# Patient Record
Sex: Female | Born: 1980 | Hispanic: Yes | Marital: Single | Smoking: Never smoker
Health system: Southern US, Community
[De-identification: ages and names within clinical notes are randomized; demographics above are authoritative.]

## PROBLEM LIST (undated history)

## (undated) ENCOUNTER — Inpatient Hospital Stay (HOSPITAL_COMMUNITY): Payer: Self-pay

## (undated) DIAGNOSIS — K219 Gastro-esophageal reflux disease without esophagitis: Secondary | ICD-10-CM

## (undated) HISTORY — PX: NO PAST SURGERIES: SHX2092

---

## 2011-02-18 ENCOUNTER — Encounter: Payer: Self-pay | Admitting: *Deleted

## 2011-02-18 ENCOUNTER — Emergency Department (INDEPENDENT_AMBULATORY_CARE_PROVIDER_SITE_OTHER): Payer: Self-pay

## 2011-02-18 ENCOUNTER — Emergency Department (HOSPITAL_BASED_OUTPATIENT_CLINIC_OR_DEPARTMENT_OTHER)
Admission: EM | Admit: 2011-02-18 | Discharge: 2011-02-18 | Disposition: A | Discharge status: Home / Self Care | Payer: Self-pay | Attending: Emergency Medicine | Admitting: Emergency Medicine

## 2011-02-18 DIAGNOSIS — W208XXA Other cause of strike by thrown, projected or falling object, initial encounter: Secondary | ICD-10-CM | POA: Insufficient documentation

## 2011-02-18 DIAGNOSIS — W19XXXA Unspecified fall, initial encounter: Secondary | ICD-10-CM

## 2011-02-18 DIAGNOSIS — S5000XA Contusion of unspecified elbow, initial encounter: Secondary | ICD-10-CM | POA: Insufficient documentation

## 2011-02-18 DIAGNOSIS — R609 Edema, unspecified: Secondary | ICD-10-CM

## 2011-02-18 DIAGNOSIS — M25529 Pain in unspecified elbow: Secondary | ICD-10-CM

## 2011-02-18 DIAGNOSIS — S5001XA Contusion of right elbow, initial encounter: Secondary | ICD-10-CM

## 2011-02-18 DIAGNOSIS — S20219A Contusion of unspecified front wall of thorax, initial encounter: Secondary | ICD-10-CM

## 2011-02-18 LAB — PREGNANCY, URINE: Preg Test, Ur: NEGATIVE

## 2011-02-18 MED ORDER — HYDROCODONE-ACETAMINOPHEN 5-325 MG PO TABS: 1.0000 | ORAL_TABLET | Freq: Four times a day (QID) | ORAL | Status: AC | PRN

## 2011-02-18 NOTE — ED Notes (Signed)
Pt fell approx 12hours ago, while sitting on a glass table that broke. Pt c/o right elbow pain.

## 2011-02-18 NOTE — ED Provider Notes (Signed)
History     CSN: [id number] Arrival date & time: 02/18/2011  1:58 AM  Chief Complaint  Patient presents with  . Elbow Injury   HPI This is a 30 year old Hispanic female who fell yesterday afternoon about 2 PM when the table collapsed. She is complaining of pain in her right elbow laterally as well as pain in her right upper back. She states she has a visible bruise on her right upper back. The pain her elbow is moderate to severe and worse with palpation or movement. The pain in her back is less severe. She denies other injury. She has been applying heat to the elbow without relief.  History reviewed. No pertinent past medical history.  History reviewed. No pertinent past surgical history.  No family history on file.  History  Substance Use Topics  . Smoking status: Never Smoker   . Smokeless tobacco: Not on file  . Alcohol Use: Yes     rarely    OB History    Grav Para Term Preterm Abortions TAB SAB Ect Mult Living                  Review of Systems  All other systems reviewed and are negative.    Physical Exam  BP 108/67  Pulse 73  Temp(Src) 97.9 F (36.6 C) (Oral)  Resp 18  SpO2 100%  LMP 12/20/2010  Physical Exam General: Well-developed, well-nourished female in no acute distress; appearance consistent with age of record HENT: normocephalic, atraumatic Eyes: pupils equal round and reactive to light; extraocular muscles intact Neck: supple; C-spine nontender Back: Right upper back ecchymosis with tenderness; no crepitus Heart: regular rate and rhythm Lungs: clear to auscultation bilaterally Abdomen: Soft nondistended Extremities: No deformity; full range of motion; tenderness over lateral aspect of right elbow without deformity; there is some mild edema; right upper extremity is distally neurovascularly intact Neurologic: Awake, alert and oriented;motor function intact in all extremities and symmetric Skin: Warm and dry Psychiatric: Normal mood and  affect   ED Course  Procedures  MDM Nursing notes and vitals signs, including pulse oximetry, reviewed.  Summary of this visit's results, reviewed by myself:  Labs:  Results for orders placed during the hospital encounter of 02/18/11  PREGNANCY, URINE      Component Value Range   Preg Test, Ur NEGATIVE      Imaging Studies: Dg Elbow Complete Right  02/18/2011  *RADIOLOGY REPORT*  Clinical Data: Right elbow pain, swelling, and bruising after fall 12 hours ago on glass table.  RIGHT ELBOW - COMPLETE 3+ VIEW  Comparison: None.  Findings: No evidence of acute fracture or subluxation.  No focal bone lesions.  Bone matrix and cortex appear intact.  No abnormal radiopaque densities in the soft tissues.  No significant effusion  IMPRESSION: No acute bony abnormalities demonstrated in the right elbow.  Original Report Authenticated By: Marlon Pel, M.D.          Hanley Seamen, MD 02/18/11 [phone]

## 2011-07-04 ENCOUNTER — Emergency Department (HOSPITAL_COMMUNITY)
Admission: EM | Admit: 2011-07-04 | Discharge: 2011-07-04 | Discharge status: Against Medical Advice | Payer: Self-pay | Attending: Emergency Medicine | Admitting: Emergency Medicine

## 2011-07-04 ENCOUNTER — Other Ambulatory Visit: Payer: Self-pay

## 2011-07-04 ENCOUNTER — Encounter (HOSPITAL_COMMUNITY): Payer: Self-pay | Admitting: *Deleted

## 2011-07-04 DIAGNOSIS — N63 Unspecified lump in unspecified breast: Secondary | ICD-10-CM | POA: Insufficient documentation

## 2011-07-04 DIAGNOSIS — N644 Mastodynia: Secondary | ICD-10-CM | POA: Insufficient documentation

## 2011-07-04 DIAGNOSIS — R0602 Shortness of breath: Secondary | ICD-10-CM | POA: Insufficient documentation

## 2011-09-07 ENCOUNTER — Other Ambulatory Visit: Payer: Self-pay | Admitting: Family Medicine

## 2011-09-07 DIAGNOSIS — N63 Unspecified lump in unspecified breast: Secondary | ICD-10-CM

## 2011-09-14 ENCOUNTER — Ambulatory Visit
Admission: RE | Admit: 2011-09-14 | Discharge: 2011-09-14 | Disposition: A | Discharge status: Home / Self Care | Payer: Self-pay | Source: Ambulatory Visit | Attending: Family Medicine | Admitting: Family Medicine

## 2011-09-14 DIAGNOSIS — N63 Unspecified lump in unspecified breast: Secondary | ICD-10-CM

## 2011-09-14 NOTE — Discharge Instructions (Signed)
Breast Biopsy Care After These instructions give you information on caring for yourself after your procedure. Your doctor may also give you more specific instructions. Call your doctor if you have any problems or questions after your procedure. HOME CARE  Change any bandages (dressings) as told by your doctor.   Only take sponge baths during the first 2 to 3 days. Keep your bandage dry.   Wear a bra all day and night until you see your doctor again.   Eat a healthy diet.   Do not exercise, drive, lift, or do activities without your doctor's permission.   Only take medicine as told by your doctor. Do not take aspirin. Do not drink alcohol while taking pain medicine.   Protect the biopsy area. Do not let the area get bumped.   Keep all follow-up appointments.  Finding out the results of your test Ask when your test results will be ready. Make sure you get your test results. GET HELP RIGHT AWAY IF:   You have trouble breathing.   You have redness and puffiness (swelling) around the biopsy site.   You notice a bad smell coming from the biopsy site.   You are bleeding more from your biopsy site.   You have yellowish white fluid (pus) coming from the biopsy site.   The biopsy site is opening.   You get a rash.   You need stronger pain medicine.   You think you are reacting badly to your medicine.   You have a fever.  MAKE SURE YOU:  Understand these instructions.   Will watch your condition.   Will get help right away if you are not doing well or get worse.  Document Released: 04/01/2009 Document Revised: 05/25/2011 Document Reviewed: 04/01/2009 [employer] Patient Information 2012 [employer]. 

## 2012-03-17 ENCOUNTER — Inpatient Hospital Stay (HOSPITAL_COMMUNITY)
Admission: AD | Admit: 2012-03-17 | Discharge: 2012-03-17 | Disposition: A | Discharge status: Home / Self Care | Payer: Medicaid Other | Source: Ambulatory Visit | Attending: Obstetrics & Gynecology | Admitting: Obstetrics & Gynecology

## 2012-03-17 ENCOUNTER — Encounter (HOSPITAL_COMMUNITY): Payer: Self-pay | Admitting: *Deleted

## 2012-03-17 DIAGNOSIS — O99891 Other specified diseases and conditions complicating pregnancy: Secondary | ICD-10-CM | POA: Insufficient documentation

## 2012-03-17 DIAGNOSIS — R109 Unspecified abdominal pain: Secondary | ICD-10-CM | POA: Insufficient documentation

## 2012-03-17 DIAGNOSIS — O26899 Other specified pregnancy related conditions, unspecified trimester: Secondary | ICD-10-CM

## 2012-03-17 LAB — URINALYSIS, ROUTINE W REFLEX MICROSCOPIC
Bilirubin Urine: NEGATIVE
Ketones, ur: NEGATIVE mg/dL
Leukocytes, UA: NEGATIVE
Nitrite: NEGATIVE
Specific Gravity, Urine: >1.03 — ABNORMAL HIGH (ref 1.005–1.030)
Urobilinogen, UA: 0.2 mg/dL (ref 0.0–1.0)
pH: 6 (ref 5.0–8.0)

## 2012-03-17 LAB — CBC WITH DIFFERENTIAL/PLATELET
Basophils Absolute: 0 10*3/uL (ref 0.0–0.1)
Basophils Relative: 0 % (ref 0–1)
Eosinophils Absolute: 0.1 10*3/uL (ref 0.0–0.7)
MCH: 28.4 pg (ref 26.0–34.0)
MCHC: 33.8 g/dL (ref 30.0–36.0)
Neutro Abs: 5.5 10*3/uL (ref 1.7–7.7)
Neutrophils Relative %: 59 % (ref 43–77)
Platelets: 257 10*3/uL (ref 150–400)
RDW: 13.7 % (ref 11.5–15.5)

## 2012-03-17 LAB — WET PREP, GENITAL
Clue Cells Wet Prep HPF POC: NONE SEEN
Trich, Wet Prep: NONE SEEN

## 2012-03-17 LAB — URINE MICROSCOPIC-ADD ON

## 2012-03-17 NOTE — MAU Provider Note (Signed)
History     CSN: [id number]  Arrival date and time: 03/17/12 2017   None     Chief Complaint  Patient presents with  . Abdominal Pain   HPI Victoria Schneider is a 31 y.o. female @ [redacted]w[redacted]d gestation who presents to MAU with abdominal pain.  The pain started a few weeks ago. She describes the pain as feeling like a period pain. She rates the pain as 5/10. The pain comes and goes. Associated symptoms include nausea and vomiting in the mornings and heart burn.  Denies vaginal bleeding or discharge. Last sexual intercourse 2 months ago. No history of STI's.  Last pap smear less than one year ago and was normal. Planning prenatal care at [facility] but has not started yet. The history was provided by the patient.  OB History    Grav Para Term Preterm Abortions TAB SAB Ect Mult Living   3 2 2       2       History reviewed. No pertinent past medical history.  History reviewed. No pertinent past surgical history.  History reviewed. No pertinent family history.  History  Substance Use Topics  . Smoking status: Never Smoker   . Smokeless tobacco: Not on file  . Alcohol Use: No     rarely    Allergies: No Known Allergies  Prescriptions prior to admission  Medication Sig Dispense Refill  . acetaminophen (TYLENOL) 325 MG tablet Take 650 mg by mouth every 6 (six) hours as needed. For pain/headache      . calcium carbonate (TUMS - DOSED IN MG ELEMENTAL CALCIUM) 500 MG chewable tablet Chew 1 tablet by mouth daily. For heartburn      . Prenatal Vit-Fe Fumarate-FA (PRENATAL MULTIVITAMIN) TABS Take 1 tablet by mouth daily.        Review of Systems  Constitutional: Negative for fever, chills and weight loss.  HENT: Negative for ear pain, nosebleeds, congestion, sore throat and neck pain.   Eyes: Negative for blurred vision, double vision, photophobia and pain.  Respiratory: Negative for cough, shortness of breath and wheezing.   Cardiovascular: Negative for chest pain, palpitations and leg  swelling.  Gastrointestinal: Positive for heartburn, nausea, vomiting and abdominal pain. Negative for diarrhea and constipation.  Genitourinary: Negative for dysuria, urgency and frequency.  Musculoskeletal: Negative for myalgias and back pain.  Skin: Negative for itching and rash.  Neurological: Negative for dizziness, sensory change, speech change, seizures, weakness and headaches.  Endo/Heme/Allergies: Does not bruise/bleed easily.  Psychiatric/Behavioral: Negative for depression. The patient is not nervous/anxious and does not have insomnia.    Physical Exam   Temperature 98.7 F (37.1 C), temperature source Oral, resp. rate 18, height 5' 5.5" (1.664 m), weight 183 lb (83.008 kg), last menstrual period 12/19/2011.  Physical Exam  Nursing note and vitals reviewed. Constitutional: She is oriented to person, place, and time. She appears well-developed and well-nourished. No distress.  HENT:  Head: Normocephalic and atraumatic.  Eyes: EOM are normal.  Neck: Neck supple.  Cardiovascular: Normal rate.   Respiratory: Effort normal.  GI: Soft. There is tenderness.       Mildly tender bilateral abdomen, no guarding or rebound. Positive FHT's.  Genitourinary:       External genitalia without lesions. White discharge vaginal vault. Cervix closed, long, no CMT. Uterus 12 to 14 week size.    Musculoskeletal: Normal range of motion.  Neurological: She is alert and oriented to person, place, and time.  Skin: Skin is warm and  dry.  Psychiatric: She has a normal mood and affect. Her behavior is normal. Judgment and thought content normal.   Procedures  Results for orders placed during the hospital encounter of 03/17/12 (from the past 24 hour(s))  URINALYSIS, ROUTINE W REFLEX MICROSCOPIC     Status: Abnormal   Collection Time   03/17/12  8:26 PM      Component Value Range   Color, Urine YELLOW  YELLOW   APPearance CLEAR  CLEAR   Specific Gravity, Urine >1.030 (*) 1.005 - 1.030   pH 6.0   5.0 - 8.0   Glucose, UA NEGATIVE  NEGATIVE mg/dL   Hgb urine dipstick SMALL (*) NEGATIVE   Bilirubin Urine NEGATIVE  NEGATIVE   Ketones, ur NEGATIVE  NEGATIVE mg/dL   Protein, ur NEGATIVE  NEGATIVE mg/dL   Urobilinogen, UA 0.2  0.0 - 1.0 mg/dL   Nitrite NEGATIVE  NEGATIVE   Leukocytes, UA NEGATIVE  NEGATIVE  URINE MICROSCOPIC-ADD ON     Status: Normal   Collection Time   03/17/12  8:26 PM      Component Value Range   Squamous Epithelial / LPF RARE  RARE   RBC / HPF 0-2  <3 RBC/hpf   Bacteria, UA RARE  RARE   Urine-Other MUCOUS PRESENT    POCT PREGNANCY, URINE     Status: Abnormal   Collection Time   03/17/12  8:32 PM      Component Value Range   Preg Test, Ur POSITIVE (*) NEGATIVE  WET PREP, GENITAL     Status: Abnormal   Collection Time   03/17/12  9:42 PM      Component Value Range   Yeast Wet Prep HPF POC NONE SEEN  NONE SEEN   Trich, Wet Prep NONE SEEN  NONE SEEN   Clue Cells Wet Prep HPF POC NONE SEEN  NONE SEEN   WBC, Wet Prep HPF POC TOO NUMEROUS TO COUNT (*) NONE SEEN  CBC WITH DIFFERENTIAL     Status: Abnormal   Collection Time   03/17/12  9:54 PM      Component Value Range   WBC 9.5  4.0 - 10.5 K/uL   RBC 4.15  3.87 - 5.11 MIL/uL   Hemoglobin 11.8 (*) 12.0 - 15.0 g/dL   HCT 16.1 (*) 09.6 - 04.5 %   MCV 84.1  78.0 - 100.0 fL   MCH 28.4  26.0 - 34.0 pg   MCHC 33.8  30.0 - 36.0 g/dL   RDW 40.9  81.1 - 91.4 %   Platelets 257  150 - 400 K/uL   Neutrophils Relative 59  43 - 77 %   Neutro Abs 5.5  1.7 - 7.7 K/uL   Lymphocytes Relative 31  12 - 46 %   Lymphs Abs 3.0  0.7 - 4.0 K/uL   Monocytes Relative 9  3 - 12 %   Monocytes Absolute 0.9  0.1 - 1.0 K/uL   Eosinophils Relative 1  0 - 5 %   Eosinophils Absolute 0.1  0.0 - 0.7 K/uL   Basophils Relative 0  0 - 1 %   Basophils Absolute 0.0  0.0 - 0.1 K/uL   Assessment: 31 y.o. female @ [redacted]w[redacted]d gestation with abdominal pain   Round ligament pain  Plan:  Tylenol as needed for discomfort   Continue prenatal care at  [facility], return as needed  Informal bedside ultrasound shows active IUP. Patient able to visualize cardiac activity.  Discussed with the patient and  all questioned fully answered. She will return here if any problems arise.   Medication List     As of 03/19/2012  8:23 AM    CONTINUE taking these medications         acetaminophen 325 MG tablet   Commonly known as: TYLENOL      calcium carbonate 500 MG chewable tablet   Commonly known as: TUMS - dosed in mg elemental calcium      prenatal multivitamin Tabs         NEESE,HOPE, RN, FNP, BC 03/17/2012, 10:45 PM

## 2012-03-17 NOTE — MAU Note (Signed)
Stomachl pain like "im going to start my period" no vaginal bleeding

## 2012-03-19 LAB — GC/CHLAMYDIA PROBE AMP, GENITAL
Chlamydia, DNA Probe: NEGATIVE
GC Probe Amp, Genital: NEGATIVE

## 2012-03-19 NOTE — MAU Provider Note (Signed)
Medical Screening exam and patient care preformed by advanced practice provider.  Agree with the above management.  

## 2012-06-19 NOTE — L&D Delivery Note (Signed)
Delivery Note At 4:39 AM a viable female was delivered via  (Presentation: ;  ).  APGAR: , ; weight .   Placenta status: , .  Cord:  with the following complications: .  Cord pH: not done  Anesthesia: Epidural  Episiotomy:  Lacerations:  Suture Repair: 2.0 vicryl Est. Blood Loss (mL):   Mom to postpartum.  Baby to nursery-stable.  Shawnna Pancake A 10/01/2012, 4:47 AM

## 2012-07-10 ENCOUNTER — Encounter (HOSPITAL_COMMUNITY): Payer: Self-pay | Admitting: *Deleted

## 2012-07-10 ENCOUNTER — Inpatient Hospital Stay (HOSPITAL_COMMUNITY)
Admission: AD | Admit: 2012-07-10 | Discharge: 2012-07-11 | Disposition: A | Discharge status: Home / Self Care | Payer: Medicaid Other | Source: Ambulatory Visit | Attending: Obstetrics & Gynecology | Admitting: Obstetrics & Gynecology

## 2012-07-10 DIAGNOSIS — O2390 Unspecified genitourinary tract infection in pregnancy, unspecified trimester: Secondary | ICD-10-CM | POA: Insufficient documentation

## 2012-07-10 DIAGNOSIS — B373 Candidiasis of vulva and vagina: Secondary | ICD-10-CM

## 2012-07-10 DIAGNOSIS — B3731 Acute candidiasis of vulva and vagina: Secondary | ICD-10-CM | POA: Insufficient documentation

## 2012-07-10 DIAGNOSIS — N949 Unspecified condition associated with female genital organs and menstrual cycle: Secondary | ICD-10-CM | POA: Insufficient documentation

## 2012-07-10 DIAGNOSIS — L293 Anogenital pruritus, unspecified: Secondary | ICD-10-CM | POA: Insufficient documentation

## 2012-07-10 MED ORDER — FLUCONAZOLE 150 MG PO TABS: 150.0000 mg | ORAL_TABLET | Freq: Once | ORAL | Status: DC

## 2012-07-10 MED ORDER — FLUCONAZOLE 150 MG PO TABS
150.0000 mg | ORAL_TABLET | ORAL | Status: AC
  Administered 2012-07-11: 150 mg via ORAL
  Filled 2012-07-10: qty 1

## 2012-07-10 NOTE — Progress Notes (Signed)
Pt states she feels pressure with the baby movements

## 2012-07-10 NOTE — MAU Note (Signed)
Pt states she has discharge that started yesterday and pt states she has some discomfort and now has itching. Pt also states she has not felt the baby move"that much for about  3 days "

## 2012-07-10 NOTE — MAU Provider Note (Signed)
Chief Complaint:  Vaginal Discharge   First Provider Initiated Contact with Patient 07/10/12 2334      HPI: Victoria Schneider is a 32 y.o. G3P2002 at [redacted]w[redacted]d who presents to maternity admissions reporting vaginal discharge with itching and decreased fetal movement over last 3 days.  She reports good fetal movement since arrival in MAU, denies LOF, vaginal bleeding, urinary symptoms, h/a, dizziness, n/v, or fever/chills.     Past Medical History: History reviewed. No pertinent past medical history.  Past obstetric history: OB History    Grav Para Term Preterm Abortions TAB SAB Ect Mult Living   3 2 2       2      # Outc Date GA Lbr Len/2nd Wgt Sex Del Anes PTL Lv   1 TRM      SVD      2 TRM      SVD      3 CUR               Past Surgical History: History reviewed. No pertinent past surgical history.  Family History: History reviewed. No pertinent family history.  Social History: History  Substance Use Topics  . Smoking status: Never Smoker   . Smokeless tobacco: Not on file  . Alcohol Use: No     Comment: rarely    Allergies: No Known Allergies  Meds:  Prescriptions prior to admission  Medication Sig Dispense Refill  . acetaminophen (TYLENOL) 325 MG tablet Take 650 mg by mouth every 6 (six) hours as needed. For pain/headache      . calcium carbonate (TUMS - DOSED IN MG ELEMENTAL CALCIUM) 500 MG chewable tablet Chew 1 tablet by mouth daily. For heartburn      . Prenatal Vit-Fe Fumarate-FA (PRENATAL MULTIVITAMIN) TABS Take 1 tablet by mouth daily.        ROS: Pertinent findings in history of present illness.  Physical Exam  Blood pressure 107/59, pulse 85, temperature 97.9 F (36.6 C), temperature source Oral, resp. rate 20, height 5\' 3"  (1.6 m), weight 87.544 kg (193 lb), last menstrual period 12/19/2011, SpO2 100.00%. GENERAL: Well-developed, well-nourished female in no acute distress.  HEENT: normocephalic HEART: normal rate RESP: normal effort ABDOMEN: Soft,  non-tender, gravid appropriate for gestational age EXTREMITIES: Nontender, no edema NEURO: alert and oriented Pelvic exam: Cervix pink, visually closed, without lesion, moderate amount white clumpy discharge clinging to vaginal walls, vaginal walls and external genitalia with mild erythema Cervix 0/long/high, posterior    FHT:  Baseline 135 , moderate variability, accelerations present (15x15), no decelerations Contractions: None on toco or by palpation   Labs: Results for orders placed during the hospital encounter of 07/10/12 (from the past 24 hour(s))  WET PREP, GENITAL     Status: Abnormal   Collection Time   07/10/12 11:44 PM      Component Value Range   Yeast Wet Prep HPF POC NONE SEEN  NONE SEEN   Trich, Wet Prep NONE SEEN  NONE SEEN   Clue Cells Wet Prep HPF POC FEW (*) NONE SEEN   WBC, Wet Prep HPF POC FEW (*) NONE SEEN      Assessment: 1. Vaginal candida     Plan: Diflucan x1 dose in MAU Topical clotrimazole in MAU and pt to take home for external use Discharge home Labor precautions and fetal kick counts F/U with Dr Tamela Oddi as scheduled in February Return to MAU as needed    Medication List     As of 07/10/2012  11:44 PM    ASK your doctor about these medications         acetaminophen 325 MG tablet   Commonly known as: TYLENOL   Take 650 mg by mouth every 6 (six) hours as needed. For pain/headache      calcium carbonate 500 MG chewable tablet   Commonly known as: TUMS - dosed in mg elemental calcium   Chew 1 tablet by mouth daily. For heartburn      prenatal multivitamin Tabs   Take 1 tablet by mouth daily.        Sharen Counter Certified Nurse-Midwife 07/10/2012 11:44 PM

## 2012-07-10 NOTE — MAU Note (Signed)
Pt reports vaginal "pain" yesterday and today has vaginal discharge and vaginal itching. Reports discharge is "clear, white". Also reports baby is moving less over the last few days.

## 2012-07-11 DIAGNOSIS — B373 Candidiasis of vulva and vagina: Secondary | ICD-10-CM

## 2012-07-11 LAB — WET PREP, GENITAL
Trich, Wet Prep: NONE SEEN
Yeast Wet Prep HPF POC: NONE SEEN

## 2012-07-11 MED ORDER — CLOTRIMAZOLE 1 % EX CREA
TOPICAL_CREAM | Freq: Once | CUTANEOUS | Status: DC
  Filled 2012-07-11: qty 15

## 2012-07-15 ENCOUNTER — Inpatient Hospital Stay (HOSPITAL_COMMUNITY)
Admission: AD | Admit: 2012-07-15 | Discharge: 2012-07-16 | Disposition: A | Discharge status: Home / Self Care | Payer: Medicaid Other | Source: Ambulatory Visit | Attending: Obstetrics & Gynecology | Admitting: Obstetrics & Gynecology

## 2012-07-15 ENCOUNTER — Encounter (HOSPITAL_COMMUNITY): Payer: Self-pay

## 2012-07-15 DIAGNOSIS — K219 Gastro-esophageal reflux disease without esophagitis: Secondary | ICD-10-CM | POA: Insufficient documentation

## 2012-07-15 DIAGNOSIS — R109 Unspecified abdominal pain: Secondary | ICD-10-CM | POA: Insufficient documentation

## 2012-07-15 DIAGNOSIS — O99891 Other specified diseases and conditions complicating pregnancy: Secondary | ICD-10-CM | POA: Insufficient documentation

## 2012-07-15 MED ORDER — FAMOTIDINE 20 MG PO TABS
20.0000 mg | ORAL_TABLET | Freq: Once | ORAL | Status: AC
  Administered 2012-07-16: 20 mg via ORAL
  Filled 2012-07-15: qty 1

## 2012-07-15 MED ORDER — GI COCKTAIL ~~LOC~~
30.0000 mL | Freq: Once | ORAL | Status: AC
  Administered 2012-07-15: 30 mL via ORAL
  Filled 2012-07-15: qty 30

## 2012-07-15 MED ORDER — ONDANSETRON 8 MG PO TBDP
8.0000 mg | ORAL_TABLET | Freq: Once | ORAL | Status: AC
  Administered 2012-07-15: 8 mg via ORAL
  Filled 2012-07-15: qty 1

## 2012-07-15 NOTE — MAU Note (Signed)
Pt given GI cocktail. Pt immediately vomited. Dr. Paulina Fusi notified

## 2012-07-15 NOTE — MAU Provider Note (Signed)
Chief Complaint:  Nausea and reflux    First Provider Initiated Contact with Patient 07/15/12 2307      HPI: Victoria Schneider is a 32 y.o. G3P2002 at [redacted]w[redacted]d who presents to maternity admissions reporting worsening abdominal pain in the epigastric region when eating.  Pt states this began a couple day ago and she presented to MAU at that time and dx with reflux.  Pt continued to have these symptoms including epigastric pain, burning 1-2 hrs after eating since then and has tried to tums to no avail.  Also, pt is unable to determine if pain is worse when laying down and does not c/o brash in the back of her mouth.  Denies CP, SOB, dizziness.  Denies contractions, leakage of fluid or vaginal bleeding. Good fetal movement.  Past Medical History: History reviewed. No pertinent past medical history.  Past obstetric history: OB History    Grav Para Term Preterm Abortions TAB SAB Ect Mult Living   3 2 2       2      # Outc Date GA Lbr Len/2nd Wgt Sex Del Anes PTL Lv   1 TRM      SVD      2 TRM      SVD      3 CUR               Past Surgical History: No past surgical history on file.  Family History: No family history on file.  Social History: History  Substance Use Topics  . Smoking status: Never Smoker   . Smokeless tobacco: Not on file  . Alcohol Use: No     Comment: rarely    Allergies: No Known Allergies  Meds:  Prescriptions prior to admission  Medication Sig Dispense Refill  . acetaminophen (TYLENOL) 325 MG tablet Take 650 mg by mouth every 6 (six) hours as needed. For pain/headache      . calcium carbonate (TUMS - DOSED IN MG ELEMENTAL CALCIUM) 500 MG chewable tablet Chew 1 tablet by mouth daily. For heartburn      . Prenatal Vit-Fe Fumarate-FA (PRENATAL MULTIVITAMIN) TABS Take 1 tablet by mouth daily.        ROS: Pertinent findings in history of present illness.  Physical Exam  Last menstrual period 12/19/2011. GENERAL: Well-developed, well-nourished female in no  acute distress.  HEENT: normocephalic HEART: RRR RESP: CTAB ABDOMEN: TTP epigastric region, Soft, gravid appropriate for gestational age EXTREMITIES: Nontender, no edema NEURO: alert and oriented  Labs: No results found for this or any previous visit (from the past 24 hour(s)).  Imaging:  No results found. MAU Course: 1) Will give GI cocktail and see how this works   1) Pt vomited this up, will give Zofran ODT 8 mg x 1 and also Pepcid AC x 1. Pt did well with this and will d/c home.    Assessment: 1. Gastroesophageal reflux in pregancy     Plan: Discharge home Labor precautions and fetal kick counts Will give Pepcid for PPx during pregnancy     Medication List     As of 07/16/2012 12:43 AM    TAKE these medications         acetaminophen 325 MG tablet   Commonly known as: TYLENOL   Take 650 mg by mouth every 6 (six) hours as needed. For pain/headache      calcium carbonate 500 MG chewable tablet   Commonly known as: TUMS - dosed in mg elemental calcium  Chew 1 tablet by mouth daily. For heartburn      famotidine 10 MG chewable tablet   Commonly known as: PEPCID AC   Chew 1 tablet (10 mg total) by mouth 2 (two) times daily.      prenatal multivitamin Tabs   Take 1 tablet by mouth daily.         Briscoe Deutscher, DO 07/15/2012 11:07 PM  I have seen the patient with the resident and agree with the above.

## 2012-07-16 DIAGNOSIS — K219 Gastro-esophageal reflux disease without esophagitis: Secondary | ICD-10-CM

## 2012-07-16 DIAGNOSIS — O9989 Other specified diseases and conditions complicating pregnancy, childbirth and the puerperium: Secondary | ICD-10-CM

## 2012-07-16 MED ORDER — FAMOTIDINE 10 MG PO CHEW: 10.0000 mg | CHEWABLE_TABLET | Freq: Two times a day (BID) | ORAL | Status: DC

## 2012-07-16 NOTE — MAU Provider Note (Signed)
Attestation of Attending Supervision of Advanced Practitioner (CNM/NP): Evaluation and management procedures were performed by the Advanced Practitioner under my supervision and collaboration. I have reviewed the Advanced Practitioner's note and chart, and I agree with the management and plan.  Tarren Sabree H. 4:44 AM

## 2012-07-25 ENCOUNTER — Other Ambulatory Visit: Payer: Self-pay | Admitting: Obstetrics & Gynecology

## 2012-07-25 DIAGNOSIS — Z0374 Encounter for suspected problem with fetal growth ruled out: Secondary | ICD-10-CM

## 2012-08-01 ENCOUNTER — Ambulatory Visit (HOSPITAL_COMMUNITY): Admission: RE | Admit: 2012-08-01 | Payer: Medicaid Other | Source: Ambulatory Visit

## 2012-08-08 ENCOUNTER — Ambulatory Visit (HOSPITAL_COMMUNITY)
Admission: RE | Admit: 2012-08-08 | Discharge: 2012-08-08 | Disposition: A | Discharge status: Home / Self Care | Payer: Medicaid Other | Source: Ambulatory Visit | Attending: Obstetrics & Gynecology | Admitting: Obstetrics & Gynecology

## 2012-08-08 ENCOUNTER — Other Ambulatory Visit: Payer: Self-pay | Admitting: Obstetrics & Gynecology

## 2012-08-08 DIAGNOSIS — O365990 Maternal care for other known or suspected poor fetal growth, unspecified trimester, not applicable or unspecified: Secondary | ICD-10-CM | POA: Insufficient documentation

## 2012-08-08 DIAGNOSIS — Z3689 Encounter for other specified antenatal screening: Secondary | ICD-10-CM | POA: Insufficient documentation

## 2012-08-08 DIAGNOSIS — Z0374 Encounter for suspected problem with fetal growth ruled out: Secondary | ICD-10-CM

## 2012-08-26 ENCOUNTER — Inpatient Hospital Stay (HOSPITAL_COMMUNITY)
Admission: AD | Admit: 2012-08-26 | Discharge: 2012-08-26 | Disposition: A | Discharge status: Home / Self Care | Payer: Medicaid Other | Source: Ambulatory Visit | Attending: Obstetrics & Gynecology | Admitting: Obstetrics & Gynecology

## 2012-08-26 ENCOUNTER — Encounter (HOSPITAL_COMMUNITY): Payer: Self-pay

## 2012-08-26 DIAGNOSIS — R109 Unspecified abdominal pain: Secondary | ICD-10-CM | POA: Insufficient documentation

## 2012-08-26 DIAGNOSIS — O212 Late vomiting of pregnancy: Secondary | ICD-10-CM | POA: Insufficient documentation

## 2012-08-26 DIAGNOSIS — A084 Viral intestinal infection, unspecified: Secondary | ICD-10-CM

## 2012-08-26 DIAGNOSIS — R197 Diarrhea, unspecified: Secondary | ICD-10-CM | POA: Insufficient documentation

## 2012-08-26 DIAGNOSIS — O99891 Other specified diseases and conditions complicating pregnancy: Secondary | ICD-10-CM | POA: Insufficient documentation

## 2012-08-26 DIAGNOSIS — A088 Other specified intestinal infections: Secondary | ICD-10-CM | POA: Insufficient documentation

## 2012-08-26 LAB — COMPREHENSIVE METABOLIC PANEL
ALT: 10 U/L (ref 0–35)
AST: 31 U/L (ref 0–37)
Albumin: 2.7 g/dL — ABNORMAL LOW (ref 3.5–5.2)
Calcium: 9.2 mg/dL (ref 8.4–10.5)
Creatinine, Ser: 0.46 mg/dL — ABNORMAL LOW (ref 0.50–1.10)
Sodium: 132 mEq/L — ABNORMAL LOW (ref 135–145)

## 2012-08-26 LAB — URINE MICROSCOPIC-ADD ON

## 2012-08-26 LAB — URINALYSIS, ROUTINE W REFLEX MICROSCOPIC
Bilirubin Urine: NEGATIVE
Glucose, UA: NEGATIVE mg/dL
Ketones, ur: 40 mg/dL — AB
Nitrite: NEGATIVE
Specific Gravity, Urine: 1.015 (ref 1.005–1.030)
pH: 6 (ref 5.0–8.0)

## 2012-08-26 LAB — CBC
MCH: 25.1 pg — ABNORMAL LOW (ref 26.0–34.0)
MCV: 78.8 fL (ref 78.0–100.0)
Platelets: 263 10*3/uL (ref 150–400)
RDW: 13.8 % (ref 11.5–15.5)
WBC: 10 10*3/uL (ref 4.0–10.5)

## 2012-08-26 MED ORDER — ONDANSETRON HCL 4 MG PO TABS: 4.0000 mg | ORAL_TABLET | Freq: Every day | ORAL | Status: DC | PRN

## 2012-08-26 MED ORDER — ONDANSETRON HCL 4 MG/2ML IJ SOLN
4.0000 mg | INTRAMUSCULAR | Status: AC
  Administered 2012-08-26: 4 mg via INTRAVENOUS
  Filled 2012-08-26: qty 2

## 2012-08-26 MED ORDER — PROMETHAZINE HCL 25 MG PO TABS: 12.5000 mg | ORAL_TABLET | Freq: Four times a day (QID) | ORAL | Status: DC | PRN

## 2012-08-26 MED ORDER — PROMETHAZINE HCL 25 MG/ML IJ SOLN
25.0000 mg | INTRAVENOUS | Status: AC
  Administered 2012-08-26: 25 mg via INTRAVENOUS
  Filled 2012-08-26: qty 1

## 2012-08-26 MED ORDER — LOPERAMIDE HCL 2 MG PO TABS: 2.0000 mg | ORAL_TABLET | Freq: Four times a day (QID) | ORAL | Status: DC | PRN

## 2012-08-26 NOTE — MAU Note (Signed)
Patient states she has been having abdominal pain, diarrhea and vomiting since 1100, multiple episodes. Reports good fetal movement, no bleeding, contractions or leaking.

## 2012-08-26 NOTE — MAU Provider Note (Signed)
Chief Complaint:  Abdominal Pain, Emesis and Diarrhea   First Provider Initiated Contact with Patient 08/26/12 1811      HPI: Victoria Schneider is a 32 y.o. G3P2002 at [redacted]w[redacted]d who presents to maternity admissions reporting abdominal cramping, n/v, and diarrhea x24 hours.  She reports emesis ~10 times in last 24 hours with 4-5 episodes of diarrhea.  She reports abdominal cramping occurred first, but reports this does not feel like uterine contractions, but more like bowel pain because she is sick. She reports good fetal movement, denies LOF, vaginal bleeding, vaginal itching/burning, urinary symptoms, h/a, dizziness, or fever/chills.  She also denies recent exposure to illness or other family members with similar symptoms.    Past Medical History: No past medical history on file.  Past obstetric history: OB History   Grav Para Term Preterm Abortions TAB SAB Ect Mult Living   3 2 2       2      # Outc Date GA Lbr Len/2nd Wgt Sex Del Anes PTL Lv   1 TRM      SVD      2 TRM      SVD      3 CUR               Past Surgical History: No past surgical history on file.  Family History: No family history on file.  Social History: History  Substance Use Topics  . Smoking status: Never Smoker   . Smokeless tobacco: Not on file  . Alcohol Use: No     Comment: rarely    Allergies: No Known Allergies  Meds:  Prescriptions prior to admission  Medication Sig Dispense Refill  . acetaminophen (TYLENOL) 500 MG tablet Take 1,000 mg by mouth every 6 (six) hours as needed for pain (For headache.).      Marland Kitchen calcium carbonate (TUMS - DOSED IN MG ELEMENTAL CALCIUM) 500 MG chewable tablet Chew 1 tablet by mouth daily. For heartburn      . pantoprazole (PROTONIX) 40 MG tablet Take 40 mg by mouth daily.      . Prenatal Vit-Fe Fumarate-FA (PRENATAL MULTIVITAMIN) TABS Take 1 tablet by mouth daily.        ROS: Pertinent findings in history of present illness.  Physical Exam  Blood pressure 111/66,  pulse 92, temperature 98 F (36.7 C), temperature source Oral, resp. rate 20, height 5\' 4"  (1.626 m), weight 87.272 kg (192 lb 6.4 oz), last menstrual period 12/19/2011, SpO2 100.00%. GENERAL: Well-developed, well-nourished female in no acute distress.  HEENT: normocephalic HEART: normal rate RESP: normal effort ABDOMEN: Soft, non-tender, gravid appropriate for gestational age EXTREMITIES: Nontender, no edema NEURO: alert and oriented SPECULUM EXAM: Deferred      FHT:  Baseline 135 , moderate variability, accelerations present, no decelerations Contractions: occasional, palpate mild   Labs: Results for orders placed during the hospital encounter of 08/26/12 (from the past 24 hour(s))  URINALYSIS, ROUTINE W REFLEX MICROSCOPIC     Status: Abnormal   Collection Time    08/26/12  5:30 PM      Result Value Range   Color, Urine YELLOW  YELLOW   APPearance CLEAR  CLEAR   Specific Gravity, Urine 1.015  1.005 - 1.030   pH 6.0  5.0 - 8.0   Glucose, UA NEGATIVE  NEGATIVE mg/dL   Hgb urine dipstick NEGATIVE  NEGATIVE   Bilirubin Urine NEGATIVE  NEGATIVE   Ketones, ur 40 (*) NEGATIVE mg/dL   Protein, ur NEGATIVE  NEGATIVE mg/dL   Urobilinogen, UA 0.2  0.0 - 1.0 mg/dL   Nitrite NEGATIVE  NEGATIVE   Leukocytes, UA TRACE (*) NEGATIVE  URINE MICROSCOPIC-ADD ON     Status: Abnormal   Collection Time    08/26/12  5:30 PM      Result Value Range   Squamous Epithelial / LPF MANY (*) RARE   WBC, UA 0-2  <3 WBC/hpf   Bacteria, UA FEW (*) RARE   Urine-Other FEW YEAST    CBC     Status: Abnormal   Collection Time    08/26/12  6:15 PM      Result Value Range   WBC 10.0  4.0 - 10.5 K/uL   RBC 3.87  3.87 - 5.11 MIL/uL   Hemoglobin 9.7 (*) 12.0 - 15.0 g/dL   HCT 57.8 (*) 46.9 - 62.9 %   MCV 78.8  78.0 - 100.0 fL   MCH 25.1 (*) 26.0 - 34.0 pg   MCHC 31.8  30.0 - 36.0 g/dL   RDW 52.8  41.3 - 24.4 %   Platelets 263  150 - 400 K/uL  COMPREHENSIVE METABOLIC PANEL     Status: Abnormal    Collection Time    08/26/12  6:15 PM      Result Value Range   Sodium 132 (*) 135 - 145 mEq/L   Potassium 3.7  3.5 - 5.1 mEq/L   Chloride 101  96 - 112 mEq/L   CO2 21  19 - 32 mEq/L   Glucose, Bld 76  70 - 99 mg/dL   BUN 4 (*) 6 - 23 mg/dL   Creatinine, Ser 0.10 (*) 0.50 - 1.10 mg/dL   Calcium 9.2  8.4 - 27.2 mg/dL   Total Protein 7.3  6.0 - 8.3 g/dL   Albumin 2.7 (*) 3.5 - 5.2 g/dL   AST 31  0 - 37 U/L   ALT 10  0 - 35 U/L   Alkaline Phosphatase 198 (*) 39 - 117 U/L   Total Bilirubin 0.5  0.3 - 1.2 mg/dL   GFR calc non Af Amer >90  >90 mL/min   GFR calc Af Amer >90  >90 mL/min     Assessment: 1. Viral gastroenteritis     Plan: D5LR x1000 ml with Phenergan 25 mg, Zofran 4 mg IV in MAU Discharge home PTL precautions Phenergan 12.5-25 mg PO, Zofran 4 mg PO, Imodium sent to pt pharmacy Drink plenty of fluids F/U with Tamela Oddi Return to MAU as needed     Medication List    ASK your doctor about these medications       acetaminophen 500 MG tablet  Commonly known as:  TYLENOL  Take 1,000 mg by mouth every 6 (six) hours as needed for pain (For headache.).     calcium carbonate 500 MG chewable tablet  Commonly known as:  TUMS - dosed in mg elemental calcium  Chew 1 tablet by mouth daily. For heartburn     pantoprazole 40 MG tablet  Commonly known as:  PROTONIX  Take 40 mg by mouth daily.     prenatal multivitamin Tabs  Take 1 tablet by mouth daily.        Sharen Counter Certified Nurse-Midwife 08/26/2012 6:46 PM

## 2012-09-04 ENCOUNTER — Ambulatory Visit: Payer: Medicaid Other | Admitting: Obstetrics & Gynecology

## 2012-09-04 DIAGNOSIS — Z3480 Encounter for supervision of other normal pregnancy, unspecified trimester: Secondary | ICD-10-CM

## 2012-09-04 LAB — POCT URINALYSIS DIPSTICK: Spec Grav, UA: <=1.005

## 2012-09-12 ENCOUNTER — Encounter: Payer: Medicaid Other | Admitting: Obstetrics & Gynecology

## 2012-09-26 ENCOUNTER — Encounter: Payer: Medicaid Other | Admitting: Obstetrics & Gynecology

## 2012-09-26 DIAGNOSIS — Z3480 Encounter for supervision of other normal pregnancy, unspecified trimester: Secondary | ICD-10-CM

## 2012-09-26 DIAGNOSIS — Z3490 Encounter for supervision of normal pregnancy, unspecified, unspecified trimester: Secondary | ICD-10-CM | POA: Insufficient documentation

## 2012-09-30 ENCOUNTER — Encounter (HOSPITAL_COMMUNITY): Payer: Self-pay | Admitting: *Deleted

## 2012-09-30 ENCOUNTER — Inpatient Hospital Stay (HOSPITAL_COMMUNITY)
Admission: AD | Admit: 2012-09-30 | Discharge: 2012-10-03 | DRG: 775 | Disposition: A | Discharge status: Home / Self Care | Payer: Medicaid Other | Source: Ambulatory Visit | Attending: Obstetrics | Admitting: Obstetrics

## 2012-09-30 DIAGNOSIS — D509 Iron deficiency anemia, unspecified: Secondary | ICD-10-CM | POA: Diagnosis present

## 2012-09-30 DIAGNOSIS — O9902 Anemia complicating childbirth: Principal | ICD-10-CM | POA: Diagnosis present

## 2012-09-30 LAB — CBC
Platelets: 222 10*3/uL (ref 150–400)
RBC: 3.97 MIL/uL (ref 3.87–5.11)
RDW: 15.3 % (ref 11.5–15.5)
WBC: 7.7 10*3/uL (ref 4.0–10.5)

## 2012-09-30 LAB — POCT FERN TEST: POCT Fern Test: POSITIVE

## 2012-09-30 MED ORDER — LIDOCAINE HCL (PF) 1 % IJ SOLN
30.0000 mL | INTRAMUSCULAR | Status: DC | PRN
  Filled 2012-09-30: qty 30

## 2012-09-30 MED ORDER — EPHEDRINE 5 MG/ML INJ
10.0000 mg | INTRAVENOUS | Status: DC | PRN
  Filled 2012-09-30: qty 2

## 2012-09-30 MED ORDER — LACTATED RINGERS IV SOLN
INTRAVENOUS | Status: DC
  Administered 2012-09-30: 22:00:00 via INTRAVENOUS

## 2012-09-30 MED ORDER — PHENYLEPHRINE 40 MCG/ML (10ML) SYRINGE FOR IV PUSH (FOR BLOOD PRESSURE SUPPORT)
80.0000 ug | PREFILLED_SYRINGE | INTRAVENOUS | Status: DC | PRN
  Filled 2012-09-30: qty 5
  Filled 2012-09-30: qty 2

## 2012-09-30 MED ORDER — ACETAMINOPHEN 325 MG PO TABS: 650.0000 mg | ORAL_TABLET | ORAL | Status: DC | PRN

## 2012-09-30 MED ORDER — PHENYLEPHRINE 40 MCG/ML (10ML) SYRINGE FOR IV PUSH (FOR BLOOD PRESSURE SUPPORT)
80.0000 ug | PREFILLED_SYRINGE | INTRAVENOUS | Status: DC | PRN
  Filled 2012-09-30: qty 2

## 2012-09-30 MED ORDER — ONDANSETRON HCL 4 MG/2ML IJ SOLN: 4.0000 mg | Freq: Four times a day (QID) | INTRAMUSCULAR | Status: DC | PRN

## 2012-09-30 MED ORDER — FENTANYL 2.5 MCG/ML BUPIVACAINE 1/10 % EPIDURAL INFUSION (WH - ANES)
14.0000 mL/h | INTRAMUSCULAR | Status: DC | PRN
  Administered 2012-10-01: 14 mL/h via EPIDURAL
  Filled 2012-09-30: qty 125

## 2012-09-30 MED ORDER — FLEET ENEMA 7-19 GM/118ML RE ENEM: 1.0000 | ENEMA | RECTAL | Status: DC | PRN

## 2012-09-30 MED ORDER — OXYTOCIN 40 UNITS IN LACTATED RINGERS INFUSION - SIMPLE MED
1.0000 m[IU]/min | INTRAVENOUS | Status: DC
  Administered 2012-09-30: 2 m[IU]/min via INTRAVENOUS
  Filled 2012-09-30 (×2): qty 1000

## 2012-09-30 MED ORDER — TERBUTALINE SULFATE 1 MG/ML IJ SOLN: 0.2500 mg | Freq: Once | INTRAMUSCULAR | Status: AC | PRN

## 2012-09-30 MED ORDER — LACTATED RINGERS IV SOLN: 500.0000 mL | INTRAVENOUS | Status: DC | PRN

## 2012-09-30 MED ORDER — IBUPROFEN 600 MG PO TABS
600.0000 mg | ORAL_TABLET | Freq: Four times a day (QID) | ORAL | Status: DC | PRN
  Administered 2012-10-01: 600 mg via ORAL
  Filled 2012-09-30: qty 1

## 2012-09-30 MED ORDER — CITRIC ACID-SODIUM CITRATE 334-500 MG/5ML PO SOLN: 30.0000 mL | ORAL | Status: DC | PRN

## 2012-09-30 MED ORDER — OXYCODONE-ACETAMINOPHEN 5-325 MG PO TABS
1.0000 | ORAL_TABLET | ORAL | Status: DC | PRN
  Administered 2012-10-01: 1 via ORAL
  Filled 2012-09-30: qty 1

## 2012-09-30 MED ORDER — DIPHENHYDRAMINE HCL 50 MG/ML IJ SOLN: 12.5000 mg | INTRAMUSCULAR | Status: DC | PRN

## 2012-09-30 MED ORDER — EPHEDRINE 5 MG/ML INJ
10.0000 mg | INTRAVENOUS | Status: DC | PRN
  Filled 2012-09-30: qty 2
  Filled 2012-09-30: qty 4

## 2012-09-30 MED ORDER — OXYTOCIN BOLUS FROM INFUSION
500.0000 mL | INTRAVENOUS | Status: DC
  Administered 2012-10-01: 500 mL via INTRAVENOUS

## 2012-09-30 MED ORDER — DEXTROSE 5 % IV SOLN
2.5000 10*6.[IU] | INTRAVENOUS | Status: DC
  Administered 2012-10-01: 2.5 10*6.[IU] via INTRAVENOUS
  Filled 2012-09-30 (×4): qty 2.5

## 2012-09-30 MED ORDER — PENICILLIN G POTASSIUM 5000000 UNITS IJ SOLR
5.0000 10*6.[IU] | Freq: Once | INTRAVENOUS | Status: AC
  Administered 2012-09-30: 5 10*6.[IU] via INTRAVENOUS
  Filled 2012-09-30: qty 5

## 2012-09-30 MED ORDER — BUTORPHANOL TARTRATE 1 MG/ML IJ SOLN: 1.0000 mg | INTRAMUSCULAR | Status: DC | PRN

## 2012-09-30 MED ORDER — OXYTOCIN 40 UNITS IN LACTATED RINGERS INFUSION - SIMPLE MED: 62.5000 mL/h | INTRAVENOUS | Status: DC

## 2012-09-30 MED ORDER — LACTATED RINGERS IV SOLN
500.0000 mL | Freq: Once | INTRAVENOUS | Status: AC
  Administered 2012-10-01: 500 mL via INTRAVENOUS

## 2012-09-30 NOTE — MAU Note (Signed)
Pt in c/o occasional contraction and vaginal itching.  Reports having a watery, thin discharge since yesterday,  States multiple times has had wet underwear.  Went to doctor yesterday, but did not have cervix checked.  States leaking started after appt.  Reports vaginal itching since yesterday.  Denies any bleeding.  Decreased fetal movement today, has felt movement since at MAU.

## 2012-09-30 NOTE — MAU Provider Note (Signed)
History     CSN: [id number]  Arrival date and time: 09/30/12 2033   First Provider Initiated Contact with Patient 09/30/12 2109      Chief Complaint  Patient presents with  . Contractions  . Vaginal Itching   HPI  Victoria Schneider is a 32 y.o. G3P2002 at [redacted]w[redacted]d who presents today with vaginal itching and clear watery discharge since yesterday. She is also having some irregular UCs and some pain in the right side. She thinks the pain on the right is the baby moving.   Past Medical History  Diagnosis Date  . Medical history non-contributory     Past Surgical History  Procedure Laterality Date  . No past surgeries      History reviewed. No pertinent family history.  History  Substance Use Topics  . Smoking status: Never Smoker   . Smokeless tobacco: Not on file  . Alcohol Use: No     Comment: rarely    Allergies: No Known Allergies  Prescriptions prior to admission  Medication Sig Dispense Refill  . acetaminophen (TYLENOL) 500 MG tablet Take 1,000 mg by mouth every 6 (six) hours as needed for pain (For headache.).      Marland Kitchen calcium carbonate (TUMS - DOSED IN MG ELEMENTAL CALCIUM) 500 MG chewable tablet Chew 1 tablet by mouth daily. For heartburn      . diphenhydrAMINE (SOMINEX) 25 MG tablet Take 25 mg by mouth at bedtime as needed for sleep.      . pantoprazole (PROTONIX) 40 MG tablet Take 40 mg by mouth daily.      . Prenatal Vit-Fe Fumarate-FA (PRENATAL MULTIVITAMIN) TABS Take 1 tablet by mouth daily.      . [DISCONTINUED] loperamide (IMODIUM A-D) 2 MG tablet Take 1 tablet (2 mg total) by mouth 4 (four) times daily as needed for diarrhea or loose stools.  15 tablet  0  . [DISCONTINUED] ondansetron (ZOFRAN) 4 MG tablet Take 1 tablet (4 mg total) by mouth daily as needed for nausea.  30 tablet  1    Review of Systems  Constitutional: Negative for fever and chills.  Eyes: Negative for blurred vision.  Respiratory: Negative for shortness of breath.   Cardiovascular:  Negative for chest pain.  Gastrointestinal: Negative for nausea, vomiting, diarrhea and constipation.  Genitourinary: Positive for frequency. Negative for dysuria and urgency.  Musculoskeletal: Negative for myalgias.  Neurological: Negative for dizziness and headaches.   Physical Exam   Blood pressure 133/75, pulse 88, temperature 98.1 F (36.7 C), temperature source Oral, resp. rate 18, height 5\' 5"  (1.651 m), weight 205 lb (92.987 kg), last menstrual period 12/19/2011.  Physical Exam  Nursing note and vitals reviewed. Constitutional: She is oriented to person, place, and time. She appears well-developed and well-nourished. No distress.  Cardiovascular: Normal rate.   Respiratory: Effort normal.  GI: Soft.  Neurological: She is alert and oriented to person, place, and time.  Skin: Skin is warm and dry.  Psychiatric: She has a normal mood and affect.    MAU Course  Procedures  Results for orders placed during the hospital encounter of 09/30/12 (from the past 24 hour(s))  POCT FERN TEST     Status: None   Collection Time    09/30/12  9:30 PM      Result Value Range   POCT Fern Test Positive = ruptured amniotic membanes    CBC     Status: Abnormal   Collection Time    09/30/12 10:25 PM  Result Value Range   WBC 7.7  4.0 - 10.5 K/uL   RBC 3.97  3.87 - 5.11 MIL/uL   Hemoglobin 9.5 (*) 12.0 - 15.0 g/dL   HCT 16.1 (*) 09.6 - 04.5 %   MCV 76.1 (*) 78.0 - 100.0 fL   MCH 23.9 (*) 26.0 - 34.0 pg   MCHC 31.5  30.0 - 36.0 g/dL   RDW 40.9  81.1 - 91.4 %   Platelets 222  150 - 400 K/uL     Assessment and Plan  Admit to labor and delivery  Tawnya Crook 09/30/2012, 9:09 PM

## 2012-10-01 ENCOUNTER — Encounter: Payer: Medicaid Other | Admitting: Obstetrics

## 2012-10-01 ENCOUNTER — Encounter (HOSPITAL_COMMUNITY): Payer: Self-pay | Admitting: Obstetrics

## 2012-10-01 ENCOUNTER — Inpatient Hospital Stay (HOSPITAL_COMMUNITY): Payer: Medicaid Other | Admitting: Anesthesiology

## 2012-10-01 ENCOUNTER — Other Ambulatory Visit: Payer: Medicaid Other

## 2012-10-01 ENCOUNTER — Encounter (HOSPITAL_COMMUNITY): Payer: Self-pay | Admitting: Anesthesiology

## 2012-10-01 LAB — RAPID HIV SCREEN (WH-MAU): Rapid HIV Screen: NONREACTIVE

## 2012-10-01 LAB — TYPE AND SCREEN: Antibody Screen: NEGATIVE

## 2012-10-01 LAB — RPR: RPR Ser Ql: NONREACTIVE

## 2012-10-01 LAB — HEPATITIS B SURFACE ANTIGEN: Hepatitis B Surface Ag: NEGATIVE

## 2012-10-01 LAB — RUBELLA SCREEN: Rubella: 3.87 Index — ABNORMAL HIGH (ref <0.90)

## 2012-10-01 MED ORDER — LANOLIN HYDROUS EX OINT: TOPICAL_OINTMENT | CUTANEOUS | Status: DC | PRN

## 2012-10-01 MED ORDER — LIDOCAINE HCL (PF) 1 % IJ SOLN
INTRAMUSCULAR | Status: DC | PRN
  Administered 2012-10-01 (×2): 5 mL

## 2012-10-01 MED ORDER — DIPHENHYDRAMINE HCL 25 MG PO CAPS: 25.0000 mg | ORAL_CAPSULE | Freq: Four times a day (QID) | ORAL | Status: DC | PRN

## 2012-10-01 MED ORDER — FERROUS SULFATE 325 (65 FE) MG PO TABS
325.0000 mg | ORAL_TABLET | Freq: Two times a day (BID) | ORAL | Status: DC
  Administered 2012-10-01 – 2012-10-03 (×4): 325 mg via ORAL
  Filled 2012-10-01 (×4): qty 1

## 2012-10-01 MED ORDER — PRENATAL MULTIVITAMIN CH
1.0000 | ORAL_TABLET | Freq: Every day | ORAL | Status: DC
  Administered 2012-10-01 – 2012-10-02 (×2): 1 via ORAL
  Filled 2012-10-01 (×2): qty 1

## 2012-10-01 MED ORDER — ONDANSETRON HCL 4 MG/2ML IJ SOLN: 4.0000 mg | INTRAMUSCULAR | Status: DC | PRN

## 2012-10-01 MED ORDER — IBUPROFEN 600 MG PO TABS
600.0000 mg | ORAL_TABLET | Freq: Four times a day (QID) | ORAL | Status: DC
  Administered 2012-10-01 – 2012-10-03 (×8): 600 mg via ORAL
  Filled 2012-10-01 (×8): qty 1

## 2012-10-01 MED ORDER — SIMETHICONE 80 MG PO CHEW: 80.0000 mg | CHEWABLE_TABLET | ORAL | Status: DC | PRN

## 2012-10-01 MED ORDER — TETANUS-DIPHTH-ACELL PERTUSSIS 5-2.5-18.5 LF-MCG/0.5 IM SUSP
0.5000 mL | Freq: Once | INTRAMUSCULAR | Status: AC
  Administered 2012-10-02: 0.5 mL via INTRAMUSCULAR
  Filled 2012-10-01: qty 0.5

## 2012-10-01 MED ORDER — OXYCODONE-ACETAMINOPHEN 5-325 MG PO TABS
1.0000 | ORAL_TABLET | ORAL | Status: DC | PRN
  Administered 2012-10-01 – 2012-10-02 (×5): 1 via ORAL
  Administered 2012-10-02: 2 via ORAL
  Administered 2012-10-02 – 2012-10-03 (×3): 1 via ORAL
  Filled 2012-10-01 (×2): qty 1
  Filled 2012-10-01: qty 2
  Filled 2012-10-01 (×6): qty 1

## 2012-10-01 MED ORDER — ZOLPIDEM TARTRATE 5 MG PO TABS: 5.0000 mg | ORAL_TABLET | Freq: Every evening | ORAL | Status: DC | PRN

## 2012-10-01 MED ORDER — DIBUCAINE 1 % RE OINT
1.0000 "application " | TOPICAL_OINTMENT | RECTAL | Status: DC | PRN
  Administered 2012-10-02: 1 via RECTAL
  Filled 2012-10-01: qty 28

## 2012-10-01 MED ORDER — SENNOSIDES-DOCUSATE SODIUM 8.6-50 MG PO TABS
2.0000 | ORAL_TABLET | Freq: Every day | ORAL | Status: DC
  Administered 2012-10-01 – 2012-10-02 (×2): 2 via ORAL

## 2012-10-01 MED ORDER — BENZOCAINE-MENTHOL 20-0.5 % EX AERO
1.0000 "application " | INHALATION_SPRAY | CUTANEOUS | Status: DC | PRN
  Administered 2012-10-01: 1 via TOPICAL
  Filled 2012-10-01: qty 56

## 2012-10-01 MED ORDER — ONDANSETRON HCL 4 MG PO TABS: 4.0000 mg | ORAL_TABLET | ORAL | Status: DC | PRN

## 2012-10-01 MED ORDER — WITCH HAZEL-GLYCERIN EX PADS: 1.0000 "application " | MEDICATED_PAD | CUTANEOUS | Status: DC | PRN

## 2012-10-01 NOTE — Progress Notes (Signed)
UR completed 

## 2012-10-01 NOTE — Anesthesia Postprocedure Evaluation (Signed)
Anesthesia Post Note  Patient: Victoria Schneider  Procedure(s) Performed: * No procedures listed *  Anesthesia type: Epidural  Patient location: Mother/Baby  Post pain: Pain level controlled  Post assessment: Post-op Vital signs reviewed  Last Vitals: BP 125/79  Pulse 72  Temp(Src) 36.6 C (Axillary)  Resp 18  Ht 5\' 5"  (1.651 m)  Wt 205 lb (92.987 kg)  BMI 34.11 kg/m2  SpO2 98%  LMP 12/19/2011  Post vital signs: Reviewed  Level of consciousness: awake  Complications: No apparent anesthesia complications

## 2012-10-01 NOTE — Anesthesia Procedure Notes (Signed)
Epidural Patient location during procedure: OB Start time: 10/01/2012 1:16 AM  Staffing Anesthesiologist: Angus Seller., Harrell Gave. Performed by: anesthesiologist   Preanesthetic Checklist Completed: patient identified, site marked, surgical consent, pre-op evaluation, timeout performed, IV checked, risks and benefits discussed and monitors and equipment checked  Epidural Patient position: sitting Prep: site prepped and draped and DuraPrep Patient monitoring: continuous pulse ox and blood pressure Approach: midline Injection technique: LOR air and LOR saline  Needle:  Needle type: Tuohy  Needle gauge: 17 G Needle length: 9 cm and 9 Needle insertion depth: 5 cm cm Catheter type: closed end flexible Catheter size: 19 Gauge Catheter at skin depth: 10 cm Test dose: negative  Assessment Events: blood not aspirated, injection not painful, no injection resistance, negative IV test and no paresthesia  Additional Notes Patient identified.  Risk benefits discussed including failed block, incomplete pain control, headache, nerve damage, paralysis, blood pressure changes, nausea, vomiting, reactions to medication both toxic or allergic, and postpartum back pain.  Patient expressed understanding and wished to proceed.  All questions were answered.  Sterile technique used throughout procedure and epidural site dressed with sterile barrier dressing. No paresthesia or other complications noted.The patient did not experience any signs of intravascular injection such as tinnitus or metallic taste in mouth nor signs of intrathecal spread such as rapid motor block. Please see nursing notes for vital signs.

## 2012-10-01 NOTE — H&P (Signed)
This is Dr. Francoise Ceo dictating the history and physical on  Victoria Schneider she's a 32 year old gravida 3 para 2002 at 40 weeks and 2 days who was admitted the night 09/30/2012 with leaking fluid since 09/29/2012 and contracting she states the leakage started at 7:30 PM on Sunday but she did not think it was of significance so she came in on the 14th because leakage would not stop her GBS is unknown prenatal labs unavailable and she was treated with penicillin cervix 3 cm 80% vertex -1 and she was started on Pitocin Past medical history negative Past surgical history negative Social history negative System review noncontributory Physical exam revealed a well-developed female not in labor HEENT negative Breasts negative Lungs clear to P&A Abdomen term Pelvic as described above Extremities negative and

## 2012-10-01 NOTE — Anesthesia Preprocedure Evaluation (Signed)

## 2012-10-02 LAB — CBC
HCT: 26.7 % — ABNORMAL LOW (ref 36.0–46.0)
Hemoglobin: 8.3 g/dL — ABNORMAL LOW (ref 12.0–15.0)
MCV: 76.7 fL — ABNORMAL LOW (ref 78.0–100.0)
WBC: 9.4 10*3/uL (ref 4.0–10.5)

## 2012-10-02 NOTE — Progress Notes (Signed)
Post Partum Day 1 Subjective: no complaints  Objective: Blood pressure 104/66, pulse 83, temperature 97.9 F (36.6 C), temperature source Oral, resp. rate 18, height 5\' 5"  (1.651 m), weight 205 lb (92.987 kg), last menstrual period 12/19/2011, SpO2 98.00%, unknown if currently breastfeeding.  Physical Exam:  General: alert and no distress Lochia: appropriate Uterine Fundus: firm Incision: none DVT Evaluation: No evidence of DVT seen on physical exam.   Recent Labs  09/30/12 2225 10/02/12 0625  HGB 9.5* 8.3*  HCT 30.2* 26.7*    Assessment/Plan: Plan for discharge tomorrow.  Anemia.  Chronic iron deficiency.  Clinically stable.  Iron Rx.   LOS: 2 days   HARPER,CHARLES A 10/02/2012, 7:53 AM

## 2012-10-03 MED ORDER — FUSION PLUS PO CAPS: 1.0000 | ORAL_CAPSULE | Freq: Every day | ORAL | Status: DC

## 2012-10-03 MED ORDER — OXYCODONE-ACETAMINOPHEN 5-325 MG PO TABS: 1.0000 | ORAL_TABLET | ORAL | Status: DC | PRN

## 2012-10-03 MED ORDER — IBUPROFEN 600 MG PO TABS: 600.0000 mg | ORAL_TABLET | Freq: Four times a day (QID) | ORAL | Status: DC | PRN

## 2012-10-03 NOTE — Discharge Summary (Signed)
Obstetric Discharge Summary Reason for Admission: onset of labor Prenatal Procedures: ultrasound Intrapartum Procedures: spontaneous vaginal delivery Postpartum Procedures: none Complications-Operative and Postpartum: none Hemoglobin  Date Value Range Status  10/02/2012 8.3* 12.0 - 15.0 g/dL Final     HCT  Date Value Range Status  10/02/2012 26.7* 36.0 - 46.0 % Final    Physical Exam:  General: alert and no distress Lochia: appropriate Uterine Fundus: firm Incision: healing well DVT Evaluation: No evidence of DVT seen on physical exam.  Discharge Diagnoses: Term Pregnancy-delivered  Discharge Information: Date: 10/03/2012 Activity: pelvic rest Diet: routine Medications: None, Ibuprofen, Colace, Iron and Percocet Condition: stable Instructions: refer to practice specific booklet Discharge to: home Follow-up Information   Follow up with Antionette Char A, MD. Schedule an appointment as soon as possible for a visit in 6 weeks.   Contact information:   [address] [place] [address] [phone]       Newborn Data: Live born female  Birth Weight: 7 lb 12.9 oz (3540 g) APGAR: 8, 9  Home with mother.  HARPER,CHARLES A 10/03/2012, 6:35 AM

## 2012-10-03 NOTE — Progress Notes (Signed)
Post Partum Day 2 Subjective: no complaints  Objective: Blood pressure 103/67, pulse 90, temperature 97.5 F (36.4 C), temperature source Oral, resp. rate 18, height 5\' 5"  (1.651 m), weight 205 lb (92.987 kg), last menstrual period 12/19/2011, SpO2 98.00%, unknown if currently breastfeeding.  Physical Exam:  General: alert and no distress Lochia: appropriate Uterine Fundus: firm Incision: healing well DVT Evaluation: No evidence of DVT seen on physical exam.   Recent Labs  09/30/12 2225 10/02/12 0625  HGB 9.5* 8.3*  HCT 30.2* 26.7*    Assessment/Plan: Discharge home   LOS: 3 days   HARPER,CHARLES A 10/03/2012, 6:28 AM

## 2012-10-03 NOTE — Progress Notes (Signed)
Pt discharged before CSW could assess reason for "insufficient PNC." CSW will monitor drug screen results & make a referral if necessary.      

## 2012-11-13 ENCOUNTER — Ambulatory Visit: Payer: Medicaid Other | Admitting: Obstetrics & Gynecology

## 2014-01-07 ENCOUNTER — Ambulatory Visit: Payer: Medicaid Other

## 2014-04-20 ENCOUNTER — Encounter (HOSPITAL_COMMUNITY): Payer: Self-pay | Admitting: Obstetrics

## 2014-10-11 ENCOUNTER — Encounter (HOSPITAL_BASED_OUTPATIENT_CLINIC_OR_DEPARTMENT_OTHER): Payer: Self-pay | Admitting: Emergency Medicine

## 2014-10-11 DIAGNOSIS — Z3202 Encounter for pregnancy test, result negative: Secondary | ICD-10-CM | POA: Insufficient documentation

## 2014-10-11 DIAGNOSIS — B349 Viral infection, unspecified: Secondary | ICD-10-CM | POA: Insufficient documentation

## 2014-10-11 DIAGNOSIS — Z7951 Long term (current) use of inhaled steroids: Secondary | ICD-10-CM | POA: Insufficient documentation

## 2014-10-11 DIAGNOSIS — Z79899 Other long term (current) drug therapy: Secondary | ICD-10-CM | POA: Insufficient documentation

## 2014-10-11 DIAGNOSIS — H9209 Otalgia, unspecified ear: Secondary | ICD-10-CM | POA: Insufficient documentation

## 2014-10-11 DIAGNOSIS — Z791 Long term (current) use of non-steroidal anti-inflammatories (NSAID): Secondary | ICD-10-CM | POA: Insufficient documentation

## 2014-10-11 NOTE — ED Notes (Signed)
Pt reports headache since last Wednesday nasal congestion with ear pain , fever, chills, and N/V since friday

## 2014-10-12 ENCOUNTER — Encounter (HOSPITAL_BASED_OUTPATIENT_CLINIC_OR_DEPARTMENT_OTHER): Payer: Self-pay | Admitting: Emergency Medicine

## 2014-10-12 ENCOUNTER — Emergency Department (HOSPITAL_BASED_OUTPATIENT_CLINIC_OR_DEPARTMENT_OTHER)
Admission: EM | Admit: 2014-10-12 | Discharge: 2014-10-12 | Disposition: A | Discharge status: Home / Self Care | Payer: Medicaid Other | Attending: Emergency Medicine | Admitting: Emergency Medicine

## 2014-10-12 DIAGNOSIS — B349 Viral infection, unspecified: Secondary | ICD-10-CM

## 2014-10-12 LAB — PREGNANCY, URINE: PREG TEST UR: NEGATIVE

## 2014-10-12 LAB — RAPID STREP SCREEN (MED CTR MEBANE ONLY): STREPTOCOCCUS, GROUP A SCREEN (DIRECT): NEGATIVE

## 2014-10-12 MED ORDER — CETIRIZINE-PSEUDOEPHEDRINE ER 5-120 MG PO TB12: 1.0000 | ORAL_TABLET | Freq: Two times a day (BID) | ORAL | Status: DC

## 2014-10-12 MED ORDER — SODIUM CHLORIDE 0.9 % IV BOLUS (SEPSIS)
1000.0000 mL | Freq: Once | INTRAVENOUS | Status: AC
  Administered 2014-10-12: 1000 mL via INTRAVENOUS

## 2014-10-12 MED ORDER — BENZONATATE 100 MG PO CAPS
200.0000 mg | ORAL_CAPSULE | Freq: Once | ORAL | Status: AC
  Administered 2014-10-12: 200 mg via ORAL

## 2014-10-12 MED ORDER — LORATADINE 10 MG PO TABS
10.0000 mg | ORAL_TABLET | Freq: Once | ORAL | Status: AC
  Administered 2014-10-12: 10 mg via ORAL

## 2014-10-12 MED ORDER — NAPROXEN 250 MG PO TABS
ORAL_TABLET | ORAL | Status: AC
  Filled 2014-10-12: qty 2

## 2014-10-12 MED ORDER — ONDANSETRON 8 MG PO TBDP
ORAL_TABLET | ORAL | Status: AC
  Filled 2014-10-12: qty 1

## 2014-10-12 MED ORDER — ACETAMINOPHEN 500 MG PO TABS
1000.0000 mg | ORAL_TABLET | Freq: Once | ORAL | Status: AC
  Administered 2014-10-12: 1000 mg via ORAL
  Filled 2014-10-12: qty 2

## 2014-10-12 MED ORDER — NAPROXEN 250 MG PO TABS
500.0000 mg | ORAL_TABLET | Freq: Once | ORAL | Status: AC
  Administered 2014-10-12: 500 mg via ORAL

## 2014-10-12 MED ORDER — BENZONATATE 100 MG PO CAPS
ORAL_CAPSULE | ORAL | Status: AC
  Filled 2014-10-12: qty 2

## 2014-10-12 MED ORDER — FLUTICASONE PROPIONATE 50 MCG/ACT NA SUSP: 2.0000 | Freq: Every day | NASAL | Status: DC

## 2014-10-12 MED ORDER — NAPROXEN 375 MG PO TABS: 375.0000 mg | ORAL_TABLET | Freq: Two times a day (BID) | ORAL | Status: DC

## 2014-10-12 MED ORDER — LORATADINE 10 MG PO TABS
ORAL_TABLET | ORAL | Status: AC
  Filled 2014-10-12: qty 1

## 2014-10-12 MED ORDER — ONDANSETRON 8 MG PO TBDP
8.0000 mg | ORAL_TABLET | Freq: Once | ORAL | Status: AC
  Administered 2014-10-12: 8 mg via ORAL

## 2014-10-12 MED ORDER — KETOROLAC TROMETHAMINE 30 MG/ML IJ SOLN
30.0000 mg | Freq: Once | INTRAMUSCULAR | Status: AC
Start: 2014-10-12 — End: 2014-10-12
  Administered 2014-10-12: 30 mg via INTRAVENOUS
  Filled 2014-10-12: qty 1

## 2014-10-12 MED ORDER — ONDANSETRON HCL 8 MG PO TABS: 8.0000 mg | ORAL_TABLET | Freq: Three times a day (TID) | ORAL | Status: DC | PRN

## 2014-10-12 NOTE — ED Notes (Signed)
MD aware patient's HR is 114 and increases to 125 with movements.

## 2014-10-12 NOTE — ED Provider Notes (Signed)
CSN: [id number]     Arrival date & time 10/11/14  2331 History   First MD Initiated Contact with Patient 10/12/14 0241     Chief Complaint  Patient presents with  . URI     (Consider location/radiation/quality/duration/timing/severity/associated sxs/prior Treatment) Patient is a 34 y.o. female presenting with URI. The history is provided by the patient.  URI Presenting symptoms: congestion, cough, ear pain, fever, rhinorrhea and sore throat   Severity:  Moderate Onset quality:  Gradual Duration:  6 days Timing:  Constant Progression:  Unchanged Chronicity:  New Relieved by:  Nothing Worsened by:  Nothing tried Ineffective treatments:  None tried Associated symptoms: myalgias and sneezing   Associated symptoms: no arthralgias   Risk factors: no recent travel     Past Medical History  Diagnosis Date  . Medical history non-contributory    Past Surgical History  Procedure Laterality Date  . No past surgeries     History reviewed. No pertinent family history. History  Substance Use Topics  . Smoking status: Never Smoker   . Smokeless tobacco: Never Used  . Alcohol Use: No     Comment: rarely   OB History    Gravida Para Term Preterm AB TAB SAB Ectopic Multiple Living   3 3 3       3      Review of Systems  Constitutional: Positive for fever.  HENT: Positive for congestion, ear pain, rhinorrhea, sneezing and sore throat.   Respiratory: Positive for cough.   Musculoskeletal: Positive for myalgias. Negative for arthralgias.  All other systems reviewed and are negative.     Allergies  Review of patient's allergies indicates no known allergies.  Home Medications   Prior to Admission medications   Medication Sig Start Date End Date Taking? Authorizing Provider  ibuprofen (ADVIL,MOTRIN) 600 MG tablet Take 1 tablet (600 mg total) by mouth every 6 (six) hours as needed for pain. 10/03/12  Yes Brock Badharles A Harper, MD  Iron-FA-B Cmp-C-Biot-Probiotic (FUSION PLUS) CAPS  Take 1 capsule by mouth daily before breakfast. 10/03/12  Yes Brock Badharles A Harper, MD  cetirizine-pseudoephedrine (ZYRTEC-D) 5-120 MG per tablet Take 1 tablet by mouth 2 (two) times daily. 10/12/14   Novis League, MD  fluticasone Universal(FLONASE) 50 MCG/ACT nasal spray Place 2 sprays into both nostrils daily. 10/12/14   Krishna Heuer, MD  naproxen (NAPROSYN) 375 MG tablet Take 1 tablet (375 mg total) by mouth 2 (two) times daily. 10/12/14   Kash Mothershead, MD  ondansetron (ZOFRAN) 8 MG tablet Take 1 tablet (8 mg total) by mouth every 8 (eight) hours as needed for nausea. 10/12/14   Kaisyn Reinhold, MD  oxyCODONE-acetaminophen (PERCOCET/ROXICET) 5-325 MG per tablet Take 1-2 tablets by mouth every 4 (four) hours as needed. 10/03/12   Brock Badharles A Harper, MD   BP 98/49 mmHg  Pulse 99  Temp(Src) 100 F (37.8 C) (Oral)  Resp 18  Ht 5\' 6"  (1.676 m)  Wt 185 lb (83.915 kg)  BMI 29.87 kg/m2  SpO2 98%  LMP 09/16/2014 Physical Exam  Constitutional: She is oriented to person, place, and time. She appears well-developed and well-nourished. No distress.  HENT:  Head: Normocephalic and atraumatic.  Mouth/Throat: Oropharynx is clear and moist.  Eyes: Conjunctivae are normal. Pupils are equal, round, and reactive to light.  Neck: Normal range of motion. Neck supple.  Cardiovascular: Normal rate, regular rhythm and intact distal pulses.   Pulmonary/Chest: Effort normal and breath sounds normal. No respiratory distress. She has no wheezes. She has no  rales. She exhibits no tenderness.  Abdominal: Soft. Bowel sounds are normal. There is no tenderness. There is no rebound and no guarding.  Lymphadenopathy:    She has no cervical adenopathy.  Neurological: She is alert and oriented to person, place, and time.  Skin: Skin is warm and dry.  Psychiatric: She has a normal mood and affect.    ED Course  Procedures (including critical care time) Labs Review Labs Reviewed  RAPID STREP SCREEN  CULTURE, GROUP A STREP   PREGNANCY, URINE    Imaging Review No results found.   EKG Interpretation None      MDM   Final diagnoses:  Viral illness    Medications  loratadine (CLARITIN) tablet 10 mg (10 mg Oral Given 10/12/14 0435)  benzonatate (TESSALON) capsule 200 mg (200 mg Oral Given 10/12/14 0434)  naproxen (NAPROSYN) tablet 500 mg (500 mg Oral Given 10/12/14 0435)  ondansetron (ZOFRAN-ODT) disintegrating tablet 8 mg (8 mg Oral Given 10/12/14 0436)  ondansetron (ZOFRAN-ODT) 8 MG disintegrating tablet (0 mg  Duplicate 10/12/14 0430)  acetaminophen (TYLENOL) tablet 1,000 mg (1,000 mg Oral Given 10/12/14 0457)  ketorolac (TORADOL) 30 MG/ML injection 30 mg (30 mg Intravenous Given 10/12/14 0522)  sodium chloride 0.9 % bolus 1,000 mL (0 mLs Intravenous Stopped 10/12/14 1610)    Results for orders placed or performed during the hospital encounter of 10/12/14  Rapid strep screen  Result Value Ref Range   Streptococcus, Group A Screen (Direct) NEGATIVE NEGATIVE  Pregnancy, urine  Result Value Ref Range   Preg Test, Ur NEGATIVE NEGATIVE   No results found. Feeling better post medication  Symptomatic relief for home and a note for work.  Lungs are clear B.      Cy Blamer, MD 10/12/14 [phone]

## 2014-10-12 NOTE — ED Notes (Signed)
MD aware of improvement in HR and VSS at d/c. Orders received to d/c pt home. Pt states she feels much better.

## 2014-10-14 LAB — CULTURE, GROUP A STREP

## 2015-06-20 NOTE — L&D Delivery Note (Signed)
35 y.o. is a now [id number], who was admitted for IOL for postdates, now s/p NSVD at 6836w1d  Delivery Note At 9:12 PM a viable female was delivered via Vaginal, Spontaneous Delivery. Head delivered LOA, and shoulders and body delivered in the usual fashion. Cord clamped after 60 second delay, and cut by FOB. Placenta delivered spontaneously and intact. Fundus firm with Pitocin and massage.   APGAR: 9, 9; weight pending. Placenta status: intact.  Cord: 3-vessel  Anesthesia:  Epidural Episiotomy: None Lacerations: 1st degree Suture Repair: 3.0 vicryl Est. Blood Loss (mL): 50  Mom to postpartum.  Baby to Couplet care / Skin to Skin.  Kandra NicolasJulie P Degele 02/27/2016, 9:38 PM

## 2015-08-11 ENCOUNTER — Encounter (HOSPITAL_COMMUNITY): Payer: Self-pay | Admitting: *Deleted

## 2015-08-11 ENCOUNTER — Inpatient Hospital Stay (HOSPITAL_COMMUNITY)
Admission: AD | Admit: 2015-08-11 | Discharge: 2015-08-11 | Disposition: A | Discharge status: Home / Self Care | Payer: Medicaid Other | Source: Ambulatory Visit | Attending: Obstetrics and Gynecology | Admitting: Obstetrics and Gynecology

## 2015-08-11 DIAGNOSIS — O26891 Other specified pregnancy related conditions, first trimester: Secondary | ICD-10-CM | POA: Diagnosis not present

## 2015-08-11 DIAGNOSIS — J029 Acute pharyngitis, unspecified: Secondary | ICD-10-CM | POA: Insufficient documentation

## 2015-08-11 DIAGNOSIS — Z3A12 12 weeks gestation of pregnancy: Secondary | ICD-10-CM | POA: Diagnosis not present

## 2015-08-11 DIAGNOSIS — R05 Cough: Secondary | ICD-10-CM | POA: Diagnosis not present

## 2015-08-11 DIAGNOSIS — O9989 Other specified diseases and conditions complicating pregnancy, childbirth and the puerperium: Secondary | ICD-10-CM

## 2015-08-11 DIAGNOSIS — J111 Influenza due to unidentified influenza virus with other respiratory manifestations: Secondary | ICD-10-CM

## 2015-08-11 DIAGNOSIS — J09X2 Influenza due to identified novel influenza A virus with other respiratory manifestations: Secondary | ICD-10-CM

## 2015-08-11 LAB — URINALYSIS, ROUTINE W REFLEX MICROSCOPIC
BILIRUBIN URINE: NEGATIVE
Glucose, UA: NEGATIVE mg/dL
KETONES UR: 40 mg/dL — AB
Leukocytes, UA: NEGATIVE
NITRITE: NEGATIVE
PH: 6 (ref 5.0–8.0)
PROTEIN: NEGATIVE mg/dL
Specific Gravity, Urine: 1.025 (ref 1.005–1.030)

## 2015-08-11 LAB — URINE MICROSCOPIC-ADD ON

## 2015-08-11 LAB — RAPID STREP SCREEN (MED CTR MEBANE ONLY): STREPTOCOCCUS, GROUP A SCREEN (DIRECT): NEGATIVE

## 2015-08-11 LAB — POCT PREGNANCY, URINE: PREG TEST UR: POSITIVE — AB

## 2015-08-11 LAB — INFLUENZA PANEL BY PCR (TYPE A & B)
H1N1FLUPCR: NOT DETECTED
Influenza A By PCR: POSITIVE — AB
Influenza B By PCR: NEGATIVE

## 2015-08-11 MED ORDER — OSELTAMIVIR PHOSPHATE 75 MG PO CAPS: 75.0000 mg | ORAL_CAPSULE | Freq: Two times a day (BID) | ORAL | Status: DC

## 2015-08-11 MED ORDER — ACETAMINOPHEN 325 MG PO TABS
650.0000 mg | ORAL_TABLET | Freq: Once | ORAL | Status: AC
  Administered 2015-08-11: 650 mg via ORAL
  Filled 2015-08-11: qty 2

## 2015-08-11 MED ORDER — PROMETHAZINE-CODEINE 6.25-10 MG/5ML PO SYRP: 5.0000 mL | ORAL_SOLUTION | Freq: Four times a day (QID) | ORAL | Status: DC | PRN

## 2015-08-11 MED ORDER — METOCLOPRAMIDE HCL 5 MG/ML IJ SOLN
10.0000 mg | Freq: Once | INTRAMUSCULAR | Status: AC
  Administered 2015-08-11: 10 mg via INTRAVENOUS
  Filled 2015-08-11: qty 2

## 2015-08-11 MED ORDER — DEXTROSE IN LACTATED RINGERS 5 % IV SOLN
INTRAVENOUS | Status: DC
Start: 2015-08-11 — End: 2015-08-11
  Administered 2015-08-11: 14:00:00 via INTRAVENOUS

## 2015-08-11 MED ORDER — HYDROCOD POLST-CPM POLST ER 10-8 MG/5ML PO SUER
5.0000 mL | Freq: Once | ORAL | Status: AC
  Administered 2015-08-11: 5 mL via ORAL
  Filled 2015-08-11: qty 5

## 2015-08-11 MED ORDER — LACTATED RINGERS IV SOLN
INTRAVENOUS | Status: DC
  Administered 2015-08-11: 16:00:00 via INTRAVENOUS

## 2015-08-11 MED ORDER — ACETAMINOPHEN 325 MG PO TABS
650.0000 mg | ORAL_TABLET | Freq: Once | ORAL | Status: DC
  Filled 2015-08-11: qty 2

## 2015-08-11 NOTE — MAU Note (Signed)
Pt presents to MAU with complaints of body aches, fever, cough for a couple of days.

## 2015-08-11 NOTE — Discharge Instructions (Signed)

## 2015-08-11 NOTE — MAU Provider Note (Signed)
Chief Complaint: No chief complaint on file.   Provider initiated care at 1345 on 08/11/15    SUBJECTIVE  HPI  Victoria Schneider is a 35 y.o. [phone] at [redacted]w[redacted]d by LMP who presents to maternity admissions reporting fever, aches, sore throat and cough for 3 days.  Has tried Tylenol without results. Denies sick  Contacts. She denies vaginal bleeding, vaginal itching/burning, urinary symptoms, h/a, dizziness, n/v.    RN Note: Pt presents to MAU with complaints of body aches, fever, cough for a couple of days.          Past Medical History  Diagnosis Date  . Medical history non-contributory    Past Surgical History  Procedure Laterality Date  . No past surgeries     Social History   Social History  . Marital Status: Single    Spouse Name: N/A  . Number of Children: N/A  . Years of Education: N/A   Occupational History  . Not on file.   Social History Main Topics  . Smoking status: Never Smoker   . Smokeless tobacco: Never Used  . Alcohol Use: No     Comment: rarely  . Drug Use: No  . Sexual Activity: Yes    Birth Control/ Protection: None   Other Topics Concern  . Not on file   Social History Narrative   No current facility-administered medications on file prior to encounter.   No current outpatient prescriptions on file prior to encounter.   No Known Allergies  I have reviewed patient's Past Medical Hx, Surgical Hx, Family Hx, Social Hx, medications and allergies.   ROS:  Review of Systems   Constitutional: Positive for fever and chills. +for body aches and myalgias. ENT:  Positive for sore throat, cough and congestion. Gastrointestinal: Negative for nausea, vomiting, abdominal pain, diarrhea and constipation.  Genitourinary: Negative for dysuria.  Musculoskeletal: Negative for back pain.  Neurological: Negative for dizziness and weakness.    Physical Exam  Patient Vitals for the past 24 hrs:  BP Temp Temp src Pulse Resp  08/11/15 1330 - 99.2 F  (37.3 C) Oral - -  08/11/15 1140 120/75 mmHg 99.7 F (37.6 C) - (!) 127 18   Physical Exam  Constitutional: Well-developed female who is ill-appearing.  ENT:  Throat erethema with prominent left tonsil Cardiovascular: tachycardic, regular rhythm Respiratory: normal effort, lungs clear bilaterally, + cough GI: Abd soft, non-tender. Pos BS x 4 MS: Extremities nontender, no edema, normal ROM Neurologic: Alert and oriented x 4.  GU: Neg CVAT.  FHT 190 by doppler   LAB RESULTS Results for orders placed or performed during the hospital encounter of 08/11/15 (from the past 24 hour(s))  Urinalysis, Routine w reflex microscopic (not at [facility])     Status: Abnormal   Collection Time: 08/11/15 11:40 AM  Result Value Ref Range   Color, Urine YELLOW YELLOW   APPearance HAZY (A) CLEAR   Specific Gravity, Urine 1.025 1.005 - 1.030   pH 6.0 5.0 - 8.0   Glucose, UA NEGATIVE NEGATIVE mg/dL   Hgb urine dipstick MODERATE (A) NEGATIVE   Bilirubin Urine NEGATIVE NEGATIVE   Ketones, ur 40 (A) NEGATIVE mg/dL   Protein, ur NEGATIVE NEGATIVE mg/dL   Nitrite NEGATIVE NEGATIVE   Leukocytes, UA NEGATIVE NEGATIVE  Urine microscopic-add on     Status: Abnormal   Collection Time: 08/11/15 11:40 AM  Result Value Ref Range   Squamous Epithelial / LPF 0-5 (A) NONE SEEN   WBC, UA 0-5 0 -  5 WBC/hpf   RBC / HPF 0-5 0 - 5 RBC/hpf   Bacteria, UA FEW (A) NONE SEEN  Pregnancy, urine POC     Status: Abnormal   Collection Time: 08/11/15 11:48 AM  Result Value Ref Range   Preg Test, Ur POSITIVE (A) NEGATIVE       IMAGING No results found.  MAU Management/MDM: Ordered labs and reviewed results.  Strep and flu results pending.   Treatments in MAU included IV hydration. She is very ill-appearing with ketones. Has not been eating/drinking much. Still has headache after first liter of fluids Will give Reglan for headache. Gave Tussionex for cough with relief Headache better after second liter and Reglan WIll  d/c home iwht Tamiflu, though flu test not back yet Fever rose to 101 just prior to discharge >> Tylenol given  ASSESSMENT SIUP at [redacted]w[redacted]d  Probable influenza  PLAN Discharge home Rx Tamiflu for probable flu Rx cough syrup Supportive care    Medication List    ASK your doctor about these medications        acetaminophen 500 MG tablet  Commonly known as:  TYLENOL  Take 1,000 mg by mouth every 6 (six) hours as needed for mild pain or headache.         Encouraged to return here or to other Urgent Care/ED if she develops worsening of symptoms, increase in pain, fever, or other concerning symptoms.    Wynelle Bourgeois CNM, MSN Certified Nurse-Midwife 08/11/2015  1:40 PM

## 2015-08-13 LAB — CULTURE, GROUP A STREP (THRC)

## 2015-08-14 ENCOUNTER — Telehealth: Payer: Self-pay | Admitting: Advanced Practice Midwife

## 2015-08-14 NOTE — Telephone Encounter (Signed)
Flu test positive for influenza A. Continue Tamiflu as prescribed.

## 2015-09-30 LAB — OB RESULTS CONSOLE RPR: RPR: NONREACTIVE

## 2015-09-30 LAB — OB RESULTS CONSOLE HEPATITIS B SURFACE ANTIGEN: Hepatitis B Surface Ag: NEGATIVE

## 2015-09-30 LAB — OB RESULTS CONSOLE RUBELLA ANTIBODY, IGM: RUBELLA: IMMUNE

## 2015-09-30 LAB — OB RESULTS CONSOLE GC/CHLAMYDIA
Chlamydia: NEGATIVE
GC PROBE AMP, GENITAL: NEGATIVE

## 2015-09-30 LAB — OB RESULTS CONSOLE HIV ANTIBODY (ROUTINE TESTING): HIV: NONREACTIVE

## 2016-01-09 ENCOUNTER — Encounter (HOSPITAL_COMMUNITY): Payer: Self-pay | Admitting: *Deleted

## 2016-01-09 ENCOUNTER — Inpatient Hospital Stay (HOSPITAL_COMMUNITY)
Admission: AD | Admit: 2016-01-09 | Discharge: 2016-01-09 | Disposition: A | Discharge status: Home / Self Care | Payer: Medicaid Other | Source: Ambulatory Visit | Attending: Obstetrics and Gynecology | Admitting: Obstetrics and Gynecology

## 2016-01-09 DIAGNOSIS — O26893 Other specified pregnancy related conditions, third trimester: Secondary | ICD-10-CM | POA: Diagnosis not present

## 2016-01-09 DIAGNOSIS — Z880 Allergy status to penicillin: Secondary | ICD-10-CM | POA: Diagnosis not present

## 2016-01-09 DIAGNOSIS — R1030 Lower abdominal pain, unspecified: Secondary | ICD-10-CM | POA: Diagnosis not present

## 2016-01-09 DIAGNOSIS — O4703 False labor before 37 completed weeks of gestation, third trimester: Secondary | ICD-10-CM | POA: Insufficient documentation

## 2016-01-09 DIAGNOSIS — K219 Gastro-esophageal reflux disease without esophagitis: Secondary | ICD-10-CM | POA: Diagnosis not present

## 2016-01-09 DIAGNOSIS — Z3A34 34 weeks gestation of pregnancy: Secondary | ICD-10-CM | POA: Insufficient documentation

## 2016-01-09 DIAGNOSIS — O479 False labor, unspecified: Secondary | ICD-10-CM

## 2016-01-09 DIAGNOSIS — Z79899 Other long term (current) drug therapy: Secondary | ICD-10-CM | POA: Insufficient documentation

## 2016-01-09 DIAGNOSIS — O99613 Diseases of the digestive system complicating pregnancy, third trimester: Secondary | ICD-10-CM | POA: Insufficient documentation

## 2016-01-09 HISTORY — DX: Gastro-esophageal reflux disease without esophagitis: K21.9

## 2016-01-09 LAB — URINALYSIS, ROUTINE W REFLEX MICROSCOPIC
Bilirubin Urine: NEGATIVE
GLUCOSE, UA: NEGATIVE mg/dL
HGB URINE DIPSTICK: NEGATIVE
Ketones, ur: NEGATIVE mg/dL
Nitrite: NEGATIVE
Protein, ur: NEGATIVE mg/dL
pH: 5.5 (ref 5.0–8.0)

## 2016-01-09 LAB — URINE MICROSCOPIC-ADD ON

## 2016-01-09 NOTE — MAU Note (Signed)
Pt c/o irregular contractions that started yesterday and vaginal pressure x1 month-pressure much worse today. Denies LOF, vag bleeding. +FM. Gets PNC at HD.

## 2016-01-09 NOTE — Discharge Instructions (Signed)
Braxton Hicks Contractions °Contractions of the uterus can occur throughout pregnancy. Contractions are not always a sign that you are in labor.  °WHAT ARE BRAXTON HICKS CONTRACTIONS?  °Contractions that occur before labor are called Braxton Hicks contractions, or false labor. Toward the end of pregnancy (32-34 weeks), these contractions can develop more often and may become more forceful. This is not true labor because these contractions do not result in opening (dilatation) and thinning of the cervix. They are sometimes difficult to tell apart from true labor because these contractions can be forceful and people have different pain tolerances. You should not feel embarrassed if you go to the hospital with false labor. Sometimes, the only way to tell if you are in true labor is for your health care provider to look for changes in the cervix. °If there are no prenatal problems or other health problems associated with the pregnancy, it is completely safe to be sent home with false labor and await the onset of true labor. °HOW CAN YOU TELL THE DIFFERENCE BETWEEN TRUE AND FALSE LABOR? °False Labor °· The contractions of false labor are usually shorter and not as hard as those of true labor.   °· The contractions are usually irregular.   °· The contractions are often felt in the front of the lower abdomen and in the groin.   °· The contractions may go away when you walk around or change positions while lying down.   °· The contractions get weaker and are shorter lasting as time goes on.   °· The contractions do not usually become progressively stronger, regular, and closer together as with true labor.   °True Labor °· Contractions in true labor last 30-70 seconds, become very regular, usually become more intense, and increase in frequency.   °· The contractions do not go away with walking.   °· The discomfort is usually felt in the top of the uterus and spreads to the lower abdomen and low back.   °· True labor can be  determined by your health care provider with an exam. This will show that the cervix is dilating and getting thinner.   °WHAT TO REMEMBER °· Keep up with your usual exercises and follow other instructions given by your health care provider.   °· Take medicines as directed by your health care provider.   °· Keep your regular prenatal appointments.   °· Eat and drink lightly if you think you are going into labor.   °· If Braxton Hicks contractions are making you uncomfortable:   °¨ Change your position from lying down or resting to walking, or from walking to resting.   °¨ Sit and rest in a tub of warm water.   °¨ Drink 2-3 glasses of water. Dehydration may cause these contractions.   °¨ Do slow and deep breathing several times an hour.   °WHEN SHOULD I SEEK IMMEDIATE MEDICAL CARE? °Seek immediate medical care if: °· Your contractions become stronger, more regular, and closer together.   °· You have fluid leaking or gushing from your vagina.   °· You have a fever.   °· You pass blood-tinged mucus.   °· You have vaginal bleeding.   °· You have continuous abdominal pain.   °· You have low back pain that you never had before.   °· You feel your baby's head pushing down and causing pelvic pressure.   °· Your baby is not moving as much as it used to.   °  °This information is not intended to replace advice given to you by your health care provider. Make sure you discuss any questions you have with your health care   provider. °  °Document Released: 06/05/2005 Document Revised: 06/10/2013 Document Reviewed: 03/17/2013 °Elsevier Interactive Patient Education ©2016 [employer] ° °

## 2016-01-09 NOTE — MAU Provider Note (Signed)
History   [id number]   Chief Complaint  Patient presents with  . Contractions    HPI Victoria Schneider is a 35 y.o. female  [phone] at [redacted]w[redacted]d IUP here with report of increased fetal movement one month ago.  Reports feeling lower pressure since then.  Also reports feeling crampy in lower abdomen.  Sensation occurs approximately 4x/hour.  Denies vaginal bleeding or leaking of fluid.  Rece[facility] at [facility].  Reports increased urinary frequency at night, no report of dysuria.     No report of headache, vision changes, or epigastric pain.  Reports generalized itching at night.  No report of itching on palms of hand and feet.    Patient's last menstrual period was 05/15/2015.  OB History  Gravida Para Term Preterm AB Living  4 3 3     3   SAB TAB Ectopic Multiple Live Births               # Outcome Date GA Lbr Len/2nd Weight Sex Delivery Anes PTL Lv  4 Current           3 Term 10/01/12 [redacted]w[redacted]d 32:50 / 00:19 7 lb 12.9 oz (3.54 kg) M Vag-Spont EPI  LIV  2 Term 2006    M Vag-Spont EPI    1 Term 2000    F Vag-Spont EPI        Past Medical History:  Diagnosis Date  . GERD (gastroesophageal reflux disease)   . Medical history non-contributory     Family History  Problem Relation Age of Onset  . Diabetes Father   . Diabetes Brother     Social History   Social History  . Marital status: Single    Spouse name: N/A  . Number of children: N/A  . Years of education: N/A   Social History Main Topics  . Smoking status: Never Smoker  . Smokeless tobacco: Never Used  . Alcohol use No     Comment: rarely  . Drug use: No  . Sexual activity: Yes    Birth control/ protection: None   Other Topics Concern  . None   Social History Narrative  . None    Allergies  Allergen Reactions  . Penicillins Rash    No current facility-administered medications on file prior to encounter.    Current Outpatient Prescriptions on File Prior to Encounter  Medication Sig Dispense Refill   . acetaminophen (TYLENOL) 500 MG tablet Take 1,000 mg by mouth every 6 (six) hours as needed for mild pain or headache.    . oseltamivir (TAMIFLU) 75 MG capsule Take 1 capsule (75 mg total) by mouth every 12 (twelve) hours. 10 capsule 0  . promethazine-codeine (PHENERGAN WITH CODEINE) 6.25-10 MG/5ML syrup Take 5 mLs by mouth every 6 (six) hours as needed for cough. 120 mL 0     Review of Systems  Gastrointestinal: Negative for abdominal pain.  Genitourinary: Positive for frequency and pelvic pain (pelvic pressure; cramping). Negative for dysuria, hematuria, vaginal bleeding and vaginal discharge.  All other systems reviewed and are negative.    Physical Exam   Vitals:   01/09/16 2020 01/09/16 2143  BP: 144/78 124/81  Pulse: 97 90  Resp: 18   Temp: 97.9 F (36.6 C)   TempSrc: Oral   SpO2: 100%   Weight: 217 lb (98.4 kg)   Height: 5\' 5"  (1.651 m)     Physical Exam  Constitutional: She is oriented to person, place, and time. She appears well-developed and well-nourished.  HENT:  Head: Normocephalic.  Neck: Normal range of motion. Neck supple.  Cardiovascular: Normal rate, regular rhythm and normal heart sounds.   Respiratory: Effort normal and breath sounds normal. No respiratory distress.  GI: Soft. There is no tenderness.  Genitourinary: There is bleeding in the vagina.  Musculoskeletal: Normal range of motion. She exhibits no edema.  Neurological: She is alert and oriented to person, place, and time.  Skin: Skin is warm and dry.  Dilation: Closed Effacement (%): Thick Cervical Position: Posterior FHR 140'.s, +accels, no decel  MAU Course  Procedures  MDM Results for orders placed or performed during the hospital encounter of 01/09/16 (from the past 24 hour(s))  Urinalysis, Routine w reflex microscopic (not at [facility])     Status: Abnormal   Collection Time: 01/09/16  8:21 PM  Result Value Ref Range   Color, Urine YELLOW YELLOW   APPearance CLEAR CLEAR   Specific  Gravity, Urine <1.005 (L) 1.005 - 1.030   pH 5.5 5.0 - 8.0   Glucose, UA NEGATIVE NEGATIVE mg/dL   Hgb urine dipstick NEGATIVE NEGATIVE   Bilirubin Urine NEGATIVE NEGATIVE   Ketones, ur NEGATIVE NEGATIVE mg/dL   Protein, ur NEGATIVE NEGATIVE mg/dL   Nitrite NEGATIVE NEGATIVE   Leukocytes, UA TRACE (A) NEGATIVE  Urine microscopic-add on     Status: Abnormal   Collection Time: 01/09/16  8:21 PM  Result Value Ref Range   Squamous Epithelial / LPF 0-5 (A) NONE SEEN   WBC, UA 0-5 0 - 5 WBC/hpf   RBC / HPF 0-5 0 - 5 RBC/hpf   Bacteria, UA RARE (A) NONE SEEN     Assessment and Plan  35 y.o. [id number] at [redacted]w[redacted]d IUP  Braxton Hicks Reactive NST  Plan: Discharge to home Reviewed labor precautions Given info regarding skin care and sleep hygiene Keep scheduled OB appt  Marlis Edelson, CNM 01/09/2016 9:31 PM

## 2016-02-01 LAB — OB RESULTS CONSOLE GBS: STREP GROUP B AG: POSITIVE

## 2016-02-08 ENCOUNTER — Inpatient Hospital Stay (HOSPITAL_COMMUNITY)
Admission: AD | Admit: 2016-02-08 | Discharge: 2016-02-08 | Disposition: A | Discharge status: Home / Self Care | Payer: Medicaid Other | Source: Ambulatory Visit | Attending: Family Medicine | Admitting: Family Medicine

## 2016-02-08 ENCOUNTER — Encounter (HOSPITAL_COMMUNITY): Payer: Self-pay

## 2016-02-08 DIAGNOSIS — O99513 Diseases of the respiratory system complicating pregnancy, third trimester: Secondary | ICD-10-CM | POA: Diagnosis not present

## 2016-02-08 DIAGNOSIS — Z3A38 38 weeks gestation of pregnancy: Secondary | ICD-10-CM | POA: Diagnosis not present

## 2016-02-08 DIAGNOSIS — Z833 Family history of diabetes mellitus: Secondary | ICD-10-CM | POA: Insufficient documentation

## 2016-02-08 DIAGNOSIS — O26893 Other specified pregnancy related conditions, third trimester: Secondary | ICD-10-CM | POA: Insufficient documentation

## 2016-02-08 DIAGNOSIS — J069 Acute upper respiratory infection, unspecified: Secondary | ICD-10-CM | POA: Diagnosis not present

## 2016-02-08 DIAGNOSIS — Z880 Allergy status to penicillin: Secondary | ICD-10-CM | POA: Diagnosis not present

## 2016-02-08 DIAGNOSIS — H65191 Other acute nonsuppurative otitis media, right ear: Secondary | ICD-10-CM | POA: Insufficient documentation

## 2016-02-08 DIAGNOSIS — R05 Cough: Secondary | ICD-10-CM | POA: Diagnosis present

## 2016-02-08 MED ORDER — AZITHROMYCIN 250 MG PO TABS: ORAL_TABLET | ORAL | 0 refills | Status: DC

## 2016-02-08 MED ORDER — PSEUDOEPHEDRINE HCL 30 MG PO TABS: 60.0000 mg | ORAL_TABLET | ORAL | 0 refills | Status: DC | PRN

## 2016-02-08 NOTE — MAU Note (Signed)
Urine in lab 

## 2016-02-08 NOTE — MAU Note (Signed)
Patient presents with c/o of abdominal pain. [facility] told her baby was transverse and if the baby did not move she would need to be seen. Patient also states that she has been sick for the past 2 weeks and has had ear and throat pain. She was given a nasal decongestant but has had no relief. Patient denies any bleeding or LOF. Fetus active.

## 2016-02-08 NOTE — MAU Provider Note (Signed)
Chief Complaint:  No chief complaint on file.   None     HPI: Victoria Schneider is a 35 y.o. [phone] at 3238w3dwho presents to maternity admissions reporting nasal drainage, cough, swollen tonsils, and bilateral ear pain x 2 weeks. She has taken sudafed and tylenol which help a little but do not resolve symptoms.  She coughs at night when lying down from the drainage.  Symptoms are staying the same despite treatment. She is drinking lots of water.  She reports that family members have had similar symptoms plus fever but she denies fever. She reports good fetal movement, denies regular contractions, LOF, vaginal bleeding, vaginal itching/burning, urinary symptoms, dizziness, n/v, or fever/chills.    HPI  Past Medical History: Past Medical History:  Diagnosis Date  . GERD (gastroesophageal reflux disease)   . Medical history non-contributory     Past obstetric history: OB History  Gravida Para Term Preterm AB Living  4 3 3     3   SAB TAB Ectopic Multiple Live Births          1    # Outcome Date GA Lbr Len/2nd Weight Sex Delivery Anes PTL Lv  4 Current           3 Term 10/01/12 [id number] 32:50 / 00:19 7 lb 12.9 oz (3.54 kg) M Vag-Spont EPI  LIV  2 Term 2006    M Vag-Spont EPI    1 Term 2000    F Vag-Spont EPI        Past Surgical History: Past Surgical History:  Procedure Laterality Date  . NO PAST SURGERIES      Family History: Family History  Problem Relation Age of Onset  . Diabetes Father   . Diabetes Brother     Social History: Social History  Substance Use Topics  . Smoking status: Never Smoker  . Smokeless tobacco: Never Used  . Alcohol use No     Comment: rarely    Allergies:  Allergies  Allergen Reactions  . Penicillins Rash    Has patient had a PCN reaction causing immediate rash, facial/tongue/throat swelling, SOB or lightheadedness with hypotension: No Has patient had a PCN reaction causing severe rash involving mucus membranes or skin necrosis:  No Has patient had a PCN reaction that required hospitalization No Has patient had a PCN reaction occurring within the last 10 years: Yes If all of the above answers are "NO", then may proceed with Cephalosporin use.     Meds:  Prescriptions Prior to Admission  Medication Sig Dispense Refill Last Dose  . acetaminophen (TYLENOL) 500 MG tablet Take 1,000 mg by mouth every 6 (six) hours as needed for mild pain or headache.   02/08/2016 at Unknown time  . Prenatal Vit-Fe Fumarate-FA (PRENATAL MULTIVITAMIN) TABS tablet Take 1 tablet by mouth daily at 12 noon.   02/07/2016 at Unknown time  . [DISCONTINUED] pseudoephedrine (SUDAFED) 30 MG tablet Take 30 mg by mouth every 4 (four) hours as needed for congestion.   02/08/2016 at Unknown time    ROS:  Review of Systems  Constitutional: Negative for chills, fatigue and fever.  HENT: Positive for ear pain, postnasal drip, rhinorrhea and sore throat.   Respiratory: Positive for cough. Negative for shortness of breath.   Cardiovascular: Negative for chest pain.  Genitourinary: Negative for difficulty urinating, dysuria, flank pain, pelvic pain, vaginal bleeding, vaginal discharge and vaginal pain.  Neurological: Negative for dizziness and headaches.  Psychiatric/Behavioral: Negative.      I have  reviewed patient's Past Medical Hx, Surgical Hx, Family Hx, Social Hx, medications and allergies.   Physical Exam  Patient Vitals for the past 24 hrs:  BP Temp Temp src Pulse Resp  02/08/16 2031 129/77 97.9 F (36.6 C) Oral 95 17   Constitutional: Well-developed, well-nourished female in no acute distress.  Cardiovascular: normal rate Respiratory: normal effort Physical Examination: Ears - bilateral TM's and external ear canals normal, left ear canal with mild erythema, TM wnl, no edema; right ear canal with mild erythema, green/yellow exudate, TM wnl Nose - mucosal congestion and sinuses normal and nontender Mouth - mucous membranes moist, pharynx  normal without lesions and tonsils hypertrophied without exudate Neck - supple, no significant adenopathy Lymphatics - few small anterior cervical nodes GI: Abd soft, non-tender, gravid appropriate for gestational age.  MS: Extremities nontender, no edema, normal ROM Neurologic: Alert and oriented x 4.  GU: Neg CVAT.    FHT:  Baseline 135 , moderate variability, accelerations present, no decelerations Contractions: occasional, mild to palpation   Labs: No results found for this or any previous visit (from the past 24 hour(s)).    Imaging:  No results found.  MAU Course/MDM: Likely started as viral URI but some evidence of complicated infection with exudate in right ear, lymphadenopathy.  Will treat with azithromycin 500 mg x 1 days, 250 mg daily x 4 days (Z-pak).  Continue supportive therapy, Sudafed, and Tylenol.  May take benadryl at night.  Pt stable at time of discharge.  Assessment: 1. Acute upper respiratory infection   2. Acute nonsuppurative otitis media of right ear     Plan: Discharge home Labor precautions and fetal kick counts    Medication List    TAKE these medications   acetaminophen 500 MG tablet Commonly known as:  TYLENOL Take 1,000 mg by mouth every 6 (six) hours as needed for mild pain or headache.   azithromycin 250 MG tablet Commonly known as:  ZITHROMAX Take 2 tablets today then take one tablet daily   prenatal multivitamin Tabs tablet Take 1 tablet by mouth daily at 12 noon.   pseudoephedrine 30 MG tablet Commonly known as:  SUDAFED Take 2 tablets (60 mg total) by mouth every 4 (four) hours as needed for congestion. What changed:  how much to take       Sharen CounterLisa Leftwich-Kirby Certified Nurse-Midwife 02/08/2016 9:04 PM

## 2016-02-08 NOTE — Discharge Instructions (Signed)
Upper Respiratory Infection, Adult °Most upper respiratory infections (URIs) are a viral infection of the air passages leading to the lungs. A URI affects the nose, throat, and upper air passages. The most common type of URI is nasopharyngitis and is typically referred to as "the common cold." °URIs run their course and usually go away on their own. Most of the time, a URI does not require medical attention, but sometimes a bacterial infection in the upper airways can follow a viral infection. This is called a secondary infection. Sinus and middle ear infections are common types of secondary upper respiratory infections. °Bacterial pneumonia can also complicate a URI. A URI can worsen asthma and chronic obstructive pulmonary disease (COPD). Sometimes, these complications can require emergency medical care and may be life threatening.  °CAUSES °Almost all URIs are caused by viruses. A virus is a type of germ and can spread from one person to another.  °RISKS FACTORS °You may be at risk for a URI if:  °· You smoke.   °· You have chronic heart or lung disease. °· You have a weakened defense (immune) system.   °· You are very young or very old.   °· You have nasal allergies or asthma. °· You work in crowded or poorly ventilated areas. °· You work in health care facilities or schools. °SIGNS AND SYMPTOMS  °Symptoms typically develop 2-3 days after you come in contact with a cold virus. Most viral URIs last 7-10 days. However, viral URIs from the influenza virus (flu virus) can last 14-18 days and are typically more severe. Symptoms may include:  °· Runny or stuffy (congested) nose.   °· Sneezing.   °· Cough.   °· Sore throat.   °· Headache.   °· Fatigue.   °· Fever.   °· Loss of appetite.   °· Pain in your forehead, behind your eyes, and over your cheekbones (sinus pain). °· Muscle aches.   °DIAGNOSIS  °Your health care provider may diagnose a URI by: °· Physical exam. °· Tests to check that your symptoms are not due to  another condition such as: °· Strep throat. °· Sinusitis. °· Pneumonia. °· Asthma. °TREATMENT  °A URI goes away on its own with time. It cannot be cured with medicines, but medicines may be prescribed or recommended to relieve symptoms. Medicines may help: °· Reduce your fever. °· Reduce your cough. °· Relieve nasal congestion. °HOME CARE INSTRUCTIONS  °· Take medicines only as directed by your health care provider.   °· Gargle warm saltwater or take cough drops to comfort your throat as directed by your health care provider. °· Use a warm mist humidifier or inhale steam from a shower to increase air moisture. This may make it easier to breathe. °· Drink enough fluid to keep your urine clear or pale yellow.   °· Eat soups and other clear broths and maintain good nutrition.   °· Rest as needed.   °· Return to work when your temperature has returned to normal or as your health care provider advises. You may need to stay home longer to avoid infecting others. You can also use a face mask and careful hand washing to prevent spread of the virus. °· Increase the usage of your inhaler if you have asthma.   °· Do not use any tobacco products, including cigarettes, chewing tobacco, or electronic cigarettes. If you need help quitting, ask your health care provider. °PREVENTION  °The best way to protect yourself from getting a cold is to practice good hygiene.  °· Avoid oral or hand contact with people with cold   symptoms.   °· Wash your hands often if contact occurs.   °There is no clear evidence that vitamin C, vitamin E, echinacea, or exercise reduces the chance of developing a cold. However, it is always recommended to get plenty of rest, exercise, and practice good nutrition.  °SEEK MEDICAL CARE IF:  °· You are getting worse rather than better.   °· Your symptoms are not controlled by medicine.   °· You have chills. °· You have worsening shortness of breath. °· You have brown or red mucus. °· You have yellow or brown nasal  discharge. °· You have pain in your face, especially when you bend forward. °· You have a fever. °· You have swollen neck glands. °· You have pain while swallowing. °· You have white areas in the back of your throat. °SEEK IMMEDIATE MEDICAL CARE IF:  °· You have severe or persistent: °¨ Headache. °¨ Ear pain. °¨ Sinus pain. °¨ Chest pain. °· You have chronic lung disease and any of the following: °¨ Wheezing. °¨ Prolonged cough. °¨ Coughing up blood. °¨ A change in your usual mucus. °· You have a stiff neck. °· You have changes in your: °¨ Vision. °¨ Hearing. °¨ Thinking. °¨ Mood. °MAKE SURE YOU:  °· Understand these instructions. °· Will watch your condition. °· Will get help right away if you are not doing well or get worse. °  °This information is not intended to replace advice given to you by your health care provider. Make sure you discuss any questions you have with your health care provider. °  °Document Released: 11/29/2000 Document Revised: 10/20/2014 Document Reviewed: 09/10/2013 °Elsevier Interactive Patient Education ©2016 [employer] ° °Otitis Media, Adult °Otitis media is redness, soreness, and inflammation of the middle ear. Otitis media may be caused by allergies or, most commonly, by infection. Often it occurs as a complication of the common cold. °SIGNS AND SYMPTOMS °Symptoms of otitis media may include: °· Earache. °· Fever. °· Ringing in your ear. °· Headache. °· Leakage of fluid from the ear. °DIAGNOSIS °To diagnose otitis media, your health care provider will examine your ear with an otoscope. This is an instrument that allows your health care provider to see into your ear in order to examine your eardrum. Your health care provider also will ask you questions about your symptoms. °TREATMENT  °Typically, otitis media resolves on its own within 3-5 days. Your health care provider may prescribe medicine to ease your symptoms of pain. If otitis media does not resolve within 5 days or is  recurrent, your health care provider may prescribe antibiotic medicines if he or she suspects that a bacterial infection is the cause. °HOME CARE INSTRUCTIONS  °· If you were prescribed an antibiotic medicine, finish it all even if you start to feel better. °· Take medicines only as directed by your health care provider. °· Keep all follow-up visits as directed by your health care provider. °SEEK MEDICAL CARE IF: °· You have otitis media only in one ear, or bleeding from your nose, or both. °· You notice a lump on your neck. °· You are not getting better in 3-5 days. °· You feel worse instead of better. °SEEK IMMEDIATE MEDICAL CARE IF:  °· You have pain that is not controlled with medicine. °· You have swelling, redness, or pain around your ear or stiffness in your neck. °· You notice that part of your face is paralyzed. °· You notice that the bone behind your ear (mastoid) is tender when you touch   it. °MAKE SURE YOU:  °· Understand these instructions. °· Will watch your condition. °· Will get help right away if you are not doing well or get worse. °  °This information is not intended to replace advice given to you by your health care provider. Make sure you discuss any questions you have with your health care provider. °  °Document Released: 03/10/2004 Document Revised: 06/26/2014 Document Reviewed: 12/31/2012 °Elsevier Interactive Patient Education ©2016 [employer] ° °

## 2016-02-11 ENCOUNTER — Encounter (HOSPITAL_COMMUNITY): Payer: Self-pay | Admitting: Certified Nurse Midwife

## 2016-02-11 ENCOUNTER — Inpatient Hospital Stay (HOSPITAL_COMMUNITY)
Admission: AD | Admit: 2016-02-11 | Discharge: 2016-02-11 | Disposition: A | Discharge status: Home / Self Care | Payer: Medicaid Other | Source: Ambulatory Visit | Attending: Obstetrics & Gynecology | Admitting: Obstetrics & Gynecology

## 2016-02-11 DIAGNOSIS — O99613 Diseases of the digestive system complicating pregnancy, third trimester: Secondary | ICD-10-CM | POA: Diagnosis present

## 2016-02-11 DIAGNOSIS — Z3A38 38 weeks gestation of pregnancy: Secondary | ICD-10-CM | POA: Insufficient documentation

## 2016-02-11 DIAGNOSIS — R300 Dysuria: Secondary | ICD-10-CM | POA: Insufficient documentation

## 2016-02-11 DIAGNOSIS — Z79899 Other long term (current) drug therapy: Secondary | ICD-10-CM | POA: Insufficient documentation

## 2016-02-11 DIAGNOSIS — Z880 Allergy status to penicillin: Secondary | ICD-10-CM | POA: Insufficient documentation

## 2016-02-11 DIAGNOSIS — O471 False labor at or after 37 completed weeks of gestation: Secondary | ICD-10-CM | POA: Insufficient documentation

## 2016-02-11 DIAGNOSIS — O479 False labor, unspecified: Secondary | ICD-10-CM

## 2016-02-11 DIAGNOSIS — J09X2 Influenza due to identified novel influenza A virus with other respiratory manifestations: Secondary | ICD-10-CM

## 2016-02-11 LAB — URINALYSIS, ROUTINE W REFLEX MICROSCOPIC
Bilirubin Urine: NEGATIVE
GLUCOSE, UA: NEGATIVE mg/dL
Hgb urine dipstick: NEGATIVE
KETONES UR: NEGATIVE mg/dL
LEUKOCYTES UA: NEGATIVE
Nitrite: NEGATIVE
PROTEIN: NEGATIVE mg/dL
Specific Gravity, Urine: 1.025 (ref 1.005–1.030)
pH: 6.5 (ref 5.0–8.0)

## 2016-02-11 LAB — WET PREP, GENITAL
Clue Cells Wet Prep HPF POC: NONE SEEN
SPERM: NONE SEEN
Trich, Wet Prep: NONE SEEN
WBC WET PREP: NONE SEEN
YEAST WET PREP: NONE SEEN

## 2016-02-11 LAB — POCT FERN TEST: POCT Fern Test: NEGATIVE

## 2016-02-11 LAB — OB RESULTS CONSOLE GBS: GBS: POSITIVE

## 2016-02-11 NOTE — MAU Provider Note (Signed)
History     CSN: [id number]  Arrival date and time: 02/11/16 1037   None     Chief Complaint  Patient presents with  . Contractions  . ? rupture of membranes    Victoria Schneider is a 35 y.o. [phone] at [redacted]w[redacted]d who presents with c/o leaking and contractions. States yesterday started having urinary frequency and dysuria without hematuria, then last night started having mild contractions that woke up her from sleep. States leaking is more like vaginal discharge, noticed some increased clear vaginal discharge this am, was concerned. Also endorses some mild back pain that she does not know if is from walking a lot at the mall or from contractions. Has noticed a funny smell but is unsure if coming from urine or vaginal discharge. States has been months since last sexually active and has no concerns for STD. Denies vaginal bleeding. Endorses good fetal movement.     OB History    Gravida Para Term Preterm AB Living   4 3 3     3    SAB TAB Ectopic Multiple Live Births           1      Past Medical History:  Diagnosis Date  . GERD (gastroesophageal reflux disease)   . Medical history non-contributory     Past Surgical History:  Procedure Laterality Date  . NO PAST SURGERIES      Family History  Problem Relation Age of Onset  . Diabetes Father   . Diabetes Brother     Social History  Substance Use Topics  . Smoking status: Never Smoker  . Smokeless tobacco: Never Used  . Alcohol use No     Comment: rarely    Allergies:  Allergies  Allergen Reactions  . Penicillins Rash    Has patient had a PCN reaction causing immediate rash, facial/tongue/throat swelling, SOB or lightheadedness with hypotension: No Has patient had a PCN reaction causing severe rash involving mucus membranes or skin necrosis: No Has patient had a PCN reaction that required hospitalization No Has patient had a PCN reaction occurring within the last 10 years: Yes If all of the above answers are "NO",  then may proceed with Cephalosporin use.     Prescriptions Prior to Admission  Medication Sig Dispense Refill Last Dose  . acetaminophen (TYLENOL) 500 MG tablet Take 1,000 mg by mouth every 6 (six) hours as needed for mild pain or headache.   02/08/2016 at Unknown time  . azithromycin (ZITHROMAX) 250 MG tablet Take 2 tablets today then take one tablet daily 6 tablet 0   . Prenatal Vit-Fe Fumarate-FA (PRENATAL MULTIVITAMIN) TABS tablet Take 1 tablet by mouth daily at 12 noon.   02/07/2016 at Unknown time  . pseudoephedrine (SUDAFED) 30 MG tablet Take 2 tablets (60 mg total) by mouth every 4 (four) hours as needed for congestion. 30 tablet 0     Review of Systems  Constitutional: Negative for chills and fever.  Eyes: Negative for blurred vision and double vision.  Respiratory: Negative for shortness of breath.   Cardiovascular: Negative for chest pain.  Gastrointestinal: Positive for abdominal pain (mild b/l lower).  Genitourinary: Positive for dysuria and frequency. Negative for hematuria and urgency.  Musculoskeletal: Positive for back pain (mild b/l lower ).  Neurological: Negative for headaches.   Physical Exam   Blood pressure 135/87, pulse 90, temperature 98.5 F (36.9 C), temperature source Oral, resp. rate 18, last menstrual period 05/15/2015, unknown if currently breastfeeding.  Physical Exam  Constitutional: She is oriented to person, place, and time. She appears well-developed and well-nourished. No distress.  HENT:  Head: Normocephalic and atraumatic.  Eyes: EOM are normal.  Cardiovascular: Normal rate, regular rhythm and normal heart sounds.   No murmur heard. Respiratory: Effort normal and breath sounds normal. No respiratory distress. She has no wheezes.  GI: Soft. Bowel sounds are normal. There is tenderness (mild TTP b/l lower). There is no rebound and no guarding.  gravid  Neurological: She is alert and oriented to person, place, and time.  Skin: Skin is warm and  dry.  Psychiatric: She has a normal mood and affect.  Cervical exam per RN: dry 1.5/50-3  MAU Course  Procedures  MDM:  Neg fern Wet [place] neg UA neg Reactive NST  Assessment and Plan  IUP@38 .6 Braxton Hicks contractions - discharge to home - encouraged good po hydration, given return precautions and labor precautions  Leland HerElsia J Yoo PGY-1 02/11/2016, 11:31 AM   OB FELLOW MAU ATTESTATION  I have seen and examined this patient; I agree with above documentation in the resident's note.    Ernestina Pennaicholas Schenk 02/11/2016, 1:13 PM

## 2016-02-11 NOTE — Discharge Instructions (Signed)
Braxton Hicks Contractions °Contractions of the uterus can occur throughout pregnancy. Contractions are not always a sign that you are in labor.  °WHAT ARE BRAXTON HICKS CONTRACTIONS?  °Contractions that occur before labor are called Braxton Hicks contractions, or false labor. Toward the end of pregnancy (32-34 weeks), these contractions can develop more often and may become more forceful. This is not true labor because these contractions do not result in opening (dilatation) and thinning of the cervix. They are sometimes difficult to tell apart from true labor because these contractions can be forceful and people have different pain tolerances. You should not feel embarrassed if you go to the hospital with false labor. Sometimes, the only way to tell if you are in true labor is for your health care provider to look for changes in the cervix. °If there are no prenatal problems or other health problems associated with the pregnancy, it is completely safe to be sent home with false labor and await the onset of true labor. °HOW CAN YOU TELL THE DIFFERENCE BETWEEN TRUE AND FALSE LABOR? °False Labor °· The contractions of false labor are usually shorter and not as hard as those of true labor.   °· The contractions are usually irregular.   °· The contractions are often felt in the front of the lower abdomen and in the groin.   °· The contractions may go away when you walk around or change positions while lying down.   °· The contractions get weaker and are shorter lasting as time goes on.   °· The contractions do not usually become progressively stronger, regular, and closer together as with true labor.   °True Labor °· Contractions in true labor last 30-70 seconds, become very regular, usually become more intense, and increase in frequency.   °· The contractions do not go away with walking.   °· The discomfort is usually felt in the top of the uterus and spreads to the lower abdomen and low back.   °· True labor can be  determined by your health care provider with an exam. This will show that the cervix is dilating and getting thinner.   °WHAT TO REMEMBER °· Keep up with your usual exercises and follow other instructions given by your health care provider.   °· Take medicines as directed by your health care provider.   °· Keep your regular prenatal appointments.   °· Eat and drink lightly if you think you are going into labor.   °· If Braxton Hicks contractions are making you uncomfortable:   °¨ Change your position from lying down or resting to walking, or from walking to resting.   °¨ Sit and rest in a tub of warm water.   °¨ Drink 2-3 glasses of water. Dehydration may cause these contractions.   °¨ Do slow and deep breathing several times an hour.   °WHEN SHOULD I SEEK IMMEDIATE MEDICAL CARE? °Seek immediate medical care if: °· Your contractions become stronger, more regular, and closer together.   °· You have fluid leaking or gushing from your vagina.   °· You have a fever.   °· You pass blood-tinged mucus.   °· You have vaginal bleeding.   °· You have continuous abdominal pain.   °· You have low back pain that you never had before.   °· You feel your baby's head pushing down and causing pelvic pressure.   °· Your baby is not moving as much as it used to.   °  °This information is not intended to replace advice given to you by your health care provider. Make sure you discuss any questions you have with your health care   provider. °  °Document Released: 06/05/2005 Document Revised: 06/10/2013 Document Reviewed: 03/17/2013 °Elsevier Interactive Patient Education ©2016 [employer] ° °

## 2016-02-11 NOTE — MAU Note (Signed)
Been having contractions all night.   Has not been timing, but seem like they have been getting stronger and closer.  Has been going to bathroom a lot, bottom has been itching, ? Leaking started about 0700 yesterday. No bleeding.  No recent exams

## 2016-02-13 ENCOUNTER — Encounter (HOSPITAL_COMMUNITY): Payer: Self-pay | Admitting: *Deleted

## 2016-02-13 ENCOUNTER — Inpatient Hospital Stay (HOSPITAL_COMMUNITY)
Admission: AD | Admit: 2016-02-13 | Discharge: 2016-02-13 | Disposition: A | Discharge status: Home / Self Care | Payer: Medicaid Other | Source: Ambulatory Visit | Attending: Obstetrics and Gynecology | Admitting: Obstetrics and Gynecology

## 2016-02-13 DIAGNOSIS — J09X2 Influenza due to identified novel influenza A virus with other respiratory manifestations: Secondary | ICD-10-CM

## 2016-02-13 DIAGNOSIS — O471 False labor at or after 37 completed weeks of gestation: Secondary | ICD-10-CM | POA: Diagnosis present

## 2016-02-13 NOTE — MAU Note (Signed)
Pt reports contractions since yesterday, worsening. Denies bleeding or ROM.

## 2016-02-13 NOTE — MAU Note (Signed)
Pt. States contractions are stronger and more uncomfortable. Denies LOF or bleeding. Next appointment is Wednesday. Last SVE was 2-3 days ago and was 2cm. Baby is moving well.

## 2016-02-13 NOTE — MAU Note (Signed)
Urine sent to lab 

## 2016-02-13 NOTE — Discharge Instructions (Signed)
Fetal Movement Counts °Patient Name: __________________________________________________ Patient Due Date: ____________________ °Performing a fetal movement count is highly recommended in high-risk pregnancies, but it is good for every pregnant woman to do. Your health care provider may ask you to start counting fetal movements at 28 weeks of the pregnancy. Fetal movements often increase: °· After eating a full meal. °· After physical activity. °· After eating or drinking something sweet or cold. °· At rest. °Pay attention to when you feel the baby is most active. This will help you notice a pattern of your baby's sleep and wake cycles and what factors contribute to an increase in fetal movement. It is important to perform a fetal movement count at the same time each day when your baby is normally most active.  °HOW TO COUNT FETAL MOVEMENTS °1. Find a quiet and comfortable area to sit or lie down on your left side. Lying on your left side provides the best blood and oxygen circulation to your baby. °2. Write down the day and time on a sheet of paper or in a journal. °3. Start counting kicks, flutters, swishes, rolls, or jabs in a 2-hour period. You should feel at least 10 movements within 2 hours. °4. If you do not feel 10 movements in 2 hours, wait 2-3 hours and count again. Look for a change in the pattern or not enough counts in 2 hours. °SEEK MEDICAL CARE IF: °· You feel less than 10 counts in 2 hours, tried twice. °· There is no movement in over an hour. °· The pattern is changing or taking longer each day to reach 10 counts in 2 hours. °· You feel the baby is not moving as he or she usually does. °Date: ____________ Movements: ____________ Start time: ____________ Finish time: ____________  °Date: ____________ Movements: ____________ Start time: ____________ Finish time: ____________ °Date: ____________ Movements: ____________ Start time: ____________ Finish time: ____________ °Date: ____________ Movements:  ____________ Start time: ____________ Finish time: ____________ °Date: ____________ Movements: ____________ Start time: ____________ Finish time: ____________ °Date: ____________ Movements: ____________ Start time: ____________ Finish time: ____________ °Date: ____________ Movements: ____________ Start time: ____________ Finish time: ____________ °Date: ____________ Movements: ____________ Start time: ____________ Finish time: ____________  °Date: ____________ Movements: ____________ Start time: ____________ Finish time: ____________ °Date: ____________ Movements: ____________ Start time: ____________ Finish time: ____________ °Date: ____________ Movements: ____________ Start time: ____________ Finish time: ____________ °Date: ____________ Movements: ____________ Start time: ____________ Finish time: ____________ °Date: ____________ Movements: ____________ Start time: ____________ Finish time: ____________ °Date: ____________ Movements: ____________ Start time: ____________ Finish time: ____________ °Date: ____________ Movements: ____________ Start time: ____________ Finish time: ____________  °Date: ____________ Movements: ____________ Start time: ____________ Finish time: ____________ °Date: ____________ Movements: ____________ Start time: ____________ Finish time: ____________ °Date: ____________ Movements: ____________ Start time: ____________ Finish time: ____________ °Date: ____________ Movements: ____________ Start time: ____________ Finish time: ____________ °Date: ____________ Movements: ____________ Start time: ____________ Finish time: ____________ °Date: ____________ Movements: ____________ Start time: ____________ Finish time: ____________ °Date: ____________ Movements: ____________ Start time: ____________ Finish time: ____________  °Date: ____________ Movements: ____________ Start time: ____________ Finish time: ____________ °Date: ____________ Movements: ____________ Start time: ____________ Finish  time: ____________ °Date: ____________ Movements: ____________ Start time: ____________ Finish time: ____________ °Date: ____________ Movements: ____________ Start time: ____________ Finish time: ____________ °Date: ____________ Movements: ____________ Start time: ____________ Finish time: ____________ °Date: ____________ Movements: ____________ Start time: ____________ Finish time: ____________ °Date: ____________ Movements: ____________ Start time: ____________ Finish time: ____________  °Date: ____________ Movements: ____________ Start time: ____________ Finish   time: ____________ °Date: ____________ Movements: ____________ Start time: ____________ Finish time: ____________ °Date: ____________ Movements: ____________ Start time: ____________ Finish time: ____________ °Date: ____________ Movements: ____________ Start time: ____________ Finish time: ____________ °Date: ____________ Movements: ____________ Start time: ____________ Finish time: ____________ °Date: ____________ Movements: ____________ Start time: ____________ Finish time: ____________ °Date: ____________ Movements: ____________ Start time: ____________ Finish time: ____________  °Date: ____________ Movements: ____________ Start time: ____________ Finish time: ____________ °Date: ____________ Movements: ____________ Start time: ____________ Finish time: ____________ °Date: ____________ Movements: ____________ Start time: ____________ Finish time: ____________ °Date: ____________ Movements: ____________ Start time: ____________ Finish time: ____________ °Date: ____________ Movements: ____________ Start time: ____________ Finish time: ____________ °Date: ____________ Movements: ____________ Start time: ____________ Finish time: ____________ °Date: ____________ Movements: ____________ Start time: ____________ Finish time: ____________  °Date: ____________ Movements: ____________ Start time: ____________ Finish time: ____________ °Date: ____________  Movements: ____________ Start time: ____________ Finish time: ____________ °Date: ____________ Movements: ____________ Start time: ____________ Finish time: ____________ °Date: ____________ Movements: ____________ Start time: ____________ Finish time: ____________ °Date: ____________ Movements: ____________ Start time: ____________ Finish time: ____________ °Date: ____________ Movements: ____________ Start time: ____________ Finish time: ____________ °Date: ____________ Movements: ____________ Start time: ____________ Finish time: ____________  °Date: ____________ Movements: ____________ Start time: ____________ Finish time: ____________ °Date: ____________ Movements: ____________ Start time: ____________ Finish time: ____________ °Date: ____________ Movements: ____________ Start time: ____________ Finish time: ____________ °Date: ____________ Movements: ____________ Start time: ____________ Finish time: ____________ °Date: ____________ Movements: ____________ Start time: ____________ Finish time: ____________ °Date: ____________ Movements: ____________ Start time: ____________ Finish time: ____________ °  °This information is not intended to replace advice given to you by your health care provider. Make sure you discuss any questions you have with your health care provider. °  °Document Released: 07/05/2006 Document Revised: 06/26/2014 Document Reviewed: 04/01/2012 °Elsevier Interactive Patient Education ©2016 [employer] °Braxton Hicks Contractions °Contractions of the uterus can occur throughout pregnancy. Contractions are not always a sign that you are in labor.  °WHAT ARE BRAXTON HICKS CONTRACTIONS?  °Contractions that occur before labor are called Braxton Hicks contractions, or false labor. Toward the end of pregnancy (32-34 weeks), these contractions can develop more often and may become more forceful. This is not true labor because these contractions do not result in opening (dilatation) and thinning of  the cervix. They are sometimes difficult to tell apart from true labor because these contractions can be forceful and people have different pain tolerances. You should not feel embarrassed if you go to the hospital with false labor. Sometimes, the only way to tell if you are in true labor is for your health care provider to look for changes in the cervix. °If there are no prenatal problems or other health problems associated with the pregnancy, it is completely safe to be sent home with false labor and await the onset of true labor. °HOW CAN YOU TELL THE DIFFERENCE BETWEEN TRUE AND FALSE LABOR? °False Labor °· The contractions of false labor are usually shorter and not as hard as those of true labor.   °· The contractions are usually irregular.   °· The contractions are often felt in the front of the lower abdomen and in the groin.   °· The contractions may go away when you walk around or change positions while lying down.   °· The contractions get weaker and are shorter lasting as time goes on.   °· The contractions do not usually become progressively stronger, regular, and closer together as with true labor.   °True Labor °· Contractions in true   labor last 30-70 seconds, become very regular, usually become more intense, and increase in frequency.   °· The contractions do not go away with walking.   °· The discomfort is usually felt in the top of the uterus and spreads to the lower abdomen and low back.   °· True labor can be determined by your health care provider with an exam. This will show that the cervix is dilating and getting thinner.   °WHAT TO REMEMBER °· Keep up with your usual exercises and follow other instructions given by your health care provider.   °· Take medicines as directed by your health care provider.   °· Keep your regular prenatal appointments.   °· Eat and drink lightly if you think you are going into labor.   °· If Braxton Hicks contractions are making you uncomfortable:   °¨ Change your  position from lying down or resting to walking, or from walking to resting.   °¨ Sit and rest in a tub of warm water.   °¨ Drink 2-3 glasses of water. Dehydration may cause these contractions.   °¨ Do slow and deep breathing several times an hour.   °WHEN SHOULD I SEEK IMMEDIATE MEDICAL CARE? °Seek immediate medical care if: °· Your contractions become stronger, more regular, and closer together.   °· You have fluid leaking or gushing from your vagina.   °· You have a fever.   °· You pass blood-tinged mucus.   °· You have vaginal bleeding.   °· You have continuous abdominal pain.   °· You have low back pain that you never had before.   °· You feel your baby's head pushing down and causing pelvic pressure.   °· Your baby is not moving as much as it used to.   °  °This information is not intended to replace advice given to you by your health care provider. Make sure you discuss any questions you have with your health care provider. °  °Document Released: 06/05/2005 Document Revised: 06/10/2013 Document Reviewed: 03/17/2013 °[employer]vier Interactive Patient Education ©2016 [employer] ° °

## 2016-02-18 ENCOUNTER — Inpatient Hospital Stay (HOSPITAL_COMMUNITY)
Admission: AD | Admit: 2016-02-18 | Discharge: 2016-02-19 | Disposition: A | Discharge status: Home / Self Care | Payer: Medicaid Other | Source: Ambulatory Visit | Attending: Obstetrics & Gynecology | Admitting: Obstetrics & Gynecology

## 2016-02-18 DIAGNOSIS — K219 Gastro-esophageal reflux disease without esophagitis: Secondary | ICD-10-CM | POA: Insufficient documentation

## 2016-02-18 DIAGNOSIS — O26893 Other specified pregnancy related conditions, third trimester: Secondary | ICD-10-CM | POA: Insufficient documentation

## 2016-02-18 DIAGNOSIS — M940 Chondrocostal junction syndrome [Tietze]: Secondary | ICD-10-CM

## 2016-02-18 DIAGNOSIS — Z880 Allergy status to penicillin: Secondary | ICD-10-CM | POA: Insufficient documentation

## 2016-02-18 DIAGNOSIS — Z3A40 40 weeks gestation of pregnancy: Secondary | ICD-10-CM | POA: Insufficient documentation

## 2016-02-19 ENCOUNTER — Encounter (HOSPITAL_COMMUNITY): Payer: Self-pay

## 2016-02-19 DIAGNOSIS — Z880 Allergy status to penicillin: Secondary | ICD-10-CM | POA: Diagnosis not present

## 2016-02-19 DIAGNOSIS — M940 Chondrocostal junction syndrome [Tietze]: Secondary | ICD-10-CM | POA: Diagnosis not present

## 2016-02-19 DIAGNOSIS — K219 Gastro-esophageal reflux disease without esophagitis: Secondary | ICD-10-CM | POA: Diagnosis not present

## 2016-02-19 DIAGNOSIS — O26893 Other specified pregnancy related conditions, third trimester: Secondary | ICD-10-CM | POA: Diagnosis not present

## 2016-02-19 DIAGNOSIS — R0602 Shortness of breath: Secondary | ICD-10-CM | POA: Diagnosis present

## 2016-02-19 DIAGNOSIS — Z3A40 40 weeks gestation of pregnancy: Secondary | ICD-10-CM | POA: Diagnosis not present

## 2016-02-19 NOTE — Discharge Instructions (Signed)
Costocondritis (Costochondritis) La costocondritis es la hinchazn e irritacin del tejido (cartlago) que une las costillas con el hueso del trax (esternn). Esto causa dolor en el pecho y la zona de las [place]. Generalmente desaparece con Allied Waste Industriesel tiempo, sin tratamiento. CUIDADOS EN EL HOGAR  Evite las actividades que lo cansen [place].  No esfuerce sus costillas. Evite las Northwest Airlinesactividades en las que tenga que emplear:  OrtingEl pecho.  El vientre.  Los The ServiceMaster Companymsculos de los lados.  Aplique hielo en la zona durante los 2 primeros das despus del comienzo del dolor.  Ponga el hielo en una bolsa plstica.  Colquese una toalla entre la piel y la bolsa de hielo.  Deje el hielo durante 20 minutos, y aplquelo 2-3 veces por Futures traderda.  Slo tome los medicamentos que le haya indicado su mdico. SOLICITE AYUDA SI:  Tiene enrojecimiento e inflamacin (hinchazn) en la zona de las costillas.  El dolor no desaparece aunque haga reposo o tome medicamentos. SOLICITE AYUDA DE INMEDIATO SI:   El dolor empeora.  Siente muchas molestias.  Tiene dificultad para respirar.  Tose y escupe sangre.  Comienza a devolver (vomita).  Tiene fiebre o sntomas que persisten durante ms de 2-3 das.  Tiene fiebre y los sntomas empeoran repentinamente. ASEGRESE DE QUE:   Comprende estas instrucciones.  Controlar su afeccin.  Recibir ayuda de inmediato si no mejora o si empeora.   Esta informacin no tiene Theme park managercomo fin reemplazar el consejo del mdico. Asegrese de hacerle al mdico cualquier pregunta que tenga.   Document Released: 07/08/2010 Document Revised: 02/05/2013 Elsevier Interactive Patient Education [employer]2016 Elsevier Inc.

## 2016-02-19 NOTE — MAU Note (Addendum)
Having contractions since Thurs night and is hard to breathe when I have ctxs.  Denies LOF or bleeding. Clear mucous d/c. Also has had sore throat and cough for 8 days. Was seen here and given medicine and went away but now is back. Cough at nighttime

## 2016-02-19 NOTE — MAU Provider Note (Signed)
History     CSN: [id number]  Arrival date and time: 02/18/16 2340   None     Chief Complaint  Patient presents with  . Contractions   Victoria Schneider is a 35 y.o. [phone] at [redacted]w[redacted]d who presents with c/o chest pressure and SOB. States noticed was SOB last night when tried to lay down for sleep, felt like had chest pressure in the center of her chest, non-radiating, worse with movement, completely relieved by sitting up, rates 6 out or 10. Was unable to sleep due to discomfort. Had similar episode of SOB and central chest pressure tonight when she went to lay down for sleep. States has noticed has only occurred when laying down and is gone when she sits up, thinks is due to weight gain she has had during this pregnancy.    OB History    Gravida Para Term Preterm AB Living   4 3 3     3    SAB TAB Ectopic Multiple Live Births           1      Past Medical History:  Diagnosis Date  . GERD (gastroesophageal reflux disease)   . Medical history non-contributory     Past Surgical History:  Procedure Laterality Date  . NO PAST SURGERIES      Family History  Problem Relation Age of Onset  . Diabetes Father   . Diabetes Brother     Social History  Substance Use Topics  . Smoking status: Never Smoker  . Smokeless tobacco: Never Used  . Alcohol use No     Comment: rarely    Allergies:  Allergies  Allergen Reactions  . Penicillins Rash    Has patient had a PCN reaction causing immediate rash, facial/tongue/throat swelling, SOB or lightheadedness with hypotension: No Has patient had a PCN reaction causing severe rash involving mucus membranes or skin necrosis: No Has patient had a PCN reaction that required hospitalization No Has patient had a PCN reaction occurring within the last 10 years: Yes If all of the above answers are "NO", then may proceed with Cephalosporin use.     Prescriptions Prior to Admission  Medication Sig Dispense Refill Last Dose  .  acetaminophen (TYLENOL) 500 MG tablet Take 1,000 mg by mouth every 6 (six) hours as needed for mild pain or headache.   02/12/2016 at Unknown time  . Prenatal Vit-Fe Fumarate-FA (PRENATAL MULTIVITAMIN) TABS tablet Take 1 tablet by mouth daily at 12 noon.   02/12/2016 at Unknown time    Review of Systems  Constitutional: Negative for chills and fever.  Eyes: Negative for blurred vision and double vision.  Respiratory: Positive for shortness of breath. Negative for wheezing.   Cardiovascular: Positive for chest pain (central chest pressure). Negative for palpitations.  Gastrointestinal: Positive for abdominal pain (mild cramping b/l lower abdominal pain). Negative for constipation, diarrhea, heartburn, nausea and vomiting.  Genitourinary: Negative for dysuria.  Musculoskeletal: Positive for back pain (mild cramping lower back pain).  Neurological: Negative for headaches.   Physical Exam   Blood pressure 134/78, pulse 82, temperature 98.2 F (36.8 C), resp. rate 18, height 5\' 5"  (1.651 m), weight 101.7 kg (224 lb 3.2 oz), last menstrual period 05/15/2015, SpO2 99 %, unknown if currently breastfeeding.  Physical Exam  Constitutional: She is oriented to person, place, and time. She appears well-developed and well-nourished. No distress.  HENT:  Head: Normocephalic and atraumatic.  Eyes: EOM are normal.  Cardiovascular: Normal rate, regular rhythm and  normal heart sounds.   No murmur heard. Respiratory: Effort normal and breath sounds normal. No respiratory distress. She has no wheezes. She exhibits tenderness (along b/l costochondral junction).  GI: Soft. Bowel sounds are normal. There is no tenderness. There is no rebound and no guarding.  Neurological: She is alert and oriented to person, place, and time.  Skin: Skin is warm and dry.  Psychiatric: She has a normal mood and affect.    MAU Course  Procedures  MDM EKG showed normal sinus rhythm  Assessment and Plan  IUP @  40.0 Costochondritis - encouraged supportive measures - given return precautions  Leland HerElsia J Yoo PGY-1 02/19/2016, 1:28 AM   CNM attestation:  I have seen and examined this patient; I agree with above documentation in the resident's note.   Victoria CunasMaria G Schneider is a 35 y.o. [phone] reporting chest pressure when lying supine but is relieved when sitting. +FM, denies LOF, VB, contractions, vaginal discharge.  PE: BP 110/72 (BP Location: Right Arm)   Pulse 89   Temp 97.9 F (36.6 C) (Oral)   Resp 20   Ht 5\' 5"  (1.651 m)   Wt 101.7 kg (224 lb 3.2 oz)   LMP 05/15/2015   SpO2 99%   BMI 37.31 kg/m  Gen: calm comfortable, NAD Resp: normal effort, no distress Abd: gravid  ROS, labs, PMH reviewed NST reactive   Plan: -  labor precautions - reassured; encouraged to prop w/ pillows - continue routine follow up in OB clinic  Cam Schneider, Victoria, CNM 9:20 AM  02/19/2016

## 2016-02-22 ENCOUNTER — Encounter (HOSPITAL_COMMUNITY): Payer: Self-pay | Admitting: *Deleted

## 2016-02-22 ENCOUNTER — Inpatient Hospital Stay (HOSPITAL_COMMUNITY)
Admission: AD | Admit: 2016-02-22 | Discharge: 2016-02-22 | Disposition: A | Discharge status: Home / Self Care | Payer: Medicaid Other | Source: Ambulatory Visit | Attending: Family Medicine | Admitting: Family Medicine

## 2016-02-22 DIAGNOSIS — N898 Other specified noninflammatory disorders of vagina: Secondary | ICD-10-CM | POA: Diagnosis present

## 2016-02-22 DIAGNOSIS — O09523 Supervision of elderly multigravida, third trimester: Secondary | ICD-10-CM | POA: Diagnosis not present

## 2016-02-22 DIAGNOSIS — Z880 Allergy status to penicillin: Secondary | ICD-10-CM | POA: Diagnosis not present

## 2016-02-22 DIAGNOSIS — Z833 Family history of diabetes mellitus: Secondary | ICD-10-CM | POA: Diagnosis not present

## 2016-02-22 DIAGNOSIS — Z3A40 40 weeks gestation of pregnancy: Secondary | ICD-10-CM | POA: Diagnosis not present

## 2016-02-22 DIAGNOSIS — J09X2 Influenza due to identified novel influenza A virus with other respiratory manifestations: Secondary | ICD-10-CM

## 2016-02-22 DIAGNOSIS — O471 False labor at or after 37 completed weeks of gestation: Secondary | ICD-10-CM | POA: Insufficient documentation

## 2016-02-22 LAB — POCT FERN TEST: POCT Fern Test: NEGATIVE

## 2016-02-22 NOTE — MAU Note (Signed)
PT  SAYS SHE IS UNSURE  IF  SROM-    WHEN GOES  TO B-ROOM-  FLUID  KEEPS  COMING  OUT.   PNC- HD.     VE LAST WEEK -  DIDN'T TELL PT .   DENIES HSV AND  MRSA.   GBS- POSITIVE

## 2016-02-22 NOTE — MAU Note (Signed)
Contractions have spread out and been more infrequent. Will do spec exam to check for pooling of fluid.

## 2016-02-22 NOTE — MAU Provider Note (Signed)
[id number]   Chief Complaint  Patient presents with  . Labor Eval    HPI ALIVIANA BURDELL is a 35 y.o. female  [phone] here with report of watery vaginal discharge that began yesterday, soaked her underwear x 1, and continues but has not required a pad.   Pt reports irregular contractions and denies vaginal bleeding.  +fetal movement.   All other systems negative.    Patient's last menstrual period was 05/15/2015.  OB History  Gravida Para Term Preterm AB Living  4 3 3     3   SAB TAB Ectopic Multiple Live Births          3    # Outcome Date GA Lbr Len/2nd Weight Sex Delivery Anes PTL Lv  4 Current           3 Term 10/01/12 [redacted]w[redacted]d 32:50 / 00:19 7 lb 12.9 oz (3.54 kg) M Vag-Spont EPI  LIV  2 Term 2006    M Vag-Spont EPI    1 Term 2000    F Vag-Spont EPI        Past Medical History:  Diagnosis Date  . GERD (gastroesophageal reflux disease)   . Medical history non-contributory     Family History  Problem Relation Age of Onset  . Diabetes Father   . Diabetes Brother     Social History   Social History  . Marital status: Single    Spouse name: N/A  . Number of children: N/A  . Years of education: N/A   Social History Main Topics  . Smoking status: Never Smoker  . Smokeless tobacco: Never Used  . Alcohol use No     Comment: rarely  . Drug use: No  . Sexual activity: Yes    Birth control/ protection: None   Other Topics Concern  . None   Social History Narrative  . None    Allergies  Allergen Reactions  . Penicillins Rash    Has patient had a PCN reaction causing immediate rash, facial/tongue/throat swelling, SOB or lightheadedness with hypotension: No Has patient had a PCN reaction causing severe rash involving mucus membranes or skin necrosis: No Has patient had a PCN reaction that required hospitalization No Has patient had a PCN reaction occurring within the last 10 years: Yes If all of the above answers are "NO", then may proceed with  Cephalosporin use.     No current facility-administered medications on file prior to encounter.    Current Outpatient Prescriptions on File Prior to Encounter  Medication Sig Dispense Refill  . acetaminophen (TYLENOL) 500 MG tablet Take 1,000 mg by mouth every 6 (six) hours as needed for mild pain or headache.    . Prenatal Vit-Fe Fumarate-FA (PRENATAL MULTIVITAMIN) TABS tablet Take 1 tablet by mouth daily at 12 noon.       Review of Systems  Constitutional: Negative for chills, fatigue and fever.  Eyes: Negative for visual disturbance.  Respiratory: Negative for shortness of breath.   Cardiovascular: Negative for chest pain.  Gastrointestinal: Positive for abdominal pain. Negative for nausea and vomiting.  Genitourinary: Positive for pelvic pain and vaginal discharge. Negative for difficulty urinating, dysuria, flank pain, vaginal bleeding and vaginal pain.  Neurological: Negative for dizziness and headaches.  Psychiatric/Behavioral: Negative.      Physical Exam   Vitals:   02/22/16 1959  BP: 129/75  Pulse: 89  Resp: 20  Temp: 98 F (36.7 C)  TempSrc: Oral  Weight: 225 lb 12 oz (102.4 kg)  Height: 5\' 5"  (1.651 m)    Physical Exam  Nursing note and vitals reviewed. Constitutional: She is oriented to person, place, and time. She appears well-developed and well-nourished.  Neck: Normal range of motion.  Cardiovascular: Normal rate.   Respiratory: Effort normal.  GI: Soft.  Musculoskeletal: Normal range of motion.  Neurological: She is alert and oriented to person, place, and time.  Skin: Skin is warm and dry.  Psychiatric: She has a normal mood and affect. Her behavior is normal. Judgment and thought content normal.   Pelvic exam with negative pooling Slide taken with ferning negative  MAU Course  Procedures  MDM No evidence of ROM or onset of labor.  Pt to f/u in office as scheduled tomorrow.  Reviewed signs of labor/reasons to return to MAU.  Pt stable at  time of discharge.  Assessment and Plan  35 y.o. [id number] @[redacted]w[redacted]d  Threatened labor at term  D/C home with labor precautions and fetal kick counts F/U in office as scheduled IOL at 41 weeks   Hurshel PartyLisa A Leftwich-Kirby, CNM 02/22/2016 9:45 PM

## 2016-02-23 ENCOUNTER — Other Ambulatory Visit (HOSPITAL_COMMUNITY): Payer: Self-pay | Admitting: Nurse Practitioner

## 2016-02-23 DIAGNOSIS — O480 Post-term pregnancy: Secondary | ICD-10-CM

## 2016-02-24 ENCOUNTER — Ambulatory Visit (HOSPITAL_COMMUNITY)
Admission: RE | Admit: 2016-02-24 | Discharge: 2016-02-24 | Disposition: A | Discharge status: Home / Self Care | Payer: Medicaid Other | Source: Ambulatory Visit | Attending: Nurse Practitioner | Admitting: Nurse Practitioner

## 2016-02-24 ENCOUNTER — Other Ambulatory Visit (HOSPITAL_COMMUNITY): Payer: Self-pay | Admitting: Nurse Practitioner

## 2016-02-24 DIAGNOSIS — Z3A40 40 weeks gestation of pregnancy: Secondary | ICD-10-CM | POA: Diagnosis not present

## 2016-02-24 DIAGNOSIS — O480 Post-term pregnancy: Secondary | ICD-10-CM

## 2016-02-27 ENCOUNTER — Inpatient Hospital Stay (HOSPITAL_COMMUNITY): Payer: Medicaid Other | Admitting: Anesthesiology

## 2016-02-27 ENCOUNTER — Encounter (HOSPITAL_COMMUNITY): Payer: Self-pay

## 2016-02-27 ENCOUNTER — Inpatient Hospital Stay (HOSPITAL_COMMUNITY)
Admission: RE | Admit: 2016-02-27 | Discharge: 2016-02-29 | DRG: 775 | Disposition: A | Discharge status: Home / Self Care | Payer: Medicaid Other | Source: Ambulatory Visit | Attending: Obstetrics and Gynecology | Admitting: Obstetrics and Gynecology

## 2016-02-27 VITALS — BP 122/62 | HR 80 | Temp 97.8°F | Resp 18 | Ht 65.0 in | Wt 227.0 lb

## 2016-02-27 DIAGNOSIS — O480 Post-term pregnancy: Secondary | ICD-10-CM | POA: Diagnosis present

## 2016-02-27 DIAGNOSIS — K219 Gastro-esophageal reflux disease without esophagitis: Secondary | ICD-10-CM | POA: Diagnosis present

## 2016-02-27 DIAGNOSIS — Z3A41 41 weeks gestation of pregnancy: Secondary | ICD-10-CM | POA: Diagnosis not present

## 2016-02-27 DIAGNOSIS — O9962 Diseases of the digestive system complicating childbirth: Secondary | ICD-10-CM | POA: Diagnosis present

## 2016-02-27 DIAGNOSIS — Z3480 Encounter for supervision of other normal pregnancy, unspecified trimester: Secondary | ICD-10-CM

## 2016-02-27 DIAGNOSIS — J09X2 Influenza due to identified novel influenza A virus with other respiratory manifestations: Secondary | ICD-10-CM

## 2016-02-27 DIAGNOSIS — O99824 Streptococcus B carrier state complicating childbirth: Secondary | ICD-10-CM | POA: Diagnosis present

## 2016-02-27 DIAGNOSIS — Z833 Family history of diabetes mellitus: Secondary | ICD-10-CM | POA: Diagnosis not present

## 2016-02-27 DIAGNOSIS — IMO0001 Reserved for inherently not codable concepts without codable children: Secondary | ICD-10-CM

## 2016-02-27 LAB — TYPE AND SCREEN
ABO/RH(D): O POS
Antibody Screen: NEGATIVE

## 2016-02-27 LAB — CBC
HCT: 29.7 % — ABNORMAL LOW (ref 36.0–46.0)
Hemoglobin: 9.1 g/dL — ABNORMAL LOW (ref 12.0–15.0)
MCH: 21.8 pg — AB (ref 26.0–34.0)
MCHC: 30.6 g/dL (ref 30.0–36.0)
MCV: 71.1 fL — ABNORMAL LOW (ref 78.0–100.0)
PLATELETS: 284 10*3/uL (ref 150–400)
RBC: 4.18 MIL/uL (ref 3.87–5.11)
RDW: 16.7 % — ABNORMAL HIGH (ref 11.5–15.5)
WBC: 8.2 10*3/uL (ref 4.0–10.5)

## 2016-02-27 MED ORDER — CEFAZOLIN SODIUM-DEXTROSE 2-4 GM/100ML-% IV SOLN: 2.0000 g | Freq: Once | INTRAVENOUS | Status: DC

## 2016-02-27 MED ORDER — ACETAMINOPHEN 325 MG PO TABS
650.0000 mg | ORAL_TABLET | ORAL | Status: DC | PRN
  Administered 2016-02-27: 650 mg via ORAL
  Filled 2016-02-27: qty 2

## 2016-02-27 MED ORDER — CEFAZOLIN IN D5W 1 GM/50ML IV SOLN: 1.0000 g | Freq: Three times a day (TID) | INTRAVENOUS | Status: DC

## 2016-02-27 MED ORDER — FLEET ENEMA 7-19 GM/118ML RE ENEM: 1.0000 | ENEMA | RECTAL | Status: DC | PRN

## 2016-02-27 MED ORDER — COCONUT OIL OIL: 1.0000 "application " | TOPICAL_OIL | Status: DC | PRN

## 2016-02-27 MED ORDER — PHENYLEPHRINE 40 MCG/ML (10ML) SYRINGE FOR IV PUSH (FOR BLOOD PRESSURE SUPPORT)
80.0000 ug | PREFILLED_SYRINGE | INTRAVENOUS | Status: DC | PRN
  Filled 2016-02-27: qty 5

## 2016-02-27 MED ORDER — DIPHENHYDRAMINE HCL 50 MG/ML IJ SOLN: 12.5000 mg | INTRAMUSCULAR | Status: DC | PRN

## 2016-02-27 MED ORDER — ONDANSETRON HCL 4 MG PO TABS: 4.0000 mg | ORAL_TABLET | ORAL | Status: DC | PRN

## 2016-02-27 MED ORDER — SENNOSIDES-DOCUSATE SODIUM 8.6-50 MG PO TABS
2.0000 | ORAL_TABLET | ORAL | Status: DC
  Administered 2016-02-28 (×2): 2 via ORAL
  Filled 2016-02-27 (×2): qty 2

## 2016-02-27 MED ORDER — LIDOCAINE HCL (PF) 1 % IJ SOLN
30.0000 mL | INTRAMUSCULAR | Status: DC | PRN
  Filled 2016-02-27: qty 30

## 2016-02-27 MED ORDER — LIDOCAINE HCL (PF) 1 % IJ SOLN
INTRAMUSCULAR | Status: DC | PRN
  Administered 2016-02-27 (×2): 4 mL

## 2016-02-27 MED ORDER — CEFAZOLIN SODIUM-DEXTROSE 2-4 GM/100ML-% IV SOLN
2.0000 g | Freq: Once | INTRAVENOUS | Status: AC
  Administered 2016-02-27: 2 g via INTRAVENOUS
  Filled 2016-02-27: qty 100

## 2016-02-27 MED ORDER — EPHEDRINE 5 MG/ML INJ
10.0000 mg | INTRAVENOUS | Status: DC | PRN
  Filled 2016-02-27: qty 4

## 2016-02-27 MED ORDER — OXYTOCIN BOLUS FROM INFUSION: 500.0000 mL | Freq: Once | INTRAVENOUS | Status: DC

## 2016-02-27 MED ORDER — TERBUTALINE SULFATE 1 MG/ML IJ SOLN
0.2500 mg | Freq: Once | INTRAMUSCULAR | Status: DC | PRN
  Filled 2016-02-27: qty 1

## 2016-02-27 MED ORDER — LACTATED RINGERS IV SOLN
INTRAVENOUS | Status: DC
  Administered 2016-02-27: 1000 mL via INTRAVENOUS
  Administered 2016-02-27: 125 mL/h via INTRAVENOUS

## 2016-02-27 MED ORDER — CEFAZOLIN IN D5W 1 GM/50ML IV SOLN
1.0000 g | Freq: Three times a day (TID) | INTRAVENOUS | Status: DC
  Administered 2016-02-27: 1 g via INTRAVENOUS
  Filled 2016-02-27 (×2): qty 50

## 2016-02-27 MED ORDER — ONDANSETRON HCL 4 MG/2ML IJ SOLN: 4.0000 mg | Freq: Four times a day (QID) | INTRAMUSCULAR | Status: DC | PRN

## 2016-02-27 MED ORDER — ACETAMINOPHEN 325 MG PO TABS
650.0000 mg | ORAL_TABLET | ORAL | Status: DC | PRN
  Administered 2016-02-28 (×2): 650 mg via ORAL
  Filled 2016-02-27 (×2): qty 2

## 2016-02-27 MED ORDER — FENTANYL 2.5 MCG/ML BUPIVACAINE 1/10 % EPIDURAL INFUSION (WH - ANES)
INTRAMUSCULAR | Status: DC | PRN
  Administered 2016-02-27: 14 mL/h via EPIDURAL

## 2016-02-27 MED ORDER — DIBUCAINE 1 % RE OINT: 1.0000 "application " | TOPICAL_OINTMENT | RECTAL | Status: DC | PRN

## 2016-02-27 MED ORDER — OXYTOCIN 40 UNITS IN LACTATED RINGERS INFUSION - SIMPLE MED: 2.5000 [IU]/h | INTRAVENOUS | Status: DC

## 2016-02-27 MED ORDER — DIPHENHYDRAMINE HCL 25 MG PO CAPS: 25.0000 mg | ORAL_CAPSULE | Freq: Four times a day (QID) | ORAL | Status: DC | PRN

## 2016-02-27 MED ORDER — OXYCODONE-ACETAMINOPHEN 5-325 MG PO TABS: 2.0000 | ORAL_TABLET | ORAL | Status: DC | PRN

## 2016-02-27 MED ORDER — FENTANYL 2.5 MCG/ML BUPIVACAINE 1/10 % EPIDURAL INFUSION (WH - ANES): 14.0000 mL/h | INTRAMUSCULAR | Status: DC | PRN

## 2016-02-27 MED ORDER — SOD CITRATE-CITRIC ACID 500-334 MG/5ML PO SOLN: 30.0000 mL | ORAL | Status: DC | PRN

## 2016-02-27 MED ORDER — SIMETHICONE 80 MG PO CHEW: 80.0000 mg | CHEWABLE_TABLET | ORAL | Status: DC | PRN

## 2016-02-27 MED ORDER — LACTATED RINGERS IV SOLN
500.0000 mL | INTRAVENOUS | Status: DC | PRN
  Administered 2016-02-27: 500 mL via INTRAVENOUS

## 2016-02-27 MED ORDER — OXYCODONE-ACETAMINOPHEN 5-325 MG PO TABS
1.0000 | ORAL_TABLET | ORAL | Status: DC | PRN
  Administered 2016-02-27: 1 via ORAL
  Filled 2016-02-27: qty 1

## 2016-02-27 MED ORDER — PHENYLEPHRINE 40 MCG/ML (10ML) SYRINGE FOR IV PUSH (FOR BLOOD PRESSURE SUPPORT)
PREFILLED_SYRINGE | INTRAVENOUS | Status: DC
Start: 2016-02-27 — End: 2016-02-27
  Filled 2016-02-27: qty 20

## 2016-02-27 MED ORDER — ONDANSETRON HCL 4 MG/2ML IJ SOLN: 4.0000 mg | INTRAMUSCULAR | Status: DC | PRN

## 2016-02-27 MED ORDER — IBUPROFEN 600 MG PO TABS
600.0000 mg | ORAL_TABLET | Freq: Four times a day (QID) | ORAL | Status: DC
  Administered 2016-02-28 – 2016-02-29 (×6): 600 mg via ORAL
  Filled 2016-02-27 (×6): qty 1

## 2016-02-27 MED ORDER — OXYTOCIN 40 UNITS IN LACTATED RINGERS INFUSION - SIMPLE MED
1.0000 m[IU]/min | INTRAVENOUS | Status: DC
  Administered 2016-02-27: 4 m[IU]/min via INTRAVENOUS
  Administered 2016-02-27: 2 m[IU]/min via INTRAVENOUS
  Filled 2016-02-27: qty 1000

## 2016-02-27 MED ORDER — LACTATED RINGERS IV SOLN: 500.0000 mL | Freq: Once | INTRAVENOUS | Status: DC

## 2016-02-27 MED ORDER — ZOLPIDEM TARTRATE 5 MG PO TABS: 5.0000 mg | ORAL_TABLET | Freq: Every evening | ORAL | Status: DC | PRN

## 2016-02-27 MED ORDER — PRENATAL MULTIVITAMIN CH
1.0000 | ORAL_TABLET | Freq: Every day | ORAL | Status: DC
  Administered 2016-02-28: 1 via ORAL
  Filled 2016-02-27: qty 1

## 2016-02-27 MED ORDER — PHENYLEPHRINE 40 MCG/ML (10ML) SYRINGE FOR IV PUSH (FOR BLOOD PRESSURE SUPPORT)
80.0000 ug | PREFILLED_SYRINGE | INTRAVENOUS | Status: DC | PRN
Start: 2016-02-27 — End: 2016-02-27
  Filled 2016-02-27: qty 5

## 2016-02-27 MED ORDER — BENZOCAINE-MENTHOL 20-0.5 % EX AERO
1.0000 "application " | INHALATION_SPRAY | CUTANEOUS | Status: DC | PRN
  Administered 2016-02-29: 1 via TOPICAL
  Filled 2016-02-27: qty 56

## 2016-02-27 MED ORDER — FENTANYL 2.5 MCG/ML BUPIVACAINE 1/10 % EPIDURAL INFUSION (WH - ANES)
14.0000 mL/h | INTRAMUSCULAR | Status: DC | PRN
  Administered 2016-02-27: 14 mL/h via EPIDURAL
  Filled 2016-02-27: qty 125

## 2016-02-27 MED ORDER — TETANUS-DIPHTH-ACELL PERTUSSIS 5-2.5-18.5 LF-MCG/0.5 IM SUSP: 0.5000 mL | Freq: Once | INTRAMUSCULAR | Status: DC

## 2016-02-27 MED ORDER — FENTANYL 2.5 MCG/ML BUPIVACAINE 1/10 % EPIDURAL INFUSION (WH - ANES)
INTRAMUSCULAR | Status: AC
  Administered 2016-02-27: 12:00:00
  Filled 2016-02-27: qty 125

## 2016-02-27 MED ORDER — WITCH HAZEL-GLYCERIN EX PADS: 1.0000 "application " | MEDICATED_PAD | CUTANEOUS | Status: DC | PRN

## 2016-02-27 NOTE — Anesthesia Preprocedure Evaluation (Signed)
Anesthesia Evaluation  Patient identified by MRN, date of birth, ID band Patient awake    Reviewed: Allergy & Precautions, H&P , Patient's Chart, lab work & pertinent test results  Airway Mallampati: II  TM Distance: >3 FB Neck ROM: full    Dental no notable dental hx.    Pulmonary neg pulmonary ROS,    Pulmonary exam normal breath sounds clear to auscultation       Cardiovascular negative cardio ROS   Rhythm:regular Rate:Normal     Neuro/Psych negative neurological ROS  negative psych ROS   GI/Hepatic Neg liver ROS, GERD  ,  Endo/Other  negative endocrine ROS  Renal/GU negative Renal ROS     Musculoskeletal   Abdominal   Peds  Hematology negative hematology ROS (+)   Anesthesia Other Findings   Reproductive/Obstetrics (+) Pregnancy                             Anesthesia Physical  Anesthesia Plan  ASA: II  Anesthesia Plan: Epidural   Post-op Pain Management:    Induction:   Airway Management Planned:   Additional Equipment:   Intra-op Plan:   Post-operative Plan:   Informed Consent: I have reviewed the patients History and Physical, chart, labs and discussed the procedure including the risks, benefits and alternatives for the proposed anesthesia with the patient or authorized representative who has indicated his/her understanding and acceptance.     Plan Discussed with:   Anesthesia Plan Comments:         Anesthesia Quick Evaluation

## 2016-02-27 NOTE — Progress Notes (Signed)
Victoria Schneider is a 35 y.o. [phone] at 1513w1d by ultrasound admitted for induction of labor due to Post dates. Due date 02/19/16.  Subjective:   Objective: BP 118/77   Pulse 78   Temp 97.9 F (36.6 C) (Oral)   Resp 15   Ht 5\' 5"  (1.651 m)   Wt 227 lb (103 kg)   LMP 05/15/2015   SpO2 100%   BMI 37.77 kg/m  No intake/output data recorded. No intake/output data recorded.  FHT:  FHR: 130's bpm, variability: moderate,  accelerations:  Present,  decelerations:  Absent UC:   regular, every 4-6 minutes SVE:   Dilation: 5 Effacement (%): 80 Station: -2 Exam by:: ashley cnm  Labs: Lab Results  Component Value Date   WBC 8.2 02/27/2016   HGB 9.1 (L) 02/27/2016   HCT 29.7 (L) 02/27/2016   MCV 71.1 (L) 02/27/2016   PLT 284 02/27/2016    Assessment / Plan: Induction of labor due to postterm,  progressing well on pitocin  Labor: Progressing normally Preeclampsia:  no signs or symptoms of toxicity, intake and ouput balanced and labs stable Fetal Wellbeing:  Category I Pain Control:  Epidural I/D:  n/a Anticipated MOD:  NSVD  Renetta ChalkAshley Ellis 02/27/2016, 3:24 PM

## 2016-02-27 NOTE — Anesthesia Procedure Notes (Signed)
Epidural Patient location during procedure: OB  Staffing Anesthesiologist: Lewie LoronGERMEROTH, Husna Krone Performed: anesthesiologist   Preanesthetic Checklist Completed: patient identified, pre-op evaluation, timeout performed, IV checked, risks and benefits discussed and monitors and equipment checked  Epidural Patient position: sitting Prep: site prepped and draped and DuraPrep Patient monitoring: heart rate Approach: midline Location: L2-L3 Injection technique: LOR air and LOR saline  Needle:  Needle type: Tuohy  Needle gauge: 17 G Needle length: 9 cm Catheter type: closed end flexible Catheter size: 19 Gauge Catheter at skin depth: 12 cm Test dose: negative  Assessment Sensory level: T8 Events: blood not aspirated, injection not painful, no injection resistance, negative IV test and no paresthesia  Additional Notes Reason for block:procedure for pain

## 2016-02-27 NOTE — H&P (Signed)
Victoria Schneider is a 35 y.o. female [phone] @41 .1 wks presenting for IOL. Denies any vag bleeding or ROM. GBS pos.  OB History    Gravida Para Term Preterm AB Living   4 3 3     3    SAB TAB Ectopic Multiple Live Births           3     Past Medical History:  Diagnosis Date  . GERD (gastroesophageal reflux disease)   . Medical history non-contributory    Past Surgical History:  Procedure Laterality Date  . NO PAST SURGERIES     Family History: family history includes Diabetes in her brother and father. Social History:  reports that she has never smoked. She has never used smokeless tobacco. She reports that she does not drink alcohol or use drugs.     Maternal Diabetes: No Genetic Screening: Normal Maternal Ultrasounds/Referrals: Normal Fetal Ultrasounds or other Referrals:  None Maternal Substance Abuse:  No Significant Maternal Medications:  None Significant Maternal Lab Results:  None Other Comments:  None  Review of Systems  Constitutional: Negative.   HENT: Negative.   Eyes: Negative.   Respiratory: Negative.   Cardiovascular: Negative.   Gastrointestinal: Positive for abdominal pain.  Genitourinary: Negative.   Musculoskeletal: Negative.   Skin: Negative.   Neurological: Negative.   Endo/Heme/Allergies: Negative.   Psychiatric/Behavioral: Negative.    Maternal Medical History:  Reason for admission: IOL postdates   Fetal activity: Perceived fetal activity is normal.   Last perceived fetal movement was within the past hour.    Prenatal complications: no prenatal complications Prenatal Complications - Diabetes: none.    Dilation: 2 Effacement (%): 50 Station: -3 Exam by:: Renetta Chalk sCNM Blood pressure 121/78, pulse 89, temperature 98.2 F (36.8 C), temperature source Oral, resp. rate 20, height 5\' 5"  (1.651 m), weight 227 lb (103 kg), last menstrual period 05/15/2015, unknown if currently breastfeeding. Maternal Exam:  Uterine Assessment:  Contraction strength is mild.  Contraction frequency is irregular.   Abdomen: Patient reports no abdominal tenderness. Estimated fetal weight is 7-5.   Fetal presentation: vertex  Introitus: Normal vulva. Normal vagina.  Ferning test: not done.  Nitrazine test: not done. Amniotic fluid character: not assessed.  Pelvis: adequate for delivery.   Cervix: Cervix evaluated by digital exam.     Fetal Exam Fetal Monitor Review: Mode: ultrasound.   Baseline rate: 140's.  Variability: moderate (6-25 bpm).   Pattern: accelerations present and no decelerations.    Fetal State Assessment: Category I - tracings are normal.     Physical Exam  Constitutional: She is oriented to person, place, and time. She appears well-developed and well-nourished.  HENT:  Head: Normocephalic.  Eyes: Pupils are equal, round, and reactive to light.  Neck: Normal range of motion.  Cardiovascular: Normal rate and regular rhythm.   Respiratory: Effort normal and breath sounds normal.  GI: Soft. Bowel sounds are normal.  Genitourinary: Vagina normal and uterus normal.  Musculoskeletal: Normal range of motion.  Neurological: She is alert and oriented to person, place, and time. She has normal reflexes.  Skin: Skin is warm and dry.  Psychiatric: She has a normal mood and affect. Her behavior is normal. Judgment and thought content normal.    Prenatal labs: ABO, Rh: --/--/O POS (09/10 0800) Antibody: NEG (09/10 0800) Rubella:   RPR:    HBsAg:    HIV:    GBS: Positive (08/25 0000)   Assessment/Plan: Preg. @ 41.1 wks, [id number], presents for  IOL for postdates. SVE 2/50/-3 (post). GBS +. Will admit to L&D. Will start antibiotic therapy, foley bulb insertion and  oxytocin.    Renetta Chalkshley Ellis 02/27/2016, 9:26 AM

## 2016-02-27 NOTE — Anesthesia Pain Management Evaluation Note (Signed)
  CRNA Pain Management Visit Note  Patient: Victoria Schneider, 35 y.o., female  "Hello [facility] am a [facility]. We have an anesthesia team available at all times to provide care throughout the hospital, including epidural management and anesthesia for C-section. I don't know your plan for the delivery whether it a natural birth, water birth, IV sedation, nitrous supplementation, doula or epidural, but we want to meet your pain goals."   1.Was your pain managed to your expectations on prior hospitalizations?   No prior hospitalizations  2.What is your expectation for pain management during this hospitalization?     Epidural  3.How can we help you reach that goal? Support as needed.  Record the patient's initial score and the patient's pain goal.   Pain: 1  Pain Goal: 3 The [facility] wants you to be able to say your pain was always managed very well.  [facility] MonroeWRINKLE,Victoria Schneider 02/27/2016

## 2016-02-28 ENCOUNTER — Encounter (HOSPITAL_COMMUNITY): Payer: Self-pay

## 2016-02-28 LAB — RPR: RPR: NONREACTIVE

## 2016-02-28 MED ORDER — OXYCODONE-ACETAMINOPHEN 5-325 MG PO TABS
1.0000 | ORAL_TABLET | ORAL | Status: DC | PRN
  Administered 2016-02-28: 1 via ORAL
  Administered 2016-02-28 – 2016-02-29 (×5): 2 via ORAL
  Filled 2016-02-28 (×6): qty 2

## 2016-02-28 NOTE — Anesthesia Postprocedure Evaluation (Signed)
Anesthesia Post Note  Patient: Oletta DarterMaria G [facility]  Procedure(s) Performed: * No procedures listed *  Patient location during evaluation: Mother Baby Anesthesia Type: Epidural Level of consciousness: awake Pain management: satisfactory to patient Vital Signs Assessment: post-procedure vital signs reviewed and stable Respiratory status: spontaneous breathing Cardiovascular status: stable Anesthetic complications: no     Last Vitals:  Vitals:   02/28/16 0040 02/28/16 0503  BP: 133/76 119/67  Pulse: 78 73  Resp: 18 18  Temp: 37.5 C 37 C    Last Pain:  Vitals:   02/28/16 0758  TempSrc:   PainSc: 8    Pain Goal: Patients Stated Pain Goal: 5 (02/28/16 0503)               Cephus ShellingBURGER,Dekker Verga

## 2016-02-28 NOTE — Progress Notes (Signed)
POSTPARTUM PROGRESS NOTE  Post Partum Day 1  Subjective:  Victoria Schneider is a 35 y.o. [phone] 8034w1d s/p NSVD.  No acute events overnight.  Pt denies problems with ambulating, voiding or po intake.  She denies nausea or vomiting.  Pain is well controlled. Lochia Moderate.   Objective: Blood pressure 119/67, pulse 73, temperature 98.6 F (37 C), temperature source Oral, resp. rate 18, height 5\' 5"  (1.651 m), weight 227 lb (103 kg), last menstrual period 05/15/2015, SpO2 100 %, unknown if currently breastfeeding.  Physical Exam:  General: alert, cooperative and no distress Lochia:normal flow Chest: no respiratory distress Heart:regular rate, distal pulses intact Abdomen: soft, nontender,  Uterine Fundus: firm, appropriately tender DVT Evaluation: No calf swelling or tenderness Extremities: no edema   Recent Labs  02/27/16 0800  HGB 9.1*  HCT 29.7*    Assessment/Plan:  ASSESSMENT: Victoria Schneider is a 35 y.o. [id number] 534w1d s/p NSVD last night. Doing well. Breastfeeding.  Plan for discharge tomorrow   LOS: 1 day   Kandra NicolasJulie P DegeleMD 02/28/2016, 7:44 AM

## 2016-02-28 NOTE — Progress Notes (Signed)
UR chart review completed.  

## 2016-02-29 DIAGNOSIS — IMO0001 Reserved for inherently not codable concepts without codable children: Secondary | ICD-10-CM

## 2016-02-29 DIAGNOSIS — Z3A41 41 weeks gestation of pregnancy: Secondary | ICD-10-CM

## 2016-02-29 DIAGNOSIS — O9962 Diseases of the digestive system complicating childbirth: Secondary | ICD-10-CM

## 2016-02-29 DIAGNOSIS — K219 Gastro-esophageal reflux disease without esophagitis: Secondary | ICD-10-CM

## 2016-02-29 DIAGNOSIS — O99824 Streptococcus B carrier state complicating childbirth: Secondary | ICD-10-CM

## 2016-02-29 DIAGNOSIS — O480 Post-term pregnancy: Secondary | ICD-10-CM

## 2016-02-29 MED ORDER — IBUPROFEN 600 MG PO TABS: 600.0000 mg | ORAL_TABLET | Freq: Four times a day (QID) | ORAL | 0 refills | Status: DC

## 2016-02-29 NOTE — Discharge Summary (Signed)
OB Discharge Summary     Patient Name: Victoria Schneider DOB: [birth year] MRN: [id number]  Date of admission: 02/27/2016 Delivering MD: Victoria Schneider   Date of discharge: 02/29/2016  Admitting diagnosis: INDUCTION Intrauterine pregnancy: [redacted]w[redacted]d     Secondary diagnosis:  Active Problems:   Post-dates pregnancy   Status post vaginal delivery  Additional problems: none     Discharge diagnosis: Term Pregnancy Delivered                                                                                                Post partum procedures:none  Augmentation: Pitocin and Cytotec  Complications: None  Hospital course:  Induction of Labor With Vaginal Delivery   35 y.o. yo [id number] at [redacted]w[redacted]d was admitted to the hospital 02/27/2016 for induction of labor.  Indication for induction: Postdates.  Patient had an uncomplicated labor course as follows: Membrane Rupture Time/Date: 10:50 AM ,02/27/2016   Intrapartum Procedures: Episiotomy: None [1]                                         Lacerations:  1st degree [2];Perineal [11]  Patient had delivery of a Viable infant.  Information for the patient's newborn:  Victoria Schneider [[id number]]  Delivery Method: Vag-Spont   02/27/2016  Details of delivery can be found in separate delivery note.  Patient had a routine postpartum course. Patient is discharged home 02/29/16.   Physical exam Vitals:   02/28/16 0040 02/28/16 0503 02/28/16 1800 02/29/16 0500  BP: 133/76 119/67 112/64 122/62  Pulse: 78 73 78 80  Resp: 18 18 18 18   Temp: 99.5 F (37.5 C) 98.6 F (37 C) 98 F (36.7 C) 97.8 F (36.6 C)  TempSrc: Oral Oral Oral Oral  SpO2:      Weight:      Height:       General: alert, cooperative and no distress Lochia: appropriate Uterine Fundus: firm Incision: N/A DVT Evaluation: No evidence of DVT seen on physical exam. Negative Homan's sign. No cords or calf tenderness. No significant calf/ankle edema. Labs: Lab Results   Component Value Date   WBC 8.2 02/27/2016   HGB 9.1 (L) 02/27/2016   HCT 29.7 (L) 02/27/2016   MCV 71.1 (L) 02/27/2016   PLT 284 02/27/2016   CMP Latest Ref Rng & Units 08/26/2012  Glucose 70 - 99 mg/dL 76  BUN 6 - 23 mg/dL 4(L)  Creatinine 6.57 - 1.10 mg/dL 8.46(N)  Sodium 629 - 528 mEq/L 132(L)  Potassium 3.5 - 5.1 mEq/L 3.7  Chloride 96 - 112 mEq/L 101  CO2 19 - 32 mEq/L 21  Calcium 8.4 - 10.5 mg/dL 9.2  Total Protein 6.0 - 8.3 g/dL 7.3  Total Bilirubin 0.3 - 1.2 mg/dL 0.5  Alkaline Phos 39 - 117 U/L 198(H)  AST 0 - 37 U/L 31  ALT 0 - 35 U/L 10    Discharge instruction: per After Visit Summary and "Baby and Me Booklet".  After visit meds:  Medication List    TAKE these medications   acetaminophen 500 MG tablet Commonly known as:  TYLENOL Take 1,000 mg by mouth every 6 (six) hours as needed for mild pain, moderate pain or headache.   ibuprofen 600 MG tablet Commonly known as:  ADVIL,MOTRIN Take 1 tablet (600 mg total) by mouth every 6 (six) hours.   lansoprazole 30 MG capsule Commonly known as:  PREVACID Take 30 mg by mouth daily as needed (for indigestion/heartburn).   prenatal multivitamin Tabs tablet Take 1 tablet by mouth at bedtime.       Diet: routine diet  Activity: Advance as tolerated. Pelvic rest for 6 weeks.   Outpatient follow up:6 weeks Follow up Appt:No future appointments. Follow up Visit:No Follow-up on file.  Postpartum contraception: Progesterone only pills  Newborn Data: Live born female  Birth Weight: 7 lb 13.8 oz (3566 g) APGAR: 9, 9  Baby Feeding: Breast Disposition:home with mother   02/29/2016 Victoria PearJulie P Degele, MD    Midwife attestation I have seen and examined this patient and agree with above documentation in the resident's note.   Victoria OysterMaria G Schneider Schneider is a 35 y.o. [id number] s/p SVD. Pain is well controlled.  Plan for birth control is oral progesterone-only contraceptive.  Method of Feeding: breast  PE:  BP 122/62    Pulse 80   Temp 97.8 F (36.6 C) (Oral)   Resp 18   Ht 5\' 5"  (1.651 m)   Wt 103 kg (227 lb)   LMP 05/15/2015   SpO2 100%   Breastfeeding? Unknown   BMI 37.77 kg/m  Gen: well appearing Heart: reg rate Lungs: normal WOB Fundus firm Ext: soft, no pain, no edema  No results for input(s): HGB, HCT in the last 72 hours.   Plan: discharge today - postpartum care discussed - f/u clinic in 6 weeks for postpartum visit   Victoria LarryMelanie Zavior Schneider, CNM 10:58 AM

## 2016-02-29 NOTE — Discharge Instructions (Signed)
Breastfeeding Deciding to breastfeed is one of the best choices you can make for you and your baby. A change in hormones during pregnancy causes your breast tissue to grow and increases the number and size of your milk ducts. These hormones also allow proteins, sugars, and fats from your blood supply to make breast milk in your milk-producing glands. Hormones prevent breast milk from being released before your baby is born as well as prompt milk flow after birth. Once breastfeeding has begun, thoughts of your baby, as well as his or her sucking or crying, can stimulate the release of milk from your milk-producing glands.  BENEFITS OF BREASTFEEDING For Your Baby  Your first milk (colostrum) helps your baby's digestive system function better.  There are antibodies in your milk that help your baby fight off infections.  Your baby has a lower incidence of asthma, allergies, and sudden infant death syndrome.  The nutrients in breast milk are better for your baby than infant formulas and are designed uniquely for your baby's needs.  Breast milk improves your baby's brain development.  Your baby is less likely to develop other conditions, such as childhood obesity, asthma, or type 2 diabetes mellitus. For You  Breastfeeding helps to create a very special bond between you and your baby.  Breastfeeding is convenient. Breast milk is always available at the correct temperature and costs nothing.  Breastfeeding helps to burn calories and helps you lose the weight gained during pregnancy.  Breastfeeding makes your uterus contract to its prepregnancy size faster and slows bleeding (lochia) after you give birth.   Breastfeeding helps to lower your risk of developing type 2 diabetes mellitus, osteoporosis, and breast or ovarian cancer later in life. SIGNS THAT YOUR BABY IS HUNGRY Early Signs of Hunger  Increased alertness or activity.  Stretching.  Movement of the head from side to  side.  Movement of the head and opening of the mouth when the corner of the mouth or cheek is stroked (rooting).  Increased sucking sounds, smacking lips, cooing, sighing, or squeaking.  Hand-to-mouth movements.  Increased sucking of fingers or hands. Late Signs of Hunger  Fussing.  Intermittent crying. Extreme Signs of Hunger Signs of extreme hunger will require calming and consoling before your baby will be able to breastfeed successfully. Do not wait for the following signs of extreme hunger to occur before you initiate breastfeeding:  Restlessness.  A loud, strong cry.  Screaming. BREASTFEEDING BASICS Breastfeeding Initiation  Find a comfortable place to sit or lie down, with your neck and back well supported.  Place a pillow or rolled up blanket under your baby to bring him or her to the level of your breast (if you are seated). Nursing pillows are specially designed to help support your arms and your baby while you breastfeed.  Make sure that your baby's abdomen is facing your abdomen.  Gently massage your breast. With your fingertips, massage from your chest wall toward your nipple in a circular motion. This encourages milk flow. You may need to continue this action during the feeding if your milk flows slowly.  Support your breast with 4 fingers underneath and your thumb above your nipple. Make sure your fingers are well away from your nipple and your baby's mouth.  Stroke your baby's lips gently with your finger or nipple.  When your baby's mouth is open wide enough, quickly bring your baby to your breast, placing your entire nipple and as much of the colored area around your nipple (  areola) as possible into your baby's mouth.  More areola should be visible above your baby's upper lip than below the lower lip.  Your baby's tongue should be between his or her lower gum and your breast.  Ensure that your baby's mouth is correctly positioned around your nipple  (latched). Your baby's lips should create a seal on your breast and be turned out (everted).  It is common for your baby to suck about 2-3 minutes in order to start the flow of breast milk. Latching Teaching your baby how to latch on to your breast properly is very important. An improper latch can cause nipple pain and decreased milk supply for you and poor weight gain in your baby. Also, if your baby is not latched onto your nipple properly, he or she may swallow some air during feeding. This can make your baby fussy. Burping your baby when you switch breasts during the feeding can help to get rid of the air. However, teaching your baby to latch on properly is still the best way to prevent fussiness from swallowing air while breastfeeding. Signs that your baby has successfully latched on to your nipple:  Silent tugging or silent sucking, without causing you pain.  Swallowing heard between every 3-4 sucks.  Muscle movement above and in front of his or her ears while sucking. Signs that your baby has not successfully latched on to nipple:  Sucking sounds or smacking sounds from your baby while breastfeeding.  Nipple pain. If you think your baby has not latched on correctly, slip your finger into the corner of your baby's mouth to break the suction and place it between your baby's gums. Attempt breastfeeding initiation again. Signs of Successful Breastfeeding Signs from your baby:  A gradual decrease in the number of sucks or complete cessation of sucking.  Falling asleep.  Relaxation of his or her body.  Retention of a small amount of milk in his or her mouth.  Letting go of your breast by himself or herself. Signs from you:  Breasts that have increased in firmness, weight, and size 1-3 hours after feeding.  Breasts that are softer immediately after breastfeeding.  Increased milk volume, as well as a change in milk consistency and color by the fifth day of breastfeeding.  Nipples  that are not sore, cracked, or bleeding. Signs That Your Baby is Getting Enough Milk  Wetting at least 3 diapers in a 24-hour period. The urine should be clear and pale yellow by age 5 days.  At least 3 stools in a 24-hour period by age 5 days. The stool should be soft and yellow.  At least 3 stools in a 24-hour period by age 7 days. The stool should be seedy and yellow.  No loss of weight greater than 10% of birth weight during the first 3 days of age.  Average weight gain of 4-7 ounces (113-198 g) per week after age 4 days.  Consistent daily weight gain by age 5 days, without weight loss after the age of 2 weeks. After a feeding, your baby may spit up a small amount. This is common. BREASTFEEDING FREQUENCY AND DURATION Frequent feeding will help you make more milk and can prevent sore nipples and breast engorgement. Breastfeed when you feel the need to reduce the fullness of your breasts or when your baby shows signs of hunger. This is called "breastfeeding on demand." Avoid introducing a pacifier to your baby while you are working to establish breastfeeding (the first 4-6 weeks   after your baby is born). After this time you may choose to use a pacifier. Research has shown that pacifier use during the first year of a baby's life decreases the risk of sudden infant death syndrome (SIDS). Allow your baby to feed on each breast as long as he or she wants. Breastfeed until your baby is finished feeding. When your baby unlatches or falls asleep while feeding from the first breast, offer the second breast. Because newborns are often sleepy in the first few weeks of life, you may need to awaken your baby to get him or her to feed. Breastfeeding times will vary from baby to baby. However, the following rules can serve as a guide to help you ensure that your baby is properly fed:  Newborns (babies 4 weeks of age or younger) may breastfeed every 1-3 hours.  Newborns should not go longer than 3 hours  during the day or 5 hours during the night without breastfeeding.  You should breastfeed your baby a minimum of 8 times in a 24-hour period until you begin to introduce solid foods to your baby at around 6 months of age. BREAST MILK PUMPING Pumping and storing breast milk allows you to ensure that your baby is exclusively fed your breast milk, even at times when you are unable to breastfeed. This is especially important if you are going back to work while you are still breastfeeding or when you are not able to be present during feedings. Your lactation consultant can give you guidelines on how long it is safe to store breast milk. A breast pump is a machine that allows you to pump milk from your breast into a sterile bottle. The pumped breast milk can then be stored in a refrigerator or freezer. Some breast pumps are operated by hand, while others use electricity. Ask your lactation consultant which type will work best for you. Breast pumps can be purchased, but some hospitals and breastfeeding support groups lease breast pumps on a monthly basis. A lactation consultant can teach you how to hand express breast milk, if you prefer not to use a pump. CARING FOR YOUR BREASTS WHILE YOU BREASTFEED Nipples can become dry, cracked, and sore while breastfeeding. The following recommendations can help keep your breasts moisturized and healthy:  Avoid using soap on your nipples.  Wear a supportive bra. Although not required, special nursing bras and tank tops are designed to allow access to your breasts for breastfeeding without taking off your entire bra or top. Avoid wearing underwire-style bras or extremely tight bras.  Air dry your nipples for 3-4minutes after each feeding.  Use only cotton bra pads to absorb leaked breast milk. Leaking of breast milk between feedings is normal.  Use lanolin on your nipples after breastfeeding. Lanolin helps to maintain your skin's normal moisture barrier. If you use  pure lanolin, you do not need to wash it off before feeding your baby again. Pure lanolin is not toxic to your baby. You may also hand express a few drops of breast milk and gently massage that milk into your nipples and allow the milk to air dry. In the first few weeks after giving birth, some women experience extremely full breasts (engorgement). Engorgement can make your breasts feel heavy, warm, and tender to the touch. Engorgement peaks within 3-5 days after you give birth. The following recommendations can help ease engorgement:  Completely empty your breasts while breastfeeding or pumping. You may want to start by applying warm, moist heat (in   the shower or with warm water-soaked hand towels) just before feeding or pumping. This increases circulation and helps the milk flow. If your baby does not completely empty your breasts while breastfeeding, pump any extra milk after he or she is finished.  Wear a snug bra (nursing or regular) or tank top for 1-2 days to signal your body to slightly decrease milk production.  Apply ice packs to your breasts, unless this is too uncomfortable for you.  Make sure that your baby is latched on and positioned properly while breastfeeding. If engorgement persists after 48 hours of following these recommendations, contact your health care provider or a lactation consultant. OVERALL HEALTH CARE RECOMMENDATIONS WHILE BREASTFEEDING  Eat healthy foods. Alternate between meals and snacks, eating 3 of each per day. Because what you eat affects your breast milk, some of the foods may make your baby more irritable than usual. Avoid eating these foods if you are sure that they are negatively affecting your baby.  Drink milk, fruit juice, and water to satisfy your thirst (about 10 glasses a day).  Rest often, relax, and continue to take your prenatal vitamins to prevent fatigue, stress, and anemia.  Continue breast self-awareness checks.  Avoid chewing and smoking  tobacco. Chemicals from cigarettes that pass into breast milk and exposure to secondhand smoke may harm your baby.  Avoid alcohol and drug use, including marijuana. Some medicines that may be harmful to your baby can pass through breast milk. It is important to ask your health care provider before taking any medicine, including all over-the-counter and prescription medicine as well as vitamin and herbal supplements. It is possible to become pregnant while breastfeeding. If birth control is desired, ask your health care provider about options that will be safe for your baby. SEEK MEDICAL CARE IF:  You feel like you want to stop breastfeeding or have become frustrated with breastfeeding.  You have painful breasts or nipples.  Your nipples are cracked or bleeding.  Your breasts are red, tender, or warm.  You have a swollen area on either breast.  You have a fever or chills.  You have nausea or vomiting.  You have drainage other than breast milk from your nipples.  Your breasts do not become full before feedings by the fifth day after you give birth.  You feel sad and depressed.  Your baby is too sleepy to eat well.  Your baby is having trouble sleeping.   Your baby is wetting less than 3 diapers in a 24-hour period.  Your baby has less than 3 stools in a 24-hour period.  Your baby's skin or the white part of his or her eyes becomes yellow.   Your baby is not gaining weight by 5 days of age. SEEK IMMEDIATE MEDICAL CARE IF:  Your baby is overly tired (lethargic) and does not want to wake up and feed.  Your baby develops an unexplained fever.   This information is not intended to replace advice given to you by your health care provider. Make sure you discuss any questions you have with your health care provider.   Document Released: 06/05/2005 Document Revised: 02/24/2015 Document Reviewed: 11/27/2012 [employer] Interactive Patient Education 2016 [employer]  

## 2016-08-08 ENCOUNTER — Emergency Department (HOSPITAL_COMMUNITY)
Admission: EM | Admit: 2016-08-08 | Discharge: 2016-08-09 | Disposition: A | Discharge status: Home / Self Care | Payer: Medicaid Other | Attending: Emergency Medicine | Admitting: Emergency Medicine

## 2016-08-08 ENCOUNTER — Encounter (HOSPITAL_COMMUNITY): Payer: Self-pay | Admitting: *Deleted

## 2016-08-08 ENCOUNTER — Emergency Department (HOSPITAL_COMMUNITY): Payer: Medicaid Other

## 2016-08-08 DIAGNOSIS — R931 Abnormal findings on diagnostic imaging of heart and coronary circulation: Secondary | ICD-10-CM | POA: Diagnosis not present

## 2016-08-08 DIAGNOSIS — Y999 Unspecified external cause status: Secondary | ICD-10-CM | POA: Diagnosis not present

## 2016-08-08 DIAGNOSIS — Y92410 Unspecified street and highway as the place of occurrence of the external cause: Secondary | ICD-10-CM | POA: Insufficient documentation

## 2016-08-08 DIAGNOSIS — R930 Abnormal findings on diagnostic imaging of skull and head, not elsewhere classified: Secondary | ICD-10-CM | POA: Diagnosis not present

## 2016-08-08 DIAGNOSIS — Z5181 Encounter for therapeutic drug level monitoring: Secondary | ICD-10-CM | POA: Insufficient documentation

## 2016-08-08 DIAGNOSIS — S93401A Sprain of unspecified ligament of right ankle, initial encounter: Secondary | ICD-10-CM

## 2016-08-08 DIAGNOSIS — S99911A Unspecified injury of right ankle, initial encounter: Secondary | ICD-10-CM | POA: Diagnosis present

## 2016-08-08 DIAGNOSIS — R935 Abnormal findings on diagnostic imaging of other abdominal regions, including retroperitoneum: Secondary | ICD-10-CM | POA: Diagnosis not present

## 2016-08-08 DIAGNOSIS — Y939 Activity, unspecified: Secondary | ICD-10-CM | POA: Insufficient documentation

## 2016-08-08 LAB — COMPREHENSIVE METABOLIC PANEL
ALT: 107 U/L — ABNORMAL HIGH (ref 14–54)
AST: 81 U/L — ABNORMAL HIGH (ref 15–41)
Albumin: 3.6 g/dL (ref 3.5–5.0)
Alkaline Phosphatase: 140 U/L — ABNORMAL HIGH (ref 38–126)
Anion gap: 8 (ref 5–15)
BUN: 8 mg/dL (ref 6–20)
CALCIUM: 9 mg/dL (ref 8.9–10.3)
CO2: 22 mmol/L (ref 22–32)
CREATININE: 0.6 mg/dL (ref 0.44–1.00)
Chloride: 106 mmol/L (ref 101–111)
GFR calc non Af Amer: >60 mL/min (ref >60)
GLUCOSE: 130 mg/dL — AB (ref 65–99)
Potassium: 3.7 mmol/L (ref 3.5–5.1)
SODIUM: 136 mmol/L (ref 135–145)
Total Bilirubin: 0.6 mg/dL (ref 0.3–1.2)
Total Protein: 7.1 g/dL (ref 6.5–8.1)

## 2016-08-08 LAB — I-STAT CHEM 8, ED
BUN: 7 mg/dL (ref 6–20)
CHLORIDE: 106 mmol/L (ref 101–111)
Calcium, Ion: 1.14 mmol/L — ABNORMAL LOW (ref 1.15–1.40)
Creatinine, Ser: 0.5 mg/dL (ref 0.44–1.00)
GLUCOSE: 135 mg/dL — AB (ref 65–99)
HCT: 36 % (ref 36.0–46.0)
Hemoglobin: 12.2 g/dL (ref 12.0–15.0)
POTASSIUM: 3.7 mmol/L (ref 3.5–5.1)
Sodium: 141 mmol/L (ref 135–145)
TCO2: 24 mmol/L (ref 0–100)

## 2016-08-08 LAB — CBC
HCT: 37.2 % (ref 36.0–46.0)
Hemoglobin: 11.5 g/dL — ABNORMAL LOW (ref 12.0–15.0)
MCH: 24.4 pg — AB (ref 26.0–34.0)
MCHC: 30.9 g/dL (ref 30.0–36.0)
MCV: 78.8 fL (ref 78.0–100.0)
PLATELETS: 268 10*3/uL (ref 150–400)
RBC: 4.72 MIL/uL (ref 3.87–5.11)
RDW: 15.4 % (ref 11.5–15.5)
WBC: 8.9 10*3/uL (ref 4.0–10.5)

## 2016-08-08 LAB — SAMPLE TO BLOOD BANK

## 2016-08-08 LAB — PROTIME-INR
INR: 1.02
Prothrombin Time: 13.4 seconds (ref 11.4–15.2)

## 2016-08-08 LAB — CDS SEROLOGY

## 2016-08-08 LAB — I-STAT BETA HCG BLOOD, ED (MC, WL, AP ONLY): I-stat hCG, quantitative: <5 m[IU]/mL (ref <5)

## 2016-08-08 LAB — I-STAT CG4 LACTIC ACID, ED: Lactic Acid, Venous: 1.51 mmol/L (ref 0.5–1.9)

## 2016-08-08 LAB — ETHANOL

## 2016-08-08 MED ORDER — ONDANSETRON HCL 4 MG/2ML IJ SOLN
4.0000 mg | Freq: Once | INTRAMUSCULAR | Status: AC
  Administered 2016-08-08: 4 mg via INTRAVENOUS
  Filled 2016-08-08: qty 2

## 2016-08-08 MED ORDER — MORPHINE SULFATE (PF) 4 MG/ML IV SOLN
4.0000 mg | Freq: Once | INTRAVENOUS | Status: AC
  Administered 2016-08-08: 4 mg via INTRAVENOUS
  Filled 2016-08-08: qty 1

## 2016-08-08 MED ORDER — IOPAMIDOL (ISOVUE-300) INJECTION 61%
INTRAVENOUS | Status: AC
  Administered 2016-08-09: 100 mL
  Filled 2016-08-08: qty 100
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
## 2016-08-08 NOTE — ED Notes (Signed)
Asked pt for urine specimen. Pt stated she could not give it at this time. Pt stated that she was not pregnant.

## 2016-08-08 NOTE — ED Notes (Signed)
Pt requesting pain med.  MD made aware

## 2016-08-09 ENCOUNTER — Encounter (HOSPITAL_COMMUNITY): Payer: Self-pay | Admitting: Radiology

## 2016-08-09 ENCOUNTER — Emergency Department (HOSPITAL_COMMUNITY): Payer: Medicaid Other

## 2016-08-09 MED ORDER — NAPROXEN 500 MG PO TABS: 500.0000 mg | ORAL_TABLET | Freq: Two times a day (BID) | ORAL | 0 refills | Status: DC

## 2016-08-09 MED ORDER — IBUPROFEN 600 MG PO TABS: 600.0000 mg | ORAL_TABLET | Freq: Four times a day (QID) | ORAL | 0 refills | Status: AC | PRN

## 2016-08-09 MED ORDER — OXYCODONE-ACETAMINOPHEN 5-325 MG PO TABS
1.0000 | ORAL_TABLET | Freq: Once | ORAL | Status: AC
  Administered 2016-08-09: 1 via ORAL
  Filled 2016-08-09: qty 1

## 2016-08-09 MED ORDER — CYCLOBENZAPRINE HCL 10 MG PO TABS: 10.0000 mg | ORAL_TABLET | Freq: Two times a day (BID) | ORAL | 0 refills | Status: DC | PRN

## 2016-08-09 NOTE — Discharge Instructions (Signed)
You will be very sore several days.  Follow-up with your PCP.  If you have persistent ankle pain, follow-up with the orthopedist.

## 2016-08-09 NOTE — ED Notes (Signed)
Attempted to walk pt, pt unable to bear weight on R foot, MD notified, pt offered food and drink states she does not want anything and refuses.

## 2016-08-09 NOTE — ED Provider Notes (Signed)
Patient signed out pending imaging. Imaging is all reassuring. She reports persistent right foot and ankle pain. She has difficulty bearing weight. Thompson test is negative. She appears to have intact flexion and extension at the ankle. She does have some midfoot tenderness. Plain films of the foot were added. These are negative. No evidence of Lisfranc injury. Suspect sprain. She provided crutches. Rest, ice, compression, and elevation recommended. Patient was given naproxen and Flexeril at discharge.  After history, exam, and medical workup I feel the patient has been appropriately medically screened and is safe for discharge home. Pertinent diagnoses were discussed with the patient. Patient was given return precautions.    Shon Batonourtney F Cambryn Charters, MD 08/09/16 [phone]

## 2016-08-27 ENCOUNTER — Ambulatory Visit (HOSPITAL_COMMUNITY)
Admission: RE | Admit: 2016-08-27 | Discharge: 2016-08-27 | Disposition: A | Discharge status: Home / Self Care | Payer: Medicaid Other | Source: Ambulatory Visit | Attending: Chiropractic Medicine | Admitting: Chiropractic Medicine

## 2016-08-27 ENCOUNTER — Other Ambulatory Visit (HOSPITAL_COMMUNITY): Payer: Self-pay | Admitting: Chiropractic Medicine

## 2016-08-27 DIAGNOSIS — M549 Dorsalgia, unspecified: Secondary | ICD-10-CM | POA: Insufficient documentation

## 2016-08-27 DIAGNOSIS — M47892 Other spondylosis, cervical region: Secondary | ICD-10-CM | POA: Diagnosis not present

## 2016-08-27 DIAGNOSIS — M5136 Other intervertebral disc degeneration, lumbar region: Secondary | ICD-10-CM | POA: Diagnosis not present

## 2016-08-27 DIAGNOSIS — M4306 Spondylolysis, lumbar region: Secondary | ICD-10-CM | POA: Insufficient documentation

## 2016-08-27 DIAGNOSIS — M419 Scoliosis, unspecified: Secondary | ICD-10-CM | POA: Insufficient documentation

## 2016-11-12 ENCOUNTER — Emergency Department (HOSPITAL_COMMUNITY): Payer: Medicaid Other

## 2016-11-12 ENCOUNTER — Encounter (HOSPITAL_COMMUNITY): Payer: Self-pay | Admitting: Nurse Practitioner

## 2016-11-12 ENCOUNTER — Emergency Department (HOSPITAL_COMMUNITY)
Admission: EM | Admit: 2016-11-12 | Discharge: 2016-11-12 | Disposition: A | Discharge status: Home / Self Care | Payer: Medicaid Other | Attending: Emergency Medicine | Admitting: Emergency Medicine

## 2016-11-12 DIAGNOSIS — Z79899 Other long term (current) drug therapy: Secondary | ICD-10-CM | POA: Insufficient documentation

## 2016-11-12 DIAGNOSIS — R059 Cough, unspecified: Secondary | ICD-10-CM

## 2016-11-12 DIAGNOSIS — R05 Cough: Secondary | ICD-10-CM | POA: Insufficient documentation

## 2016-11-12 MED ORDER — BENZONATATE 100 MG PO CAPS: 100.0000 mg | ORAL_CAPSULE | Freq: Three times a day (TID) | ORAL | 0 refills | Status: DC | PRN

## 2016-11-12 MED ORDER — PREDNISONE 20 MG PO TABS
60.0000 mg | ORAL_TABLET | Freq: Once | ORAL | Status: AC
  Administered 2016-11-12: 60 mg via ORAL
  Filled 2016-11-12: qty 3

## 2016-11-12 MED ORDER — PREDNISONE 10 MG PO TABS: 40.0000 mg | ORAL_TABLET | Freq: Every day | ORAL | 0 refills | Status: AC

## 2016-11-12 MED ORDER — ALBUTEROL SULFATE HFA 108 (90 BASE) MCG/ACT IN AERS
2.0000 | INHALATION_SPRAY | RESPIRATORY_TRACT | Status: DC
  Administered 2016-11-12: 2 via RESPIRATORY_TRACT
  Filled 2016-11-12: qty 6.7

## 2016-11-12 NOTE — Discharge Instructions (Signed)
Please take albuterol as needed. Please take prednisone every day for 5 days. Take with food. Use Tessalon Perles as needed for cough. Please follow up with a primary care provider regarding today's visit.  Get help right away if: You cough up blood. You have difficulty breathing. Your heartbeat is very fast.

## 2016-11-12 NOTE — ED Triage Notes (Signed)
Pt states she is having a hacking unproductive cough that worsens when she lays down at night. Denies fever or chills.

## 2016-11-12 NOTE — ED Provider Notes (Signed)
[facility]-EMERGENCY DEPT Provider Note   CSN: [id number] Arrival date & time: 11/12/16  2110  By signing my name below, I, Elsie Stain, attest that this documentation has been prepared under the direction and in the presence of non-physician provider, Candie Mile, [place]. Electronically Signed: Elsie Stain, ED Scribe. 11/12/2016. 9:49 PM.   History   Chief Complaint Chief Complaint  Patient presents with  . Cough   HPI Victoria Schneider is a 36 y.o. female who presents to the Emergency Department complaining of a persistent, gradually worsening nonproductive cough onset one month ago. Per pt, coughing is worse at night and is exacerbated when lying flat. Pt also reports associated postussive emesis, back pain and chest pain secondary to cough. Pt has tried to use inhaler with moderate improvement.Pt had bronchitis five months ago and believes her symptoms are similar.  Pt does not believe her symptoms are caused by allergies. Pt does not smoke. No personal history of COPD and asthma. Denies fevers, chills, hematemesis, production of sputum, nasal congestion, sore throat, SOB, n/d, postnasal drip.   The history is provided by the patient. No language interpreter was used.    Past Medical History:  Diagnosis Date  . GERD (gastroesophageal reflux disease)   . Medical history non-contributory     Patient Active Problem List   Diagnosis Date Noted  . Status post vaginal delivery 02/29/2016  . Post-dates pregnancy 02/27/2016  . Influenza due to identified novel influenza A virus with other respiratory manifestations 08/11/2015  . Supervision of normal pregnancy, antepartum 09/26/2012    Past Surgical History:  Procedure Laterality Date  . NO PAST SURGERIES      OB History    Gravida Para Term Preterm AB Living   4 4 4     4    SAB TAB Ectopic Multiple Live Births         0 4       Home Medications    Prior to Admission medications   Medication Sig Start Date End Date  Taking? Authorizing Provider  acetaminophen (TYLENOL) 500 MG tablet Take 1,000 mg by mouth every 6 (six) hours as needed for mild pain, moderate pain or headache.     [provider]  albuterol (PROVENTIL HFA;VENTOLIN HFA) 108 (90 Base) MCG/ACT inhaler Inhale 1-2 puffs into the lungs every 6 (six) hours as needed for wheezing or shortness of breath.    [provider]  benzonatate (TESSALON) 100 MG capsule Take 1 capsule (100 mg total) by mouth 3 (three) times daily as needed for cough. 11/12/16   Nimah Uphoff, Lucita Lora, PA  cyclobenzaprine (FLEXERIL) 10 MG tablet Take 1 tablet (10 mg total) by mouth 2 (two) times daily as needed for muscle spasms. 08/09/16   Horton, Mayer Masker, MD  lansoprazole (PREVACID) 30 MG capsule Take 30 mg by mouth daily as needed (for indigestion/heartburn).    [provider]  naproxen (NAPROSYN) 500 MG tablet Take 1 tablet (500 mg total) by mouth 2 (two) times daily. 08/09/16   Horton, Mayer Masker, MD  predniSONE (DELTASONE) 10 MG tablet Take 4 tablets (40 mg total) by mouth daily. 11/12/16 11/17/16  Alvina Chou, PA    Family History Family History  Problem Relation Age of Onset  . Diabetes Father   . Diabetes Brother     Social History Social History  Substance Use Topics  . Smoking status: Never Smoker  . Smokeless tobacco: Never Used  . Alcohol use No  Comment: rarely     Allergies   Penicillins   Review of Systems Review of Systems  Constitutional: Negative for chills and fever.  HENT: Negative for rhinorrhea and sore throat.   Respiratory: Positive for cough. Negative for shortness of breath.   Cardiovascular: Positive for chest pain.  Gastrointestinal: Positive for vomiting. Negative for nausea.  Musculoskeletal: Positive for back pain.     Physical Exam Updated Vital Signs BP (!) 144/96 (BP Location: Left Arm)   Pulse 89   Temp 98.6 F (37 C) (Oral)   Resp 20   LMP 11/05/2016   SpO2 95%    Physical Exam  Constitutional: She is oriented to person, place, and time. She appears well-developed and well-nourished. No distress.  Well appearing. Non productive cough on exam.   HENT:  Head: Normocephalic and atraumatic.  Right Ear: External ear normal.  Left Ear: External ear normal.  Nose: Nose normal.  Mouth/Throat: Oropharynx is clear and moist. No oropharyngeal exudate.  Oropharynx without evidence of redness or exudates. Tonsils without evidence of redness, swelling, or exudates. TM's appear normal with no evidence of bulging. EAC appear non erythematous and not swollen  Eyes: EOM are normal. Pupils are equal, round, and reactive to light.  Neck: Normal range of motion.  Normal ROM. No nuchal rigidity.   Cardiovascular: Normal rate, normal heart sounds and intact distal pulses.   No murmur heard. Pulmonary/Chest: Effort normal and breath sounds normal. No respiratory distress. She has no wheezes. She has no rales.  Lungs CTA. No wheezing. No rales. No stridor. Normal work of breathing  Abdominal: Soft. There is no tenderness. There is no rebound and no guarding.  Soft and nontender. No rebound. No guarding. Negative murphy's sign. No focal tenderness at McBurney's point. No CVA tenderness. No evidence of hernia  Neurological: She is alert and oriented to person, place, and time.  Skin: Skin is warm. Capillary refill takes less than 2 seconds. No rash noted.  Psychiatric: She has a normal mood and affect. Her behavior is normal.  Nursing note and vitals reviewed.    ED Treatments / Results  DIAGNOSTIC STUDIES:  Oxygen Saturation is 95% on RA, adequate  by my interpretation.    COORDINATION OF CARE:  9:48 PM Discussed treatment plan with pt at bedside and pt agreed to plan.  Labs (all labs ordered are listed, but only abnormal results are displayed) Labs Reviewed - No data to display  EKG  EKG Interpretation None       Radiology Dg Chest 2 View  Result  Date: 11/12/2016 CLINICAL DATA:  Persistent dry cough beginning 1 month ago, associated back in central chest pain, history of bronchitis, worsened coughing at night and when lying flat, some shortness of breath and vomiting after prolonged coughing EXAM: CHEST  2 VIEW COMPARISON:  CT chest 08/09/2016 FINDINGS: Normal heart size, mediastinal contours, and pulmonary vascularity. Lungs clear. No pleural effusion or pneumothorax. Bones unremarkable. IMPRESSION: Normal exam. Electronically Signed   By: Ulyses SouthwardMark  Boles M.D.   On: 11/12/2016 22:07    Procedures Procedures (including critical care time)  Medications Ordered in ED Medications  predniSONE (DELTASONE) tablet 60 mg (not administered)  albuterol (PROVENTIL HFA;VENTOLIN HFA) 108 (90 Base) MCG/ACT inhaler 2 puff (not administered)     Initial Impression / Assessment and Plan / ED Course  I have reviewed the triage vital signs and the nursing notes.  Pertinent labs & imaging results that were available during my care of the patient  were reviewed by me and considered in my medical decision making (see chart for details).      Patient with likely viral bronchitis. No concern for PNA, pneumothorax, or effusion given normal lung exam/x-ray. Antibiotics not indicated. Conservative therapy indicated. Patient told to follow up with PCP. Return precautions given for new or worsening symptoms. Patient acknowledges and agrees with assessment and plan.    Final Clinical Impressions(s) / ED Diagnoses   Final diagnoses:  Cough    New Prescriptions New Prescriptions   BENZONATATE (TESSALON) 100 MG CAPSULE    Take 1 capsule (100 mg total) by mouth 3 (three) times daily as needed for cough.   PREDNISONE (DELTASONE) 10 MG TABLET    Take 4 tablets (40 mg total) by mouth daily.    I personally performed the services described in this documentation, which was scribed in my presence. The recorded information has been reviewed and is accurate.      Candie Mile [place], [place] 11/12/16 2250    Nira Conn, MD 11/13/16 0130

## 2016-11-12 NOTE — ED Notes (Signed)
PT DISCHARGED. INSTRUCTIONS AND PRESCRIPTIONS GIVEN. AAOX4. PT IN NO APPARENT DISTRESS WITH MILD PAIN. THE OPPORTUNITY TO ASK QUESTIONS WAS PROVIDED. 

## 2017-07-22 ENCOUNTER — Emergency Department (HOSPITAL_COMMUNITY)
Admission: EM | Admit: 2017-07-22 | Discharge: 2017-07-22 | Disposition: A | Discharge status: Home / Self Care | Payer: Self-pay | Attending: Emergency Medicine | Admitting: Emergency Medicine

## 2017-07-22 ENCOUNTER — Encounter (HOSPITAL_COMMUNITY): Payer: Self-pay | Admitting: Emergency Medicine

## 2017-07-22 ENCOUNTER — Emergency Department (HOSPITAL_COMMUNITY): Payer: Self-pay

## 2017-07-22 DIAGNOSIS — R05 Cough: Secondary | ICD-10-CM | POA: Insufficient documentation

## 2017-07-22 DIAGNOSIS — R053 Chronic cough: Secondary | ICD-10-CM

## 2017-07-22 DIAGNOSIS — Z79899 Other long term (current) drug therapy: Secondary | ICD-10-CM | POA: Insufficient documentation

## 2017-07-22 LAB — I-STAT TROPONIN, ED: TROPONIN I, POC: 0 ng/mL (ref 0.00–0.08)

## 2017-07-22 LAB — CBC
HCT: 39.4 % (ref 36.0–46.0)
Hemoglobin: 12.4 g/dL (ref 12.0–15.0)
MCH: 25.9 pg — ABNORMAL LOW (ref 26.0–34.0)
MCHC: 31.5 g/dL (ref 30.0–36.0)
MCV: 82.3 fL (ref 78.0–100.0)
Platelets: 308 10*3/uL (ref 150–400)
RBC: 4.79 MIL/uL (ref 3.87–5.11)
RDW: 14.8 % (ref 11.5–15.5)
WBC: 9.7 10*3/uL (ref 4.0–10.5)

## 2017-07-22 LAB — BASIC METABOLIC PANEL
Anion gap: 9 (ref 5–15)
BUN: 5 mg/dL — AB (ref 6–20)
CALCIUM: 9.1 mg/dL (ref 8.9–10.3)
CO2: 25 mmol/L (ref 22–32)
CREATININE: 0.7 mg/dL (ref 0.44–1.00)
Chloride: 105 mmol/L (ref 101–111)
GFR calc Af Amer: >60 mL/min (ref >60)
GLUCOSE: 113 mg/dL — AB (ref 65–99)
Potassium: 3.9 mmol/L (ref 3.5–5.1)
Sodium: 139 mmol/L (ref 135–145)

## 2017-07-22 LAB — I-STAT BETA HCG BLOOD, ED (MC, WL, AP ONLY)

## 2017-07-22 MED ORDER — PREDNISONE 10 MG PO TABS: 60.0000 mg | ORAL_TABLET | Freq: Every day | ORAL | 0 refills | Status: DC

## 2017-07-22 MED ORDER — ALBUTEROL SULFATE HFA 108 (90 BASE) MCG/ACT IN AERS: 1.0000 | INHALATION_SPRAY | Freq: Four times a day (QID) | RESPIRATORY_TRACT | 1 refills | Status: DC | PRN

## 2017-07-22 MED ORDER — CETIRIZINE HCL 5 MG PO TABS: 5.0000 mg | ORAL_TABLET | Freq: Every day | ORAL | 0 refills | Status: DC

## 2017-07-22 MED ORDER — OMEPRAZOLE 20 MG PO CPDR: 20.0000 mg | DELAYED_RELEASE_CAPSULE | Freq: Every day | ORAL | 0 refills | Status: DC

## 2017-07-22 NOTE — ED Triage Notes (Signed)
Pt c/o cough intermittent x 1.5 months. Pt has been treated for bronchitis, pt states tonight after work she felt pressure in chest after lying flat, several episodes of emesis.  Pt c/o R back pain at this time.  unkn fever.

## 2017-07-22 NOTE — Discharge Instructions (Signed)
You are seen in the ER for a cough that has been going on for more than 6 weeks now. At this time we recommend that you see a primary care doctor or pulmonologist. Since he do not have insurance, we are recommending that you take the medicines prescribed to see if they help you with your symptoms.  If the symptoms persist despite taking her medications, then consider seeing a pulmonologist.  Return to the ER if there is worsening shortness of breath, bloody sputum, chest pain with breathing.

## 2017-07-22 NOTE — ED Provider Notes (Addendum)
[facility] Provider Note   CSN: [id number] Arrival date & time: 07/22/17  0213     History   Chief Complaint Chief Complaint  Patient presents with  . Cough    HPI Victoria Schneider is a 37 y.o. female.  HPI  37 year old female comes in with chief complaint of cough.  Patient has history of GERD.  Patient denies any history of lung disease.  She states that her cough has been present since early December.  Patient has been on 2 rounds of antibiotics without any relief.  Patient is currently taking clindamycin.  Patient denies any associated URI like symptoms.  Cough is mostly nonproductive, worse at nighttime.  Patient denies any yellow-green phlegm, or blood tinged sputum.  Patient also denies any night sweats, fevers, unexplained weight loss, or smoking.  Pt has no hx of PE, DVT and denies any exogenous hormone (testosterone / estrogen) use, long distance travels or surgery in the past 6 weeks, active cancer, recent immobilization.   Past Medical History:  Diagnosis Date  . GERD (gastroesophageal reflux disease)   . Medical history non-contributory     Patient Active Problem List   Diagnosis Date Noted  . Status post vaginal delivery 02/29/2016  . Post-dates pregnancy 02/27/2016  . Influenza due to identified novel influenza A virus with other respiratory manifestations 08/11/2015  . Supervision of normal pregnancy, antepartum 09/26/2012    Past Surgical History:  Procedure Laterality Date  . NO PAST SURGERIES      OB History    Gravida Para Term Preterm AB Living   4 4 4     4    SAB TAB Ectopic Multiple Live Births         0 4       Home Medications    Prior to Admission medications   Medication Sig Start Date End Date Taking? Authorizing Provider  acetaminophen (TYLENOL) 500 MG tablet Take 1,000 mg by mouth every 6 (six) hours as needed for mild pain, moderate pain or headache.     [provider]    albuterol (PROVENTIL HFA;VENTOLIN HFA) 108 (90 Base) MCG/ACT inhaler Inhale 1-2 puffs into the lungs every 6 (six) hours as needed for wheezing or shortness of breath. 07/22/17   Derwood KaplanNanavati, Dontarius Sheley, MD  benzonatate (TESSALON) 100 MG capsule Take 1 capsule (100 mg total) by mouth 3 (three) times daily as needed for cough. 11/12/16   Alvina ChouEspina, Francisco Manuel, PA  cetirizine (ZYRTEC) 5 MG tablet Take 1 tablet (5 mg total) by mouth daily. 07/22/17   Derwood KaplanNanavati, Nickie Warwick, MD  cyclobenzaprine (FLEXERIL) 10 MG tablet Take 1 tablet (10 mg total) by mouth 2 (two) times daily as needed for muscle spasms. 08/09/16   Horton, Mayer Maskerourtney F, MD  lansoprazole (PREVACID) 30 MG capsule Take 30 mg by mouth daily as needed (for indigestion/heartburn).    [provider]  naproxen (NAPROSYN) 500 MG tablet Take 1 tablet (500 mg total) by mouth 2 (two) times daily. 08/09/16   Horton, Mayer Maskerourtney F, MD  omeprazole (PRILOSEC) 20 MG capsule Take 1 capsule (20 mg total) by mouth daily. 07/22/17   Derwood KaplanNanavati, Treylin Burtch, MD  predniSONE (DELTASONE) 10 MG tablet Take 6 tablets (60 mg total) by mouth daily. 07/22/17   Derwood KaplanNanavati, Alyzza Andringa, MD    Family History Family History  Problem Relation Age of Onset  . Diabetes Father   . Diabetes Brother     Social History Social History   Tobacco Use  .  Smoking status: Never Smoker  . Smokeless tobacco: Never Used  Substance Use Topics  . Alcohol use: No    Comment: rarely  . Drug use: No     Allergies   Penicillins   Review of Systems Review of Systems  Constitutional: Negative for activity change and fever.  Respiratory: Positive for cough.   Cardiovascular: Negative for chest pain.  Allergic/Immunologic: Negative for immunocompromised state.     Physical Exam Updated Vital Signs BP 128/81 (BP Location: Right Arm)   Pulse 80   Temp 98.1 F (36.7 C) (Oral)   Resp 20   Ht 5\' 5"  (1.651 m)   Wt 96.2 kg (212 lb)   LMP 07/15/2017   SpO2 100%   BMI 35.28 kg/m   Physical Exam   Constitutional: She is oriented to person, place, and time. She appears well-developed.  HENT:  Head: Normocephalic and atraumatic.  Eyes: EOM are normal.  Neck: Normal range of motion. Neck supple.  Cardiovascular: Normal rate.  Pulmonary/Chest: Effort normal. No stridor. No respiratory distress. She has no wheezes.  Abdominal: Soft. Bowel sounds are normal. There is no tenderness.  Musculoskeletal: She exhibits no edema.  Neurological: She is alert and oriented to person, place, and time.  Skin: Skin is warm and dry.  Nursing note and vitals reviewed.    ED Treatments / Results  Labs (all labs ordered are listed, but only abnormal results are displayed) Labs Reviewed  BASIC METABOLIC PANEL - Abnormal; Notable for the following components:      Result Value   Glucose, Bld 113 (*)    BUN 5 (*)    All other components within normal limits  CBC - Abnormal; Notable for the following components:   MCH 25.9 (*)    All other components within normal limits  I-STAT TROPONIN, ED  I-STAT BETA HCG BLOOD, ED ([facility], [facility], [facility] ONLY)    EKG  EKG Interpretation  Date/Time:  Sunday July 22 2017 02:39:53 EST Ventricular Rate:  88 PR Interval:  140 QRS Duration: 84 QT Interval:  384 QTC Calculation: 464 R Axis:   60 Text Interpretation:  Normal sinus rhythm Normal ECG No acute changes No significant change since last tracing Confirmed by Cyndel Griffey ([id number]) on 07/22/2017 5:07:21 AM       Radiology Dg Chest 2 View  Result Date: 07/22/2017 CLINICAL DATA:  Intermittent cough for 1.5 months. Tonight pressure in the chest and emesis. Right back pain. EXAM: CHEST  2 VIEW COMPARISON:  11/12/2016 FINDINGS: The heart size and mediastinal contours are within normal limits. Both lungs are clear. The visualized skeletal structures are unremarkable. IMPRESSION: No active cardiopulmonary disease. Electronically Signed   By: William  Stevens M.D.   On: 07/22/2017 03:02    Procedures Procedures  (including critical care time)  Medications Ordered in ED Medications - No data to display   Initial Impression / Assessment and Plan / ED Course  I have reviewed the triage vital signs and the nursing notes.  Pertinent labs & imaging results that were available during my care of the patient were reviewed by me and considered in my medical decision making (see chart for details).     37  year old female comes in with chief complaint of cough.  Cough has been ongoing for several weeks now.  By definition, patient is having chronic cough.  Patient has been seen by an urgent care provider, and given antibiotics.  Patient's symptoms have persisted.  She has no associated wheezing.  No  concerns for PE and patient is PERC negative.  EKG is also reassuring. Patient does not smoke, and symptoms not consistent with TB either.  Chest x-ray is clear.  Differential diagnosis leans towards etiologies for chronic cough now.  We will start patient on albuterol and prednisone.  If patient's symptoms do not improve then she will start taking Zyrtec and omeprazole.  Patient does not have insurance therefore we are undertaking the therapeutic pathway.  Patient has been advised to see a pulmonologist if her symptoms do not clear.  Final Clinical Impressions(s) / ED Diagnoses   Final diagnoses:  Chronic cough    ED Discharge Orders        Ordered    albuterol (PROVENTIL HFA;VENTOLIN HFA) 108 (90 Base) MCG/ACT inhaler  Every 6 hours PRN     07/22/17 0534    predniSONE (DELTASONE) 10 MG tablet  Daily     07/22/17 0534    cetirizine (ZYRTEC) 5 MG tablet  Daily     07/22/17 0535    omeprazole (PRILOSEC) 20 MG capsule  Daily     07/22/17 0535       Derwood Kaplan, MD 07/22/17 1610    Derwood Kaplan, MD 07/27/17 1140    Derwood Kaplan, MD 08/05/17 1534
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
## 2018-07-10 ENCOUNTER — Encounter: Payer: Self-pay | Admitting: Emergency Medicine

## 2018-07-10 ENCOUNTER — Ambulatory Visit
Admission: EM | Admit: 2018-07-10 | Discharge: 2018-07-10 | Disposition: A | Discharge status: Home / Self Care | Payer: Self-pay | Attending: Family Medicine | Admitting: Family Medicine

## 2018-07-10 DIAGNOSIS — J22 Unspecified acute lower respiratory infection: Secondary | ICD-10-CM | POA: Insufficient documentation

## 2018-07-10 DIAGNOSIS — R05 Cough: Secondary | ICD-10-CM | POA: Insufficient documentation

## 2018-07-10 DIAGNOSIS — R053 Chronic cough: Secondary | ICD-10-CM

## 2018-07-10 MED ORDER — AZITHROMYCIN 250 MG PO TABS: 250.0000 mg | ORAL_TABLET | Freq: Every day | ORAL | 0 refills | Status: DC

## 2018-07-10 MED ORDER — BENZONATATE 200 MG PO CAPS: 200.0000 mg | ORAL_CAPSULE | Freq: Two times a day (BID) | ORAL | 0 refills | Status: DC | PRN

## 2018-07-10 MED ORDER — DM-GUAIFENESIN ER 30-600 MG PO TB12: 1.0000 | ORAL_TABLET | Freq: Two times a day (BID) | ORAL | 0 refills | Status: DC

## 2018-07-10 NOTE — ED Provider Notes (Signed)
[name]    CSN: [id number] Arrival date & time: 07/10/18  1537     History   Chief Complaint Chief Complaint  Patient presents with  . Otalgia  . Cough    HPI Victoria Schneider is a 38 y.o. female.   HPI Patient is here for chronic cough.  Has been present for about 6 weeks.  She states she started off thinking she had the "flu".  Most of her symptoms are gotten better.  She does since the cough is persisting.  The cough is worse at night.  She had a similar presentation last winter.  She thinks she gets this every year.  She has been prescribed a number of antibiotics, antacids, albuterol, prednisone.  She is uncertain what finally worked last year.  She is not on any PPI at this time and does not think she is having heartburn. She also has new symptoms of sore throat for 3 days and ear pressure and pain.  This is on top of her coughing.  No fever or chills.  No nausea or vomiting.  No headache.  No known exposure to strep although she has 4 children at home.  At the time of 4 children are well Past Medical History:  Diagnosis Date  . GERD (gastroesophageal reflux disease)   . Medical history non-contributory     Patient Active Problem List   Diagnosis Date Noted  . Status post vaginal delivery 02/29/2016  . Post-dates pregnancy 02/27/2016  . Influenza due to identified novel influenza A virus with other respiratory manifestations 08/11/2015  . Supervision of normal pregnancy, antepartum 09/26/2012    Past Surgical History:  Procedure Laterality Date  . NO PAST SURGERIES      OB History    Gravida  4   Para  4   Term  4   Preterm      AB      Living  4     SAB      TAB      Ectopic      Multiple  0   Live Births  4            Home Medications    Prior to Admission medications   Medication Sig Start Date End Date Taking? Authorizing Provider  acetaminophen (TYLENOL) 500 MG tablet Take 1,000 mg by mouth every 6 (six)  hours as needed for mild pain, moderate pain or headache.     [provider]  azithromycin (ZITHROMAX) 250 MG tablet Take 1 tablet (250 mg total) by mouth daily. Take first 2 tablets together, then 1 every day until finished. 07/10/18   Eustace MooreNelson, Briyanna Billingham Sue, MD  benzonatate (TESSALON) 200 MG capsule Take 1 capsule (200 mg total) by mouth 2 (two) times daily as needed for cough. 07/10/18   Eustace MooreNelson, Shep Porter Sue, MD  dextromethorphan-guaiFENesin [facility](MUCINEX DM) 30-600 MG 12hr tablet Take 1 tablet by mouth 2 (two) times daily. 07/10/18   Eustace MooreNelson, Amiri Riechers Sue, MD    Family History Family History  Problem Relation Age of Onset  . Diabetes Father   . Diabetes Brother     Social History Social History   Tobacco Use  . Smoking status: Never Smoker  . Smokeless tobacco: Never Used  Substance Use Topics  . Alcohol use: No    Comment: rarely  . Drug use: No     Allergies   Penicillins   Review of Systems Review of Systems  Constitutional: Negative for  chills and fever.  HENT: Positive for ear pain and sore throat.   Eyes: Negative for pain and visual disturbance.  Respiratory: Positive for cough. Negative for shortness of breath.   Cardiovascular: Negative for chest pain and palpitations.  Gastrointestinal: Negative for abdominal pain and vomiting.  Genitourinary: Negative for dysuria and hematuria.  Musculoskeletal: Negative for arthralgias and back pain.  Skin: Negative for color change and rash.  Neurological: Negative for seizures and syncope.  All other systems reviewed and are negative.    Physical Exam Triage Vital Signs ED Triage Vitals  Enc Vitals Group     BP 07/10/18 1546 124/75     Pulse Rate 07/10/18 1546 88     Resp 07/10/18 1546 16     Temp 07/10/18 1546 98.3 F (36.8 C)     Temp src --      SpO2 07/10/18 1546 95 %     Weight --      Height --      Head Circumference --      Peak Flow --      Pain Score 07/10/18 1547 5     Pain Loc --      Pain Edu? --       Excl. in GC? --    No data found.  Updated Vital Signs BP 124/75   Pulse 88   Temp 98.3 F (36.8 C)   Resp 16   LMP 06/19/2018   SpO2 95%   Visual Acuity Right Eye Distance:   Left Eye Distance:   Bilateral Distance:    Right Eye Near:   Left Eye Near:    Bilateral Near:     Physical Exam Constitutional:      General: She is not in acute distress.    Appearance: She is well-developed and normal weight.  HENT:     Head: Normocephalic and atraumatic.     Right Ear: Tympanic membrane and ear canal normal.     Left Ear: Tympanic membrane and ear canal normal.     Nose: Congestion present.     Mouth/Throat:     Mouth: Mucous membranes are moist.     Comments: Tonsils are large, pink, no exudate Eyes:     Conjunctiva/sclera: Conjunctivae normal.     Pupils: Pupils are equal, round, and reactive to light.  Neck:     Musculoskeletal: Normal range of motion.  Cardiovascular:     Rate and Rhythm: Normal rate and regular rhythm.     Heart sounds: Normal heart sounds.  Pulmonary:     Effort: Pulmonary effort is normal. No respiratory distress.     Breath sounds: Normal breath sounds.  Abdominal:     General: There is no distension.     Palpations: Abdomen is soft.  Musculoskeletal: Normal range of motion.  Skin:    General: Skin is warm and dry.  Neurological:     Mental Status: She is alert.      UC Treatments / Results  Labs (all labs ordered are listed, but only abnormal results are displayed) Labs Reviewed - No data to display  EKG None  Radiology No results found.  Procedures Procedures (including critical care time)  Medications Ordered in UC Medications - No data to display  Initial Impression / Assessment and Plan / UC Course  I have reviewed the triage vital signs and the nursing notes.  Pertinent labs & imaging results that were available during my care of the patient were reviewed by  me and considered in my medical decision making (see  chart for details).     *Reviewed with patient that this is likely a viral bronchitis with a persistent irritant cough.  Because of the persistence of her symptoms we will give her a course of antibiotics.  We will give her Tessalon and Mucinex DM for cough management.  She is to push fluids, use humidifier, and see her primary care doctor if not better in a week Final Clinical Impressions(s) / UC Diagnoses   Final diagnoses:  Chronic cough  LRTI (lower respiratory tract infection)     Discharge Instructions     Take the z pak as instructed Take 2 today then one a day until gone Take both the tessalon plus the mucinex DM twice a day Drink plenty of water Use a humidifier if you have one See your PCP if not better in a week     ED Prescriptions    Medication Sig Dispense Auth. Provider   azithromycin (ZITHROMAX) 250 MG tablet Take 1 tablet (250 mg total) by mouth daily. Take first 2 tablets together, then 1 every day until finished. 6 tablet Eustace Moore, MD   dextromethorphan-guaiFENesin [facility] DM) 30-600 MG 12hr tablet Take 1 tablet by mouth 2 (two) times daily. 20 tablet Eustace Moore, MD   benzonatate (TESSALON) 200 MG capsule Take 1 capsule (200 mg total) by mouth 2 (two) times daily as needed for cough. 20 capsule Eustace Moore, MD     Controlled Substance Prescriptions [place] Controlled Substance Registry consulted? Not Applicable   Eustace Moore, MD 07/10/18 [phone]

## 2018-07-10 NOTE — ED Triage Notes (Signed)
Pt c/o L ear pain, states her tonsils are swollen as well. Pt c/o cough. States shes been coughing for over six weeks. Ear pain x3 days.

## 2018-07-10 NOTE — Discharge Instructions (Signed)
Take the z pak as instructed Take 2 today then one a day until gone Take both the tessalon plus the mucinex DM twice a day Drink plenty of water Use a humidifier if you have one See your PCP if not better in a week

## 2018-08-28 ENCOUNTER — Encounter: Payer: Self-pay | Admitting: Family Medicine

## 2018-09-02 ENCOUNTER — Ambulatory Visit: Payer: Self-pay | Admitting: Family Medicine

## 2018-09-18 ENCOUNTER — Ambulatory Visit
Admission: EM | Admit: 2018-09-18 | Discharge: 2018-09-18 | Disposition: A | Discharge status: Home / Self Care | Payer: Self-pay | Attending: Physician Assistant | Admitting: Physician Assistant

## 2018-09-18 DIAGNOSIS — J3489 Other specified disorders of nose and nasal sinuses: Secondary | ICD-10-CM

## 2018-09-18 DIAGNOSIS — R05 Cough: Secondary | ICD-10-CM

## 2018-09-18 DIAGNOSIS — R059 Cough, unspecified: Secondary | ICD-10-CM

## 2018-09-18 DIAGNOSIS — J3089 Other allergic rhinitis: Secondary | ICD-10-CM

## 2018-09-18 MED ORDER — BENZONATATE 200 MG PO CAPS: 200.0000 mg | ORAL_CAPSULE | Freq: Three times a day (TID) | ORAL | 0 refills | Status: DC

## 2018-09-18 MED ORDER — FLUTICASONE PROPIONATE 50 MCG/ACT NA SUSP
2.0000 | Freq: Every day | NASAL | 0 refills | Status: DC
Start: 2018-09-18 — End: 2019-01-23

## 2018-09-18 MED ORDER — DOXYCYCLINE HYCLATE 100 MG PO CAPS: 100.0000 mg | ORAL_CAPSULE | Freq: Two times a day (BID) | ORAL | 0 refills | Status: DC

## 2018-09-18 NOTE — ED Provider Notes (Signed)
[name]    CSN: [id number] Arrival date & time: 09/18/18  1108     History   Chief Complaint Chief Complaint  Patient presents with  . Cough    HPI Victoria Schneider is a 38 y.o. female.   38 year old female comes in for 3-week history of cough.  Patient states her symptoms started shortly after her kids got sick, and has continued since.  However, she thinks it is partially caused by allergies as well.  She has had rhinorrhea, sneezing, ear fullness intermittently.  States work requires her to walk into cold environments, and has felt the cough worsened during that time.  She has had some head pressure that is relieved after taking Zyrtec.  Her cough is mostly dry, but can be productive at times, more at nighttime.  She is felt like she needed to take a deeper breath occasionally, but denies specific shortness of breath, wheezing, chest pain.  Denies fever, chills, night sweats. States has some chest and back aching with cough. She has also taken Mucinex with mild relief.  Never smoker.  No travels.     Past Medical History:  Diagnosis Date  . GERD (gastroesophageal reflux disease)     There are no active problems to display for this patient.   Past Surgical History:  Procedure Laterality Date  . NO PAST SURGERIES      OB History    Gravida  4   Para  4   Term  4   Preterm      AB      Living  4     SAB      TAB      Ectopic      Multiple  0   Live Births  4            Home Medications    Prior to Admission medications   Medication Sig Start Date End Date Taking? Authorizing Provider  benzonatate (TESSALON) 200 MG capsule Take 1 capsule (200 mg total) by mouth every 8 (eight) hours. 09/18/18   Cathie Hoops, Zackariah Vanderpol V, PA-C  doxycycline (VIBRAMYCIN) 100 MG capsule Take 1 capsule (100 mg total) by mouth 2 (two) times daily. 09/18/18   Cathie Hoops, Rickita Forstner V, PA-C  fluticasone (FLONASE) 50 MCG/ACT nasal spray Place 2 sprays into both nostrils daily. 09/18/18    Cathie Hoops, Everlean Bucher V, PA-C  Vitamin D, Ergocalciferol, (DRISDOL) 1.25 MG (50000 UT) CAPS capsule Take 1 capsule by mouth once a week for 3 months. 06/06/18   [provider]    Family History Family History  Problem Relation Age of Onset  . Diabetes Father   . Hypertension Father   . Diabetes Brother   . Hypertension Mother   . Depression Daughter     Social History Social History   Tobacco Use  . Smoking status: Never Smoker  . Smokeless tobacco: Never Used  Substance Use Topics  . Alcohol use: Not Currently    Comment: rarely  . Drug use: No     Allergies   Penicillins   Review of Systems Review of Systems  Reason unable to perform ROS: See HPI as above.     Physical Exam Triage Vital Signs ED Triage Vitals  Enc Vitals Group     BP 09/18/18 1117 126/71     Pulse Rate 09/18/18 1117 74     Resp 09/18/18 1117 18     Temp 09/18/18 1117 98.6 F (37 C)  Temp Source 09/18/18 1117 Oral     SpO2 09/18/18 1117 98 %     Weight --      Height --      Head Circumference --      Peak Flow --      Pain Score 09/18/18 1118 6     Pain Loc --      Pain Edu? --      Excl. in GC? --    No data found.  Updated Vital Signs BP 126/71 (BP Location: Left Arm)   Pulse 74   Temp 98.6 F (37 C) (Oral)   Resp 18   LMP 08/21/2018   SpO2 98%   Physical Exam Constitutional:      General: She is not in acute distress.    Appearance: She is well-developed. She is not ill-appearing, toxic-appearing or diaphoretic.  HENT:     Head: Normocephalic and atraumatic.     Right Ear: Tympanic membrane, ear canal and external ear normal. Tympanic membrane is not erythematous or bulging.     Left Ear: Tympanic membrane, ear canal and external ear normal. Tympanic membrane is not erythematous or bulging.     Nose:     Right Turbinates: Swollen.     Right Sinus: Maxillary sinus tenderness present. No frontal sinus tenderness.     Left Sinus: Maxillary sinus tenderness present. No  frontal sinus tenderness.     Mouth/Throat:     Mouth: Mucous membranes are moist.     Pharynx: Oropharynx is clear. Uvula midline.  Eyes:     Conjunctiva/sclera: Conjunctivae normal.     Pupils: Pupils are equal, round, and reactive to light.  Neck:     Musculoskeletal: Normal range of motion and neck supple.  Cardiovascular:     Rate and Rhythm: Normal rate and regular rhythm.     Heart sounds: Normal heart sounds. No murmur. No friction rub. No gallop.   Pulmonary:     Effort: Pulmonary effort is normal. No accessory muscle usage, prolonged expiration, respiratory distress or retractions.     Breath sounds: Normal breath sounds. No stridor, decreased air movement or transmitted upper airway sounds. No decreased breath sounds, wheezing, rhonchi or rales.  Chest:     Chest wall: Tenderness present.  Musculoskeletal:     Comments: Diffuse thoracic tenderness to palpation.   Skin:    General: Skin is warm and dry.  Neurological:     Mental Status: She is alert and oriented to person, place, and time.      UC Treatments / Results  Labs (all labs ordered are listed, but only abnormal results are displayed) Labs Reviewed - No data to display  EKG None  Radiology No results found.  Procedures Procedures (including critical care time)  Medications Ordered in UC Medications - No data to display  Initial Impression / Assessment and Plan / UC Course  I have reviewed the triage vital signs and the nursing notes.  Pertinent labs & imaging results that were available during my care of the patient were reviewed by me and considered in my medical decision making (see chart for details).    No alarming signs on exam.  Patient's lungs clear to auscultation bilaterally without adventitious lung sounds.  Discussed allergic rhinitis versus sinusitis.  We will add Flonase, and other symptomatic treatment.  Written Rx of doxycycline provided, patient can start if symptoms not improving  to cover for bacterial sinusitis.  Return precautions given.  Patient expresses understanding  and agrees to plan.  Final Clinical Impressions(s) / UC Diagnoses   Final diagnoses:  Seasonal allergic rhinitis due to other allergic trigger  Sinus pressure  Cough    ED Prescriptions    Medication Sig Dispense Auth. Provider   fluticasone (FLONASE) 50 MCG/ACT nasal spray Place 2 sprays into both nostrils daily. 1 g Elizjah Noblet V, PA-C   benzonatate (TESSALON) 200 MG capsule Take 1 capsule (200 mg total) by mouth every 8 (eight) hours. 30 capsule Cathie Hoops, Lanie Schelling V, PA-C   doxycycline (VIBRAMYCIN) 100 MG capsule Take 1 capsule (100 mg total) by mouth 2 (two) times daily. 20 capsule Threasa Alpha, [place] 09/18/18 1155

## 2018-09-18 NOTE — Discharge Instructions (Signed)
Tessalon for cough. Start flonase, zyrtec for nasal congestion/drainage. You can use over the counter nasal saline rinse such as neti pot for nasal congestion. Keep hydrated, your urine should be clear to pale yellow in color. Tylenol/motrin for fever and pain. Monitor for any worsening of symptoms, chest pain, shortness of breath, wheezing, swelling of the throat, follow up for reevaluation.   If symptoms not improving, can fill doxycycline for possible sinus infection.

## 2018-09-18 NOTE — ED Triage Notes (Signed)
Pt c/o cough x3wks and mid to upper back pain with no known injury

## 2018-09-23 ENCOUNTER — Encounter: Payer: Self-pay | Admitting: Family Medicine

## 2018-09-23 ENCOUNTER — Ambulatory Visit
Admission: EM | Admit: 2018-09-23 | Discharge: 2018-09-23 | Discharge status: Absent without leave | Payer: Self-pay | Attending: Family Medicine | Admitting: Family Medicine

## 2018-10-04 ENCOUNTER — Ambulatory Visit
Admission: EM | Admit: 2018-10-04 | Discharge: 2018-10-04 | Disposition: A | Discharge status: Home / Self Care | Payer: Self-pay | Attending: Family Medicine | Admitting: Family Medicine

## 2018-10-04 ENCOUNTER — Encounter: Payer: Self-pay | Admitting: Emergency Medicine

## 2018-10-04 ENCOUNTER — Ambulatory Visit (INDEPENDENT_AMBULATORY_CARE_PROVIDER_SITE_OTHER): Payer: Self-pay

## 2018-10-04 ENCOUNTER — Other Ambulatory Visit: Payer: Self-pay

## 2018-10-04 DIAGNOSIS — R059 Cough, unspecified: Secondary | ICD-10-CM

## 2018-10-04 DIAGNOSIS — R05 Cough: Secondary | ICD-10-CM

## 2018-10-04 MED ORDER — ALBUTEROL SULFATE HFA 108 (90 BASE) MCG/ACT IN AERS: 1.0000 | INHALATION_SPRAY | Freq: Four times a day (QID) | RESPIRATORY_TRACT | 0 refills | Status: DC | PRN

## 2018-10-04 MED ORDER — OMEPRAZOLE 20 MG PO CPDR: 20.0000 mg | DELAYED_RELEASE_CAPSULE | Freq: Every day | ORAL | 0 refills | Status: DC

## 2018-10-04 MED ORDER — PREDNISONE 10 MG PO TABS: 40.0000 mg | ORAL_TABLET | Freq: Every day | ORAL | 0 refills | Status: AC

## 2018-10-04 NOTE — ED Provider Notes (Signed)
[name]    CSN: [id number] Arrival date & time: 10/04/18  1207     History   Chief Complaint Chief Complaint  Patient presents with  . Cough    HPI Crosby OysterMaria G Toledo Zuniga is a 38 y.o. female.   Pt is a 38 year old female that presents with cough. This is a chronic problem for her. Reports that she has had this problem off an on since November. She was seen here 3 weeks ago and treated for sinusitis. She finished the medication and still has chronic cough with some production. She has been taking Flonase, zyrtec, and omeprazole. She has had some mild SOB with exertion and associated chest pain with breathing. Sensation she cant get a deep breath at times. She has also been using an albuterol inhaler as needed. This helps some. No fever, chills, sore throat, ear pain. No recent travels or sick contacts. No LE edema, hormone use. No hx of DVT or PE. She does not smoke.   ROS per HPI      Past Medical History:  Diagnosis Date  . GERD (gastroesophageal reflux disease)     There are no active problems to display for this patient.   Past Surgical History:  Procedure Laterality Date  . NO PAST SURGERIES      OB History    Gravida  4   Para  4   Term  4   Preterm      AB      Living  4     SAB      TAB      Ectopic      Multiple  0   Live Births  4            Home Medications    Prior to Admission medications   Medication Sig Start Date End Date Taking? Authorizing Provider  Vitamin D, Ergocalciferol, (DRISDOL) 1.25 MG (50000 UT) CAPS capsule Take 1 capsule by mouth once a week for 3 months. 06/06/18  Yes [provider]  albuterol (VENTOLIN HFA) 108 (90 Base) MCG/ACT inhaler Inhale 1-2 puffs into the lungs every 6 (six) hours as needed for wheezing or shortness of breath. 10/04/18   Carsyn Boster, Gloris Manchesterraci A, NP  fluticasone (FLONASE) 50 MCG/ACT nasal spray Place 2 sprays into both nostrils daily. 09/18/18   Cathie HoopsYu, Amy V, PA-C  omeprazole  (PRILOSEC) 20 MG capsule Take 1 capsule (20 mg total) by mouth daily. 10/04/18   Dahlia ByesBast, Haynes Giannotti A, NP  predniSONE (DELTASONE) 10 MG tablet Take 4 tablets (40 mg total) by mouth daily for 5 days. 10/04/18 10/09/18  Janace ArisBast, Cristal Howatt A, NP    Family History Family History  Problem Relation Age of Onset  . Diabetes Father   . Hypertension Father   . Diabetes Brother   . Hypertension Mother   . Depression Daughter     Social History Social History   Tobacco Use  . Smoking status: Never Smoker  . Smokeless tobacco: Never Used  Substance Use Topics  . Alcohol use: Not Currently    Comment: rarely  . Drug use: No     Allergies   Penicillins   Review of Systems Review of Systems   Physical Exam Triage Vital Signs ED Triage Vitals  Enc Vitals Group     BP 10/04/18 1226 122/82     Pulse Rate 10/04/18 1226 79     Resp 10/04/18 1226 14     Temp 10/04/18 1226 98  F (36.7 C)     Temp Source 10/04/18 1226 Oral     SpO2 10/04/18 1226 97 %     Weight --      Height --      Head Circumference --      Peak Flow --      Pain Score 10/04/18 1228 5     Pain Loc --      Pain Edu? --      Excl. in GC? --    No data found.  Updated Vital Signs BP 122/82 (BP Location: Left Arm)   Pulse 79   Temp 98 F (36.7 C) (Oral)   Resp 14   LMP 09/27/2018   SpO2 97%   Breastfeeding No   Visual Acuity Right Eye Distance:   Left Eye Distance:   Bilateral Distance:    Right Eye Near:   Left Eye Near:    Bilateral Near:     Physical Exam Vitals signs and nursing note reviewed.  Constitutional:      Appearance: Normal appearance.  HENT:     Head: Normocephalic and atraumatic.     Right Ear: Tympanic membrane and ear canal normal.     Left Ear: Tympanic membrane and ear canal normal.     Nose: Nose normal.     Mouth/Throat:     Pharynx: Oropharynx is clear.  Eyes:     Conjunctiva/sclera: Conjunctivae normal.  Neck:     Musculoskeletal: Normal range of motion.  Cardiovascular:      Rate and Rhythm: Normal rate and regular rhythm.     Pulses: Normal pulses.     Heart sounds: Normal heart sounds.  Pulmonary:     Effort: Pulmonary effort is normal. No respiratory distress.     Comments: Decreased lung sounds in all fields  Musculoskeletal: Normal range of motion.  Skin:    General: Skin is warm and dry.  Neurological:     Mental Status: She is alert.      UC Treatments / Results  Labs (all labs ordered are listed, but only abnormal results are displayed) Labs Reviewed - No data to display  EKG None  Radiology Dg Chest 2 View  Result Date: 10/04/2018 CLINICAL DATA:  Cough, shortness of breath, chest pain and congestion. Recent worsening of symptoms. EXAM: CHEST - 2 VIEW COMPARISON:  Chest x-ray dated 07/22/2017. FINDINGS: Heart size and mediastinal contours are within normal limits. Questionable perihilar nodule, only seen on the lateral projection, suspected superimposition of normal vascular structures. Lungs otherwise clear. No pleural effusion or pneumothorax seen. Osseous structures about the chest are unremarkable. IMPRESSION: 1. Questionable perihilar nodule, only seen on the lateral projection, most likely superimposition of normal vascular structures, less likely neoplastic process. Recommend chest CT with contrast to ensure benignity. 2. Lungs otherwise clear. No evidence of pneumonia or pulmonary edema. These results will be called to the ordering clinician or representative by the Radiologist Assistant, and communication documented in the PACS or zVision Dashboard. Electronically Signed   By: Bary Richard M.D.   On: 10/04/2018 12:53    Procedures Procedures (including critical care time)  Medications Ordered in UC Medications - No data to display  Initial Impression / Assessment and Plan / UC Course  I have reviewed the triage vital signs and the nursing notes.  Pertinent labs & imaging results that were available during my care of the patient  were reviewed by me and considered in my medical decision making (see chart for  details).    Cough-  X ray did not show anything acute.  Based on symptoms and exam, most likely bronchitis.  I am treating with prednisone daily for the next 5 days.  Albuterol inhaler to use as needed.  Continue the allergy meds.  Continue the omeprazole. If her symptoms continue she will need to follow up with primary care or specialist.  Pt understanding and agreed.   Final Clinical Impressions(s) / UC Diagnoses   Final diagnoses:  Cough     Discharge Instructions     I am going to treat you for inflammation in the lungs.  Prednisone daily for the next 5 days. Take this with food.  Albuterol inhaler to use as needed for cough, wheezing, SOB Make sure that you use your zyrtec daily and Flonase.  If our symptoms continue you may need to see a lung specialist for your chronic cough.   Another thing to keep in mind also is that acid reflux can cause a chronic cough. You could also try taking omeprazole daily for the next couple of weeks to see if this helps.     ED Prescriptions    Medication Sig Dispense Auth. Provider   albuterol (VENTOLIN HFA) 108 (90 Base) MCG/ACT inhaler Inhale 1-2 puffs into the lungs every 6 (six) hours as needed for wheezing or shortness of breath. 1 Inhaler Kentavious Michele A, NP   predniSONE (DELTASONE) 10 MG tablet Take 4 tablets (40 mg total) by mouth daily for 5 days. 20 tablet Warren Lindahl A, NP   omeprazole (PRILOSEC) 20 MG capsule Take 1 capsule (20 mg total) by mouth daily. 15 capsule Dahlia Byes A, NP     Controlled Substance Prescriptions [place] Controlled Substance Registry consulted? Not Applicable   Janace Aris, NP 10/04/18 1629

## 2018-10-04 NOTE — Discharge Instructions (Addendum)
I am going to treat you for inflammation in the lungs.  Prednisone daily for the next 5 days. Take this with food.  Albuterol inhaler to use as needed for cough, wheezing, SOB Make sure that you use your zyrtec daily and Flonase.  If our symptoms continue you may need to see a lung specialist for your chronic cough.   Another thing to keep in mind also is that acid reflux can cause a chronic cough. You could also try taking omeprazole daily for the next couple of weeks to see if this helps.

## 2018-10-04 NOTE — ED Triage Notes (Signed)
Pt here for persistent prod cough associated w/SOB onset 5 months  Reports she was seen here on 09/18/18 for similar sx.... sts she was dx w/bronchitis and was given oral antibiotics  Hx of allergies.... sts she's taking allergy meds dailyb   Denies fevers, vomiting, diarrhea  A&O x4... NAD.Marland Kitchen. ambulatory

## 2019-01-23 ENCOUNTER — Other Ambulatory Visit: Payer: Self-pay

## 2019-01-23 ENCOUNTER — Encounter: Payer: Self-pay | Admitting: Internal Medicine

## 2019-01-23 ENCOUNTER — Ambulatory Visit (INDEPENDENT_AMBULATORY_CARE_PROVIDER_SITE_OTHER): Payer: Self-pay

## 2019-01-23 ENCOUNTER — Ambulatory Visit (INDEPENDENT_AMBULATORY_CARE_PROVIDER_SITE_OTHER): Payer: Self-pay | Admitting: Internal Medicine

## 2019-01-23 DIAGNOSIS — R0609 Other forms of dyspnea: Secondary | ICD-10-CM

## 2019-01-23 DIAGNOSIS — R058 Other specified cough: Secondary | ICD-10-CM

## 2019-01-23 DIAGNOSIS — R05 Cough: Secondary | ICD-10-CM

## 2019-01-23 MED ORDER — PANTOPRAZOLE SODIUM 40 MG PO TBEC: 40.0000 mg | DELAYED_RELEASE_TABLET | Freq: Every day | ORAL | 2 refills | Status: DC

## 2019-01-23 MED ORDER — FAMOTIDINE 20 MG PO TABS
ORAL_TABLET | ORAL | 11 refills | Status: DC
Start: 2019-01-23 — End: 2020-02-09

## 2019-01-23 NOTE — Patient Instructions (Addendum)
Pantoprazole (protonix) 40 mg   Take  30-60 min before first meal of the day and Pepcid (famotidine)  20 mg one after supper  until return to office - this is the best way to tell whether stomach acid is contributing to your problem.    GERD (REFLUX)  is an extremely common cause of respiratory symptoms just like yours , many times with no obvious heartburn at all.    It can be treated with medication, but also with lifestyle changes including elevation of the head of your bed (ideally with 6 -8inch blocks under the headboard of your bed),  Smoking cessation, avoidance of late meals, excessive alcohol, and avoid fatty foods, chocolate, peppermint, colas, red wine, and acidic juices such as orange juice.  NO MINT OR MENTHOL PRODUCTS SO NO COUGH DROPS  USE SUGARLESS CANDY INSTEAD (Jolley ranchers or Stover's or Life Savers) or even ice chips will also do - the key is to swallow to prevent all throat clearing. NO OIL BASED VITAMINS - use powdered substitutes.  Avoid fish oil when coughing.  Please remember to go to the  x-ray department  for your tests - we will call you with the results when they are available    If not better in 4 weeks let me refer you to Dr Patsey Berthold

## 2019-01-23 NOTE — Progress Notes (Signed)
Victoria OysterMaria G Toledo Schneider, female    DOB: 02/17/1981,    MRN: [id number]   Brief patient profile:  38 yo [place]ina from [place]  since age 89 with  Last child 2018 bad reflux with  onset 10/2016 cough/cp/nasal congestion  > abx resolved  Similar patter with R cp 2/019  And  Nov 2019 pain both sides posteriorly  assoc painful coughing clear mucus s fever or sob dx as pna and resolved completely then 07/10/18  UC and  10/04/2018  > all better again unitl mid July 2020 rx  And referred to pulmonary clinic 01/23/2019 by Renold GentaPamerla Bradley p rx augmentin/singulair  > "no better"    History of Present Illness  01/23/2019  Pulmonary/ 1st office eval/Wert  Chief Complaint  Patient presents with  . Pulmonary Consult    Referred by Charma IgoPamela Bradley, NP for sob and cough. Patient reports that since having pneumonia she has suffered with sob and cough with exertion.   Dyspnea:  MMRC3 = can't walk 100 yards even at a slow pace at a flat grade s stopping due to sob  (note not the case at ov walk) Cough: daily  white minimal / cough is worse with cold air exp  Sleep: ok supine  SABA use: no better  Cp is generalized post worse with coughing fits   No obvious day to day or daytime variability or assoc excess/ purulent sputum or mucus plugs or hemoptysis or cp or chest tightness, subjective wheeze or overt sinus or hb symptoms.   Sleeping is actually ok  without nocturnal  or early am exacerbation  of respiratory  c/o's or need for noct saba. Also denies any obvious fluctuation of symptoms with weather or environmental changes or other aggravating or alleviating factors except as outlined above   No unusual exposure hx or h/o childhood pna/ asthma or knowledge of premature birth.  Current Allergies, Complete Past Medical History, Past Surgical History, Family History, and Social History were reviewed in [facility] electronic medical record.  ROS  The following are not active complaints unless bolded Hoarseness,  sore throat, dysphagia, dental problems, itching, sneezing,  nasal congestion or discharge of excess mucus or purulent secretions, ear ache,   fever, chills, sweats, unintended wt loss or wt gain, classically pleuritic or exertional cp,  orthopnea pnd or arm/hand swelling  or leg swelling, presyncope, palpitations, abdominal pain, anorexia, nausea, vomiting, diarrhea  or change in bowel habits or change in bladder habits, change in stools or change in urine, dysuria, hematuria,  rash, arthralgias, visual complaints, headache, numbness, weakness or ataxia or problems with walking or coordination,  change in mood or  memory.           Past Medical History:  Diagnosis Date  . GERD (gastroesophageal reflux disease)     Outpatient Medications Prior to Visit  Medication Sig Dispense Refill  . albuterol (VENTOLIN HFA) 108 (90 Base) MCG/ACT inhaler Inhale 1-2 puffs into the lungs every 6 (six) hours as needed for wheezing or shortness of breath. 1 Inhaler 0  . fluticasone (FLONASE) 50 MCG/ACT nasal spray Place 2 sprays into both nostrils daily. 1 g 0  . omeprazole (PRILOSEC) 20 MG capsule Take 1 capsule (20 mg total) by mouth daily. 15 capsule 0  . Vitamin D, Ergocalciferol, (DRISDOL) 1.25 MG (50000 UT) CAPS capsule Take 1 capsule by mouth once a week for 3 months.        Objective:     BP 118/72 (  BP Location: Left Arm, Cuff Size: Normal)   Pulse 98   Temp 98.1 F (36.7 C) (Oral)   Ht 5\' 3"  (1.6 m)   Wt 207 lb (93.9 kg)   SpO2 96%   BMI 36.67 kg/m   SpO2: 96 % RA  Somber amb latina speaks  [place] ok but d appears to struggle to understand what is meant by "when is it worse vs better"   HEENT: nl dentition, turbinates bilaterally, and oropharynx. Nl external ear canals without cough reflex   NECK :  without JVD/Nodes/TM/ nl carotid upstrokes bilaterally   LUNGS: no acc muscle use,  Nl contour chest which is clear to A and P bilaterally with  cough on insp   maneuvers   CV:  RRR   no s3 or murmur or increase in P2, and no edema   ABD:  soft and nontender with nl inspiratory excursion in the supine position. No bruits or organomegaly appreciated, bowel sounds nl  MS:  Nl gait/ ext warm without deformities, calf tenderness, cyanosis or clubbing No obvious joint restrictions   SKIN: warm and dry without lesions    NEURO:  alert, approp, nl sensorium with  no motor or cerebellar deficits apparent.     CXR PA and Lateral:   01/23/2019 :    I personally reviewed images and agree with radiology impression as follows:   No active cardiopulmonary disease.  My review 5 cxr's since 11/12/16 all wnl         Assessment   No problem-specific Assessment & Plan notes found for this encounter.     Christinia Gully, MD 01/23/2019

## 2019-01-24 ENCOUNTER — Encounter: Payer: Self-pay | Admitting: Internal Medicine

## 2019-01-24 DIAGNOSIS — R0609 Other forms of dyspnea: Secondary | ICD-10-CM | POA: Insufficient documentation

## 2019-01-24 NOTE — Assessment & Plan Note (Signed)
01/23/2019   Walked RA  2 laps @  approx 231ft each @ fast  pace  stopped due to  End of study, no sob or cough   Suspect deconditioning/ absence of variability or noct symptoms strongly against asthma   Total time devoted to counseling  > 50 % of initial 30 min office visit:  review case with pt/ directly observed portions of ambulatory 02 saturation study/  discussion of options/alternatives/ personally creating written customized instructions  in presence of pt  then going over those specific  Instructions directly with the pt including how to use all of the meds but in particular covering each new medication in detail and the difference between the maintenance= "automatic" meds and the prns using an action plan format for the latter (If this problem/symptom => do that organization reading Left to right).  Please see AVS from this visit for a full list of these instructions which I personally wrote for this pt and  are unique to this visit.

## 2019-01-24 NOTE — Progress Notes (Signed)
Spoke with pt and notified of results per Dr. Wert. Pt verbalized understanding and denied any questions. 

## 2019-01-24 NOTE — Assessment & Plan Note (Signed)
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),  is so named because it's frequently impossible to sort out how much is  CR/sinusitis with freq throat clearing (which can be related to primary GERD)   vs  causing  secondary (" extra esophageal")  GERD from wide swings in gastric pressure that occur with throat clearing, often  promoting self use of mint and menthol lozenges that reduce the lower esophageal sphincter tone and exacerbate the problem further in a cyclical fashion (see below)   These are the same pts (now being labeled as having "irritable larynx syndrome" by some cough centers) who not infrequently have a history of having failed to tolerate ace inhibitors,  dry powder inhalers or biphosphonates or report having atypical/extraesophageal reflux symptoms that don't respond to standard doses of PPI  and are easily confused as having aecopd or asthma flares by even experienced allergists/ pulmonologists (myself included).   Re cyclical cough: Of the three most common causes of  Sub-acute / recurrent or chronic cough, only one (GERD)  can actually contribute to/ trigger  the other two (asthma and post nasal drip syndrome)  and perpetuate the cylce of cough.  While not intuitively obvious, many patients with chronic low grade reflux do not cough until there is a primary insult that disturbs the protective epithelial barrier and exposes sensitive nerve endings.   This is typically viral but can due to PNDS and  either may apply here.     >>> The point is that once this occurs, it is difficult to eliminate the cycle  using anything but a maximally effective acid suppression regimen at least in the short run, accompanied by an appropriate diet to address non acid GERD   If not improving will need a more formal cough eval but has not funds and I had a great deal of difficulty with getting an accurate hx (? Cultural/language/depression?) so would  like her to see Dr Ronalee Belts first to see if she can get a better handle on her hx/baseline before adding additional empirical trials or additional expensive studies.

## 2019-07-15 ENCOUNTER — Encounter: Payer: Self-pay | Admitting: Emergency Medicine

## 2019-07-15 ENCOUNTER — Ambulatory Visit
Admission: EM | Admit: 2019-07-15 | Discharge: 2019-07-15 | Disposition: A | Discharge status: Home / Self Care | Payer: Self-pay | Attending: Emergency Medicine | Admitting: Emergency Medicine

## 2019-07-15 ENCOUNTER — Other Ambulatory Visit: Payer: Self-pay

## 2019-07-15 DIAGNOSIS — J209 Acute bronchitis, unspecified: Secondary | ICD-10-CM

## 2019-07-15 DIAGNOSIS — J302 Other seasonal allergic rhinitis: Secondary | ICD-10-CM

## 2019-07-15 MED ORDER — MONTELUKAST SODIUM 10 MG PO TABS: 10.0000 mg | ORAL_TABLET | Freq: Every day | ORAL | 0 refills | Status: DC

## 2019-07-15 MED ORDER — PREDNISONE 20 MG PO TABS: 20.0000 mg | ORAL_TABLET | Freq: Every day | ORAL | 0 refills | Status: DC

## 2019-07-15 MED ORDER — BENZONATATE 100 MG PO CAPS: 100.0000 mg | ORAL_CAPSULE | Freq: Three times a day (TID) | ORAL | 0 refills | Status: DC

## 2019-07-15 MED ORDER — LORATADINE 10 MG PO TABS: 10.0000 mg | ORAL_TABLET | Freq: Every day | ORAL | 0 refills | Status: DC

## 2019-07-15 MED ORDER — ALBUTEROL SULFATE HFA 108 (90 BASE) MCG/ACT IN AERS: 1.0000 | INHALATION_SPRAY | Freq: Four times a day (QID) | RESPIRATORY_TRACT | 0 refills | Status: DC | PRN

## 2019-07-15 NOTE — Discharge Instructions (Signed)

## 2019-07-15 NOTE — ED Provider Notes (Signed)
[name]    CSN: [id number] Arrival date & time: 07/15/19  1520      History   Chief Complaint Chief Complaint  Patient presents with  . Cough    HPI Victoria Schneider is a 39 y.o. female with history of GERD, allergies presenting for 3-week course of nasal congestion and intermittent cough.  Patient states that she has had increased sputum production without hemoptysis or shortness of breath.  Patient states she has been seen for this before, most recently in [place] on 06/24/2019; states she was told "it's sinusitis".  Patient brings photos of prescriptions written at that time, "montaclar" the states she was unable to fill those due to coming to the states the next day.  Patient denies known Covid contact, productive cough, fever, shortness of breath.  No nausea, vomiting, chest pain.  Has not currently tried anything for symptoms.  Past Medical History:  Diagnosis Date  . GERD (gastroesophageal reflux disease)     Patient Active Problem List   Diagnosis Date Noted  . DOE (dyspnea on exertion) 01/24/2019  . Upper airway cough syndrome 01/23/2019    Past Surgical History:  Procedure Laterality Date  . NO PAST SURGERIES      OB History    Gravida  4   Para  4   Term  4   Preterm      AB      Living  4     SAB      TAB      Ectopic      Multiple  0   Live Births  4            Home Medications    Prior to Admission medications   Medication Sig Start Date End Date Taking? Authorizing Provider  albuterol (VENTOLIN HFA) 108 (90 Base) MCG/ACT inhaler Inhale 1-2 puffs into the lungs every 6 (six) hours as needed for wheezing or shortness of breath. 07/15/19   Hall-Potvin, [place], PA-C  benzonatate (TESSALON) 100 MG capsule Take 1 capsule (100 mg total) by mouth every 8 (eight) hours. 07/15/19   Hall-Potvin, [place], PA-C  famotidine (PEPCID) 20 MG tablet One after supper 01/23/19   Nyoka Cowden, MD  loratadine (CLARITIN) 10 MG  tablet Take 1 tablet (10 mg total) by mouth daily. 07/15/19   Hall-Potvin, [place], PA-C  montelukast (SINGULAIR) 10 MG tablet Take 1 tablet (10 mg total) by mouth at bedtime. 07/15/19   Hall-Potvin, [place], PA-C  pantoprazole (PROTONIX) 40 MG tablet Take 1 tablet (40 mg total) by mouth daily. Take 30-60 min before first meal of the day 01/23/19   Nyoka Cowden, MD  predniSONE (DELTASONE) 20 MG tablet Take 1 tablet (20 mg total) by mouth daily. 07/15/19   Hall-Potvin, [place], PA-C  Vitamin D, Ergocalciferol, (DRISDOL) 1.25 MG (50000 UT) CAPS capsule Take 1 capsule by mouth once a week for 3 months. 06/06/18   [provider]    Family History Family History  Problem Relation Age of Onset  . Diabetes Father   . Hypertension Father   . Diabetes Brother   . Hypertension Mother   . Depression Daughter     Social History Social History   Tobacco Use  . Smoking status: Never Smoker  . Smokeless tobacco: Never Used  Substance Use Topics  . Alcohol use: Not Currently    Comment: rarely  . Drug use: No     Allergies   Penicillins   Review  of Systems As per HPI   Physical Exam Triage Vital Signs ED Triage Vitals [07/15/19 1530]  Enc Vitals Group     BP 128/69     Pulse Rate 89     Resp 16     Temp 97.7 F (36.5 C)     Temp Source Temporal     SpO2 98 %     Weight      Height      Head Circumference      Peak Flow      Pain Score 6     Pain Loc      Pain Edu?      Excl. in [place]?    No data found.  Updated Vital Signs BP 128/69   Pulse 89   Temp 97.7 F (36.5 C) (Temporal)   Resp 16   LMP 07/01/2019   SpO2 98%   Visual Acuity Right Eye Distance:   Left Eye Distance:   Bilateral Distance:    Right Eye Near:   Left Eye Near:    Bilateral Near:     Physical Exam Constitutional:      General: She is not in acute distress.    Appearance: She is obese. She is not ill-appearing.  HENT:     Head: Normocephalic and atraumatic.     Jaw: There is  normal jaw occlusion. No tenderness or pain on movement.     Right Ear: Hearing, tympanic membrane, ear canal and external ear normal. No tenderness. No mastoid tenderness.     Left Ear: Hearing, tympanic membrane, ear canal and external ear normal. No tenderness. No mastoid tenderness.     Nose: No nasal deformity, septal deviation or nasal tenderness.     Right Turbinates: Not swollen or pale.     Left Turbinates: Not swollen or pale.     Right Sinus: No maxillary sinus tenderness or frontal sinus tenderness.     Left Sinus: No maxillary sinus tenderness or frontal sinus tenderness.     Comments: Negative sinus tenderness bilaterally.  Turbinates are bilaterally edematous with mucosal pallor.    Mouth/Throat:     Lips: Pink. No lesions.     Mouth: Mucous membranes are moist. No injury.     Pharynx: Oropharynx is clear. Uvula midline. No posterior oropharyngeal erythema or uvula swelling.     Comments: no tonsillar exudate or hypertrophy Eyes:     General: No scleral icterus.    Conjunctiva/sclera: Conjunctivae normal.     Pupils: Pupils are equal, round, and reactive to light.  Cardiovascular:     Rate and Rhythm: Normal rate and regular rhythm.  Pulmonary:     Effort: Pulmonary effort is normal. No respiratory distress.     Breath sounds: No wheezing or rales.  Musculoskeletal:     Cervical back: Normal range of motion and neck supple. No tenderness. No muscular tenderness.  Lymphadenopathy:     Cervical: No cervical adenopathy.  Skin:    General: Skin is warm.     Capillary Refill: Capillary refill takes less than 2 seconds.     Findings: No rash.  Neurological:     General: No focal deficit present.     Mental Status: She is alert and oriented to person, place, and time.      UC Treatments / Results  Labs (all labs ordered are listed, but only abnormal results are displayed) Labs Reviewed - No data to display  EKG   Radiology No results found.  Procedures  Procedures (including critical care time)  Medications Ordered in UC Medications - No data to display  Initial Impression / Assessment and Plan / UC Course  I have reviewed the triage vital signs and the nursing notes.  Pertinent labs & imaging results that were available during my care of the patient were reviewed by me and considered in my medical decision making (see chart for details).     Patient afebrile, nontoxic in office today.  Likely seasonal allergies which has contributed to acute bronchitis.  Will treat supportively as outlined below.  Will provide Singulair and Claritin as patient has been prescribed and tolerated this well in the past.  Will defer Covid testing at this time given patient is greater than 4 weeks from symptom onset and travel time.  Return precautions discussed, patient verbalized understanding and is agreeable to plan. Final Clinical Impressions(s) / UC Diagnoses   Final diagnoses:  Seasonal allergies  Acute bronchitis, unspecified organism     Discharge Instructions     Tessalon for cough. Start flonase, atrovent nasal spray for nasal congestion/drainage. You can use over the counter nasal saline rinse such as neti pot for nasal congestion. Keep hydrated, your urine should be clear to pale yellow in color. Tylenol/motrin for fever and pain. Monitor for any worsening of symptoms, chest pain, shortness of breath, wheezing, swelling of the throat, go to the emergency department for further evaluation needed.     ED Prescriptions    Medication Sig Dispense Auth. Provider   albuterol (VENTOLIN HFA) 108 (90 Base) MCG/ACT inhaler Inhale 1-2 puffs into the lungs every 6 (six) hours as needed for wheezing or shortness of breath. 18 g Hall-Potvin, [place], PA-C   montelukast (SINGULAIR) 10 MG tablet Take 1 tablet (10 mg total) by mouth at bedtime. 14 tablet Hall-Potvin, [place], PA-C   loratadine (CLARITIN) 10 MG tablet Take 1 tablet (10 mg total) by mouth  daily. 14 tablet Hall-Potvin, [place], PA-C   predniSONE (DELTASONE) 20 MG tablet Take 1 tablet (20 mg total) by mouth daily. 5 tablet Hall-Potvin, [place], PA-C   benzonatate (TESSALON) 100 MG capsule Take 1 capsule (100 mg total) by mouth every 8 (eight) hours. 21 capsule Hall-Potvin, [place], PA-C     PDMP not reviewed this encounter.   Neldon Mc Dale, [place] 07/15/19 1919

## 2019-07-15 NOTE — ED Notes (Signed)
Patient able to ambulate independently  

## 2019-07-15 NOTE — ED Triage Notes (Addendum)
Pt presents to [facility] for assessment of cough x 3 weeks.  States she has been seen for this in the past and told it was sinusitis.  States she gets this multiple times a year, especially when it gets cold.  When presented with COVID testing options, patient states "it's not COVID, I've been coughing for too long".  Patient also adds bilateral ear pain, nasal congestion, and back pain.

## 2019-08-06 ENCOUNTER — Encounter: Payer: Self-pay | Admitting: Family Medicine

## 2019-08-06 ENCOUNTER — Ambulatory Visit: Payer: Self-pay | Attending: Family Medicine | Admitting: Family Medicine

## 2019-08-06 DIAGNOSIS — R058 Other specified cough: Secondary | ICD-10-CM

## 2019-08-06 DIAGNOSIS — R05 Cough: Secondary | ICD-10-CM

## 2019-08-06 MED ORDER — LORATADINE 10 MG PO TABS: 10.0000 mg | ORAL_TABLET | Freq: Every day | ORAL | 0 refills | Status: DC

## 2019-08-06 MED ORDER — MONTELUKAST SODIUM 10 MG PO TABS: 10.0000 mg | ORAL_TABLET | Freq: Every day | ORAL | 0 refills | Status: DC

## 2019-08-06 MED ORDER — PANTOPRAZOLE SODIUM 40 MG PO TBEC: 40.0000 mg | DELAYED_RELEASE_TABLET | Freq: Every day | ORAL | 2 refills | Status: DC

## 2019-08-06 NOTE — Progress Notes (Signed)
Virtual Visit via Telephone Note  I connected with Victoria Schneider. [place] on 08/06/19 at  2:30 PM EST by telephone and verified that I am speaking with the correct person using two identifiers.   I discussed the limitations, risks, security and privacy concerns of performing an evaluation and management service by telephone and the availability of in person appointments. I also discussed with the patient that there may be a patient responsible charge related to this service. The patient expressed understanding and agreed to proceed.  Visit done by telemedicine as patient with complaint of respiratory symptoms/cough and due to current COVID-19 pandemic guidelines for this office, patient was not evaluated in person.  Patient Location: Home Provider Location: [facility]  Others participating in call: initially had a Spanish interpreter but per patient did feel that she needed an interpreter at today's visit therefore interpreter left the call.   History of Present Illness:        39 year old female new to the practice who reports that she has had issues with recurrent cough which is usually worse at night.  Cough is occasionally productive of clear to white sputum but is usually dry. She reports that she was on a medication at 1 time when she lived in [place] that did help with her cough.  She does sometimes feel as if some smells/situations increased with cough.  She has noticed that in winter her cough is worse especially when it is very cold.  She also sometimes has increased cough with exposure to smoke or pollen.  She has occasional sensation of postnasal drainage and nasal congestion.  She denies any current issues with acid reflux, sore throat or difficulty swallowing.  She sometimes feels short of breath when she is walking and will occasionally have to stop walking due to onset of shortness of breath and cough.  No issues currently with chest pain or palpitations, no abdominal pain-no nausea  or vomiting.  No increased thirst or urinary frequency.  Past Medical History:  Diagnosis Date  . GERD (gastroesophageal reflux disease)     Past Surgical History:  Procedure Laterality Date  . NO PAST SURGERIES      Family History  Problem Relation Age of Onset  . Diabetes Father   . Hypertension Father   . Diabetes Brother   . Hypertension Mother   . Depression Daughter     Social History   Tobacco Use  . Smoking status: Never Smoker  . Smokeless tobacco: Never Used  Substance Use Topics  . Alcohol use: Not Currently    Comment: rarely  . Drug use: No     Allergies  Allergen Reactions  . Penicillins Rash and Other (See Comments)    Has patient had a PCN reaction causing immediate rash, facial/tongue/throat swelling, SOB or lightheadedness with hypotension: No Has patient had a PCN reaction causing severe rash involving mucus membranes or skin necrosis: No Has patient had a PCN reaction that required hospitalization No Has patient had a PCN reaction occurring within the last 10 years: Yes If all of the above answers are "NO", then may proceed with Cephalosporin use.        Observations/Objective: No vital signs or physical exam conducted as visit was done via telephone Patient was able to speak clearly and without the need to stop due to shortness of breath or recurrent cough. Did have 1-2 dry sounding coughs during encounter.     Assessment and Plan: 1. Recurrent cough; 2. Upper airway  cough syndrome She is status post a recent visit at Urgent Care at [facility], notes reviewed at today's visit. She reports that she did have some improvement in her cough while on the medications but now that she is out of the medications she has had a recurrence of her cough. She has had a normal CXR in August of 2020 on review of chart and has been seen by a pulmonologist, Dr. Sherene Sires, (which she did not mention when giving details of her cough,  in follow-up of her chronic cough  and diagnosed with upper airway cough syndrome. She also mentions possible prior ENT referral. She is being provided with prescriptions to obtain singulair, loratadine and pantoprazole.  Discussed with patient that issues such as allergic rhinitis or silent acid reflux could cause issues with recurrent cough.  Patient may also need evaluation for possible cough variant asthma if this has not already been done in the past.  He has been asked to restart medications as prescribed per pulmonology consultation but she does not feel that she needs both Pepcid and pantoprazole.  Schedule in-office follow-up in 4 weeks but sooner if any worsening of cough or other concerns.  - montelukast (SINGULAIR) 10 MG tablet; Take 1 tablet (10 mg total) by mouth at bedtime.  Dispense: 14 tablet; Refill: 0 - loratadine (CLARITIN) 10 MG tablet; Take 1 tablet (10 mg total) by mouth daily.  Dispense: 14 tablet; Refill: 0 - pantoprazole (PROTONIX) 40 MG tablet; Take 1 tablet (40 mg total) by mouth daily. Take 30-60 min before first meal of the day  Dispense: 30 tablet; Refill: 2  -Patient declined interpreter however there were some instances in which patient had difficulty describing her symptoms are understanding questions that were asked due to language barrier   Follow Up Instructions:Return in about 4 weeks (around 09/03/2019) for chronic cough- sooner if needed.    I discussed the assessment and treatment plan with the patient. The patient was provided an opportunity to ask questions and all were answered. The patient agreed with the plan and demonstrated an understanding of the instructions.   The patient was advised to call back or seek an in-person evaluation if the symptoms worsen or if the condition fails to improve as anticipated.  I provided 15 minutes of non-face-to-face time during this encounter.   Cain Saupe, MD

## 2019-10-06 ENCOUNTER — Ambulatory Visit
Admission: EM | Admit: 2019-10-06 | Discharge: 2019-10-06 | Disposition: A | Discharge status: Home / Self Care | Payer: Self-pay

## 2019-10-06 ENCOUNTER — Other Ambulatory Visit: Payer: Self-pay

## 2019-10-06 ENCOUNTER — Encounter: Payer: Self-pay | Admitting: Emergency Medicine

## 2019-10-06 DIAGNOSIS — R079 Chest pain, unspecified: Secondary | ICD-10-CM

## 2019-10-06 DIAGNOSIS — R05 Cough: Secondary | ICD-10-CM

## 2019-10-06 DIAGNOSIS — R058 Other specified cough: Secondary | ICD-10-CM

## 2019-10-06 DIAGNOSIS — M549 Dorsalgia, unspecified: Secondary | ICD-10-CM

## 2019-10-06 DIAGNOSIS — M62838 Other muscle spasm: Secondary | ICD-10-CM

## 2019-10-06 MED ORDER — LORATADINE 10 MG PO TABS: 10.0000 mg | ORAL_TABLET | Freq: Every day | ORAL | 0 refills | Status: DC

## 2019-10-06 MED ORDER — CYCLOBENZAPRINE HCL 5 MG PO TABS: 5.0000 mg | ORAL_TABLET | Freq: Two times a day (BID) | ORAL | 0 refills | Status: AC | PRN

## 2019-10-06 MED ORDER — NAPROXEN 500 MG PO TABS: 500.0000 mg | ORAL_TABLET | Freq: Two times a day (BID) | ORAL | 0 refills | Status: DC

## 2019-10-06 NOTE — ED Triage Notes (Signed)
Pain in left chest to the back.  Pain for 7 days, stretching makes pain worse.  No known injury.  Deep inspiration causes pain.

## 2019-10-06 NOTE — Discharge Instructions (Addendum)
Recommend RICE: rest, ice, compression, elevation as needed for pain.   Heat therapy (hot compress, warm wash rag, hot showers, etc.) can help relax muscles and soothe muscle aches. Cold therapy (ice packs) can be used to help swelling both after injury and after prolonged use of areas of chronic pain/aches.  For pain: naproxen  May take muscle relaxer as needed for severe pain / spasm.  (This medication may cause you to become tired so it is important you do not drink alcohol or operate heavy machinery while on this medication.  Recommend your first dose to be taken before bedtime to monitor for side effects safely)  Return for worsening pain, difficulty breathing, dizziness.

## 2019-10-06 NOTE — ED Provider Notes (Signed)
[name]    CSN: [id number] Arrival date & time: 10/06/19  0950      History   Chief Complaint Chief Complaint  Patient presents with  . Back Pain    HPI Victoria Schneider is a 39 y.o. female with history of GERD presenting for 1 week course of left upper chest and back pain.  Describes pain as tight.  Patient states is worse with certain movements and stretching.  Deep inspiration causes pain, though patient denies difficulty breathing.  No cough, palpitations, lightheadedness, nausea, weakness or numbness.  Patient denies history of stroke or heart attack.  No family medical history of young/sudden death, significant cardiopulmonary disease.  Patient denies fall, trauma to the area.   Past Medical History:  Diagnosis Date  . GERD (gastroesophageal reflux disease)     Patient Active Problem List   Diagnosis Date Noted  . DOE (dyspnea on exertion) 01/24/2019  . Upper airway cough syndrome 01/23/2019    Past Surgical History:  Procedure Laterality Date  . NO PAST SURGERIES      OB History    Gravida  4   Para  4   Term  4   Preterm      AB      Living  4     SAB      TAB      Ectopic      Multiple  0   Live Births  4            Home Medications    Prior to Admission medications   Medication Sig Start Date End Date Taking? Authorizing Provider  acetaminophen (TYLENOL) 325 MG tablet Take 650 mg by mouth every 6 (six) hours as needed.   Yes [provider]  albuterol (VENTOLIN HFA) 108 (90 Base) MCG/ACT inhaler Inhale 1-2 puffs into the lungs every 6 (six) hours as needed for wheezing or shortness of breath. Patient not taking: Reported on 08/06/2019 07/15/19   Hall-Potvin, [place], PA-C  benzonatate (TESSALON) 100 MG capsule Take 1 capsule (100 mg total) by mouth every 8 (eight) hours. Patient not taking: Reported on 08/06/2019 07/15/19   Hall-Potvin, [place], PA-C  cyclobenzaprine (FLEXERIL) 5 MG tablet Take 1 tablet  (5 mg total) by mouth 2 (two) times daily as needed for up to 5 days for muscle spasms. 10/06/19 10/11/19  Hall-Potvin, [place], PA-C  famotidine (PEPCID) 20 MG tablet One after supper Patient not taking: Reported on 08/06/2019 01/23/19   Nyoka Cowden, MD  loratadine (CLARITIN) 10 MG tablet Take 1 tablet (10 mg total) by mouth daily. 10/06/19   Hall-Potvin, [place], PA-C  montelukast (SINGULAIR) 10 MG tablet Take 1 tablet (10 mg total) by mouth at bedtime. 08/06/19   Fulp, Cammie, MD  naproxen (NAPROSYN) 500 MG tablet Take 1 tablet (500 mg total) by mouth 2 (two) times daily. 10/06/19   Hall-Potvin, [place], PA-C  pantoprazole (PROTONIX) 40 MG tablet Take 1 tablet (40 mg total) by mouth daily. Take 30-60 min before first meal of the day 08/06/19   Fulp, Cammie, MD  Vitamin D, Ergocalciferol, (DRISDOL) 1.25 MG (50000 UT) CAPS capsule Take 1 capsule by mouth once a week for 3 months. 06/06/18   [provider]    Family History Family History  Problem Relation Age of Onset  . Diabetes Father   . Hypertension Father   . Diabetes Brother   . Hypertension Mother   . Depression Daughter     Social  History Social History   Tobacco Use  . Smoking status: Never Smoker  . Smokeless tobacco: Never Used  Substance Use Topics  . Alcohol use: Not Currently    Comment: rarely  . Drug use: No     Allergies   Penicillins   Review of Systems As per HPI   Physical Exam Triage Vital Signs ED Triage Vitals  Enc Vitals Group     BP      Pulse      Resp      Temp      Temp src      SpO2      Weight      Height      Head Circumference      Peak Flow      Pain Score      Pain Loc      Pain Edu?      Excl. in [place]?    No data found.  Updated Vital Signs BP 107/72 (BP Location: Left Arm) Comment (BP Location): large  Pulse 73   Temp 97.9 F (36.6 C) (Oral)   Resp 18   LMP 09/29/2019   SpO2 97%   Visual Acuity Right Eye Distance:   Left Eye Distance:   Bilateral  Distance:    Right Eye Near:   Left Eye Near:    Bilateral Near:     Physical Exam Constitutional:      General: She is not in acute distress.    Appearance: She is not ill-appearing.  HENT:     Head: Normocephalic and atraumatic.     Right Ear: Tympanic membrane, ear canal and external ear normal.     Left Ear: Tympanic membrane, ear canal and external ear normal.     Mouth/Throat:     Mouth: Mucous membranes are moist.     Pharynx: Oropharynx is clear.  Eyes:     General: No scleral icterus.    Pupils: Pupils are equal, round, and reactive to light.  Neck:     Comments: Positive left neck arch tenderness.  Patient feels pulling sensation when turning head all the way to the right on her left side. Cardiovascular:     Rate and Rhythm: Normal rate and regular rhythm.     Heart sounds: No murmur. No gallop.   Pulmonary:     Effort: Pulmonary effort is normal. No respiratory distress.     Breath sounds: No wheezing or rales.  Musculoskeletal:        General: Tenderness present.     Cervical back: Normal range of motion and neck supple. Tenderness present.     Comments: Patient tender diffusely through left trapezius and left pec insertion.  No crepitus or edema or spasm appreciated  Lymphadenopathy:     Cervical: No cervical adenopathy.  Skin:    General: Skin is warm.     Coloration: Skin is not jaundiced or pale.     Findings: No bruising, erythema or rash.  Neurological:     Mental Status: She is alert and oriented to person, place, and time.      UC Treatments / Results  Labs (all labs ordered are listed, but only abnormal results are displayed) Labs Reviewed - No data to display  EKG   Radiology No results found.  Procedures Procedures (including critical care time)  Medications Ordered in UC Medications - No data to display  Initial Impression / Assessment and Plan / UC Course  I have reviewed  the triage vital signs and the nursing notes.  Pertinent  labs & imaging results that were available during my care of the patient were reviewed by me and considered in my medical decision making (see chart for details).     Patient afebrile, nontoxic, and hemodynamically stable in office today.  Pain is reproducible, primarily over left upper trapezius.  Patient without significant cardiopulmonary history or family history that would increase patient's risk for acute process such as ACS, DVT/PE.  Likely musculoskeletal: We will treat supportively as outlined below. Final Clinical Impressions(s) / UC Diagnoses   Final diagnoses:  Left-sided chest pain  Upper back pain on left side  Muscle spasm     Discharge Instructions     Recommend RICE: rest, ice, compression, elevation as needed for pain.   Heat therapy (hot compress, warm wash rag, hot showers, etc.) can help relax muscles and soothe muscle aches. Cold therapy (ice packs) can be used to help swelling both after injury and after prolonged use of areas of chronic pain/aches.  For pain: naproxen  May take muscle relaxer as needed for severe pain / spasm.  (This medication may cause you to become tired so it is important you do not drink alcohol or operate heavy machinery while on this medication.  Recommend your first dose to be taken before bedtime to monitor for side effects safely)  Return for worsening pain, difficulty breathing, dizziness.    ED Prescriptions    Medication Sig Dispense Auth. Provider   naproxen (NAPROSYN) 500 MG tablet Take 1 tablet (500 mg total) by mouth 2 (two) times daily. 30 tablet Hall-Potvin, [place], PA-C   cyclobenzaprine (FLEXERIL) 5 MG tablet Take 1 tablet (5 mg total) by mouth 2 (two) times daily as needed for up to 5 days for muscle spasms. 10 tablet Hall-Potvin, [place], PA-C   loratadine (CLARITIN) 10 MG tablet Take 1 tablet (10 mg total) by mouth daily. 30 tablet Hall-Potvin, [place], PA-C     I have reviewed the PDMP during this encounter.     Hall-Potvin, [place], [place] 10/06/19 1439

## 2020-02-09 ENCOUNTER — Ambulatory Visit
Admission: EM | Admit: 2020-02-09 | Discharge: 2020-02-09 | Disposition: A | Discharge status: Home / Self Care | Payer: Self-pay | Attending: Emergency Medicine | Admitting: Emergency Medicine

## 2020-02-09 ENCOUNTER — Other Ambulatory Visit: Payer: Self-pay

## 2020-02-09 ENCOUNTER — Encounter: Payer: Self-pay | Admitting: Emergency Medicine

## 2020-02-09 DIAGNOSIS — J3089 Other allergic rhinitis: Secondary | ICD-10-CM

## 2020-02-09 DIAGNOSIS — R05 Cough: Secondary | ICD-10-CM

## 2020-02-09 DIAGNOSIS — Z1152 Encounter for screening for COVID-19: Secondary | ICD-10-CM

## 2020-02-09 DIAGNOSIS — M546 Pain in thoracic spine: Secondary | ICD-10-CM

## 2020-02-09 DIAGNOSIS — R059 Cough, unspecified: Secondary | ICD-10-CM

## 2020-02-09 MED ORDER — CYCLOBENZAPRINE HCL 5 MG PO TABS: 5.0000 mg | ORAL_TABLET | Freq: Two times a day (BID) | ORAL | 0 refills | Status: AC | PRN

## 2020-02-09 MED ORDER — BENZONATATE 100 MG PO CAPS: 100.0000 mg | ORAL_CAPSULE | Freq: Three times a day (TID) | ORAL | 0 refills | Status: DC

## 2020-02-09 NOTE — ED Triage Notes (Signed)
Pt here for cough x2 weeks and back pain

## 2020-02-09 NOTE — ED Provider Notes (Signed)
[name]    CSN: [id number] Arrival date & time: 02/09/20  1614      History   Chief Complaint Chief Complaint  Patient presents with  . Cough  . Back Pain    HPI Victoria Schneider is a 39 y.o. female with history of GERD, seasonal allergies presenting for bilateral thoracic back pain.  States she has had persistent cough over the last 2 weeks: Nonproductive, without shortness of breath or chest pain, fever.  Requesting Covid testing due to increased case count in [place].  No known contacts.  States she went to massage yesterday and feels this is contributing to back pain.  Denies trauma, fall, upper extremity numbness or weakness, cervical pain.  Tried ibuprofen with some relief.   Past Medical History:  Diagnosis Date  . GERD (gastroesophageal reflux disease)     Patient Active Problem List   Diagnosis Date Noted  . DOE (dyspnea on exertion) 01/24/2019  . Upper airway cough syndrome 01/23/2019    Past Surgical History:  Procedure Laterality Date  . NO PAST SURGERIES      OB History    Gravida  4   Para  4   Term  4   Preterm      AB      Living  4     SAB      TAB      Ectopic      Multiple  0   Live Births  4            Home Medications    Prior to Admission medications   Medication Sig Start Date End Date Taking? Authorizing Provider  acetaminophen (TYLENOL) 325 MG tablet Take 650 mg by mouth every 6 (six) hours as needed.    [provider]  benzonatate (TESSALON) 100 MG capsule Take 1 capsule (100 mg total) by mouth every 8 (eight) hours. 02/09/20   Hall-Potvin, [place], PA-C  cyclobenzaprine (FLEXERIL) 5 MG tablet Take 1 tablet (5 mg total) by mouth 2 (two) times daily as needed for up to 7 days for muscle spasms. 02/09/20 02/16/20  Hall-Potvin, [place], PA-C  loratadine (CLARITIN) 10 MG tablet Take 1 tablet (10 mg total) by mouth daily. 10/06/19   Hall-Potvin, [place], PA-C  montelukast (SINGULAIR) 10 MG  tablet Take 1 tablet (10 mg total) by mouth at bedtime. 08/06/19   Fulp, Cammie, MD  naproxen (NAPROSYN) 500 MG tablet Take 1 tablet (500 mg total) by mouth 2 (two) times daily. 10/06/19   Hall-Potvin, [place], PA-C  pantoprazole (PROTONIX) 40 MG tablet Take 1 tablet (40 mg total) by mouth daily. Take 30-60 min before first meal of the day 08/06/19   Fulp, Cammie, MD  Vitamin D, Ergocalciferol, (DRISDOL) 1.25 MG (50000 UT) CAPS capsule Take 1 capsule by mouth once a week for 3 months. 06/06/18   [provider]  albuterol (VENTOLIN HFA) 108 (90 Base) MCG/ACT inhaler Inhale 1-2 puffs into the lungs every 6 (six) hours as needed for wheezing or shortness of breath. Patient not taking: Reported on 08/06/2019 07/15/19 02/09/20  Hall-Potvin, [place], PA-C  famotidine (PEPCID) 20 MG tablet One after supper Patient not taking: Reported on 08/06/2019 01/23/19 02/09/20  Nyoka Cowden, MD    Family History Family History  Problem Relation Age of Onset  . Diabetes Father   . Hypertension Father   . Diabetes Brother   . Hypertension Mother   . Depression Daughter     Social History  Social History   Tobacco Use  . Smoking status: Never Smoker  . Smokeless tobacco: Never Used  Substance Use Topics  . Alcohol use: Not Currently    Comment: rarely  . Drug use: No     Allergies   Penicillins   Review of Systems As per HPI   Physical Exam Triage Vital Signs ED Triage Vitals  Enc Vitals Group     BP 02/09/20 1657 120/71     Pulse Rate 02/09/20 1657 80     Resp 02/09/20 1657 16     Temp 02/09/20 1657 98.3 F (36.8 C)     Temp Source 02/09/20 1657 Oral     SpO2 02/09/20 1657 98 %     Weight --      Height --      Head Circumference --      Peak Flow --      Pain Score 02/09/20 1700 7     Pain Loc --      Pain Edu? --      Excl. in GC? --    No data found.  Updated Vital Signs BP 120/71 (BP Location: Left Arm)   Pulse 80   Temp 98.3 F (36.8 C) (Oral)   Resp 16    SpO2 98%   Visual Acuity Right Eye Distance:   Left Eye Distance:   Bilateral Distance:    Right Eye Near:   Left Eye Near:    Bilateral Near:     Physical Exam Constitutional:      General: She is not in acute distress.    Appearance: She is obese. She is not ill-appearing or diaphoretic.  HENT:     Head: Normocephalic and atraumatic.     Mouth/Throat:     Mouth: Mucous membranes are moist.     Pharynx: Oropharynx is clear. No oropharyngeal exudate or posterior oropharyngeal erythema.  Eyes:     General: No scleral icterus.    Conjunctiva/sclera: Conjunctivae normal.     Pupils: Pupils are equal, round, and reactive to light.  Neck:     Comments: Trachea midline, negative JVD Cardiovascular:     Rate and Rhythm: Normal rate and regular rhythm.     Heart sounds: No murmur heard.  No gallop.   Pulmonary:     Effort: Pulmonary effort is normal. No respiratory distress.     Breath sounds: No wheezing, rhonchi or rales.  Musculoskeletal:     Cervical back: Neck supple. No tenderness.     Comments: Bilateral thoracic tenderness that spares spinous process, scapula  Lymphadenopathy:     Cervical: No cervical adenopathy.  Skin:    Capillary Refill: Capillary refill takes less than 2 seconds.     Coloration: Skin is not jaundiced or pale.     Findings: No rash.  Neurological:     General: No focal deficit present.     Mental Status: She is alert and oriented to person, place, and time.      UC Treatments / Results  Labs (all labs ordered are listed, but only abnormal results are displayed) Labs Reviewed  NOVEL CORONAVIRUS, NAA    EKG   Radiology No results found.  Procedures Procedures (including critical care time)  Medications Ordered in UC Medications - No data to display  Initial Impression / Assessment and Plan / UC Course  I have reviewed the triage vital signs and the nursing notes.  Pertinent labs & imaging results that were available during my  care of the patient were reviewed by me and considered in my medical decision making (see chart for details).     Patient afebrile, nontoxic, with SpO2 98%.  Covid PCR pending.  Patient to quarantine until results are back.  We will treat supportively as outlined below.  Return precautions discussed, patient verbalized understanding and is agreeable to plan. Final Clinical Impressions(s) / UC Diagnoses   Final diagnoses:  Encounter for screening for COVID-19  Environmental and seasonal allergies  Cough  Acute bilateral thoracic back pain     Discharge Instructions     Your COVID test is pending - it is important to quarantine / isolate at home until your results are back. If you test positive and would like further evaluation for persistent or worsening symptoms, you may schedule an E-visit or virtual (video) visit throughout the [facility] app or website.  PLEASE NOTE: If you develop severe chest pain or shortness of breath please go to the ER or call 9-1-1 for further evaluation --> DO NOT schedule electronic or virtual visits for this. Please call our office for further guidance / recommendations as needed.  For information about the Covid vaccine, please visit [email or web address]    ED Prescriptions    Medication Sig Dispense Auth. Provider   cyclobenzaprine (FLEXERIL) 5 MG tablet Take 1 tablet (5 mg total) by mouth 2 (two) times daily as needed for up to 7 days for muscle spasms. 14 tablet Hall-Potvin, [place], PA-C   benzonatate (TESSALON) 100 MG capsule Take 1 capsule (100 mg total) by mouth every 8 (eight) hours. 21 capsule Hall-Potvin, [place], PA-C     I have reviewed the PDMP during this encounter.   Hall-Potvin, [place], [place] 02/09/20 1725

## 2020-02-09 NOTE — Discharge Instructions (Addendum)
Your COVID test is pending - it is important to quarantine / isolate at home until your results are back. °If you test positive and would like further evaluation for persistent or worsening symptoms, you may schedule an E-visit or virtual (video) visit throughout the [facility] app or website. ° °PLEASE NOTE: If you develop severe chest pain or shortness of breath please go to the ER or call 9-1-1 for further evaluation --> DO NOT schedule electronic or virtual visits for this. °Please call our office for further guidance / recommendations as needed. ° °For information about the Covid vaccine, please visit [email or web address] °

## 2020-02-11 LAB — NOVEL CORONAVIRUS, NAA: SARS-CoV-2, NAA: NOT DETECTED

## 2020-02-11 LAB — SARS-COV-2, NAA 2 DAY TAT

## 2020-04-27 ENCOUNTER — Ambulatory Visit
Admission: EM | Admit: 2020-04-27 | Discharge: 2020-04-27 | Disposition: A | Discharge status: Home / Self Care | Payer: Self-pay | Attending: Emergency Medicine | Admitting: Emergency Medicine

## 2020-04-27 DIAGNOSIS — H60502 Unspecified acute noninfective otitis externa, left ear: Secondary | ICD-10-CM

## 2020-04-27 MED ORDER — NEOMYCIN-POLYMYXIN-HC 3.5-10000-1 OT SOLN: 3.0000 [drp] | Freq: Three times a day (TID) | OTIC | 0 refills | Status: DC

## 2020-04-27 NOTE — Discharge Instructions (Addendum)
Use eardrops as prescribed for the next week. Return for worsening ear pain, swelling, discharge, bleeding, decreased hearing, development of jaw pain/swelling, fever.  Do NOT use Q-tips as these can cause your ear wax to get stuck, the tips may break off and become a foreign body requiring additional medical care, or puncture your eardrum.  Helpful prevention tip: Use a solution of equal parts isopropyl (rubbing) alcohol and white vinegar (acetic acid) in both ears after swimming. 

## 2020-04-27 NOTE — ED Provider Notes (Signed)
[name]    CSN: [id number] Arrival date & time: 04/27/20  1549      History   Chief Complaint Chief Complaint  Patient presents with  . Otalgia    HPI Victoria Schneider is a 39 y.o. female  Presenting for bilateral ear pain (L>R) for 1 week. States her last few days she has noticed her left lymph node swelling. Has taken allergy medications without relief. Denies fever, cough.  Past Medical History:  Diagnosis Date  . GERD (gastroesophageal reflux disease)     Patient Active Problem List   Diagnosis Date Noted  . DOE (dyspnea on exertion) 01/24/2019  . Upper airway cough syndrome 01/23/2019    Past Surgical History:  Procedure Laterality Date  . NO PAST SURGERIES      OB History    Gravida  4   Para  4   Term  4   Preterm      AB      Living  4     SAB      TAB      Ectopic      Multiple  0   Live Births  4            Home Medications    Prior to Admission medications   Medication Sig Start Date End Date Taking? Authorizing Provider  acetaminophen (TYLENOL) 325 MG tablet Take 650 mg by mouth every 6 (six) hours as needed.    [provider]  loratadine (CLARITIN) 10 MG tablet Take 1 tablet (10 mg total) by mouth daily. 10/06/19   Hall-Potvin, [place], PA-C  neomycin-polymyxin-hydrocortisone (CORTISPORIN) OTIC solution Place 3 drops into both ears 3 (three) times daily. 04/27/20   Hall-Potvin, [place], PA-C  pantoprazole (PROTONIX) 40 MG tablet Take 1 tablet (40 mg total) by mouth daily. Take 30-60 min before first meal of the day 08/06/19   Fulp, Cammie, MD  albuterol (VENTOLIN HFA) 108 (90 Base) MCG/ACT inhaler Inhale 1-2 puffs into the lungs every 6 (six) hours as needed for wheezing or shortness of breath. Patient not taking: Reported on 08/06/2019 07/15/19 02/09/20  Hall-Potvin, [place], PA-C  famotidine (PEPCID) 20 MG tablet One after supper Patient not taking: Reported on 08/06/2019 01/23/19 02/09/20  Nyoka Cowden, MD    Family History Family History  Problem Relation Age of Onset  . Diabetes Father   . Hypertension Father   . Diabetes Brother   . Hypertension Mother   . Depression Daughter     Social History Social History   Tobacco Use  . Smoking status: Never Smoker  . Smokeless tobacco: Never Used  Substance Use Topics  . Alcohol use: Not Currently    Comment: rarely  . Drug use: No     Allergies   Penicillins   Review of Systems Review of Systems  Constitutional: Negative for fatigue and fever.  HENT: Positive for ear pain. Negative for ear discharge, sinus pain, sore throat and voice change.   Eyes: Negative for pain, redness and visual disturbance.  Respiratory: Negative for cough and shortness of breath.   Cardiovascular: Negative for chest pain and palpitations.  Gastrointestinal: Negative for abdominal pain, diarrhea and vomiting.  Musculoskeletal: Negative for arthralgias and myalgias.  Skin: Negative for rash and wound.  Neurological: Negative for syncope and headaches.     Physical Exam Triage Vital Signs ED Triage Vitals  Enc Vitals Group     BP 04/27/20 1631 121/78  Pulse Rate 04/27/20 1631 88     Resp 04/27/20 1631 18     Temp 04/27/20 1631 98.4 F (36.9 C)     Temp Source 04/27/20 1631 Oral     SpO2 04/27/20 1631 98 %     Weight --      Height --      Head Circumference --      Peak Flow --      Pain Score 04/27/20 1633 7     Pain Loc --      Pain Edu? --      Excl. in GC? --    No data found.  Updated Vital Signs BP 121/78 (BP Location: Left Arm)   Pulse 88   Temp 98.4 F (36.9 C) (Oral)   Resp 18   LMP 04/06/2020   SpO2 98%   Visual Acuity Right Eye Distance:   Left Eye Distance:   Bilateral Distance:    Right Eye Near:   Left Eye Near:    Bilateral Near:     Physical Exam Constitutional:      General: She is not in acute distress.    Appearance: She is obese. She is not ill-appearing.  HENT:     Head:  Normocephalic and atraumatic.     Jaw: There is normal jaw occlusion. No tenderness or pain on movement.     Right Ear: Hearing, tympanic membrane and external ear normal. No tenderness. No mastoid tenderness.     Left Ear: Hearing and tympanic membrane normal. No tenderness. No mastoid tenderness.     Ears:     Comments: Right EAC with mild erythema. Left ear with tragal tenderness, EAC swelling and erythema, scant discharge.    Nose: No nasal deformity, septal deviation or nasal tenderness.     Right Turbinates: Not swollen or pale.     Left Turbinates: Not swollen or pale.     Right Sinus: No maxillary sinus tenderness or frontal sinus tenderness.     Left Sinus: No maxillary sinus tenderness or frontal sinus tenderness.     Mouth/Throat:     Lips: Pink. No lesions.     Mouth: Mucous membranes are moist. No injury.     Pharynx: Oropharynx is clear. Uvula midline. No posterior oropharyngeal erythema or uvula swelling.     Comments: no tonsillar exudate or hypertrophy Eyes:     General: No scleral icterus.    Conjunctiva/sclera: Conjunctivae normal.     Pupils: Pupils are equal, round, and reactive to light.  Neck:     Comments: Left cervical/submandibular LAD Cardiovascular:     Rate and Rhythm: Normal rate and regular rhythm.  Pulmonary:     Effort: Pulmonary effort is normal. No respiratory distress.     Breath sounds: No wheezing.  Musculoskeletal:     Cervical back: Normal range of motion and neck supple. Tenderness present. No muscular tenderness.  Lymphadenopathy:     Cervical: Cervical adenopathy present.  Skin:    Capillary Refill: Capillary refill takes less than 2 seconds.  Neurological:     Mental Status: She is alert and oriented to person, place, and time.      UC Treatments / Results  Labs (all labs ordered are listed, but only abnormal results are displayed) Labs Reviewed - No data to display  EKG   Radiology No results  found.  Procedures Procedures (including critical care time)  Medications Ordered in UC Medications - No data to display  Initial Impression /  Assessment and Plan / UC Course  I have reviewed the triage vital signs and the nursing notes.  Pertinent labs & imaging results that were available during my care of the patient were reviewed by me and considered in my medical decision making (see chart for details).     Concern for otitis externa: We will treat with Cortisporin as below.  Return precautions discussed, pt verbalized understanding and is agreeable to plan. Final Clinical Impressions(s) / UC Diagnoses   Final diagnoses:  Acute otitis externa of left ear, unspecified type     Discharge Instructions     Use eardrops as prescribed for the next week. Return for worsening ear pain, swelling, discharge, bleeding, decreased hearing, development of jaw pain/swelling, fever.  Do NOT use Q-tips as these can cause your ear wax to get stuck, the tips may break off and become a foreign body requiring additional medical care, or puncture your eardrum.  Helpful prevention tip: Use a solution of equal parts isopropyl (rubbing) alcohol and white vinegar (acetic acid) in both ears after swimming.    ED Prescriptions    Medication Sig Dispense Auth. Provider   neomycin-polymyxin-hydrocortisone (CORTISPORIN) OTIC solution Place 3 drops into both ears 3 (three) times daily. 10 mL Hall-Potvin, [place], PA-C     PDMP not reviewed this encounter.   Hall-Potvin, [place], [place] 04/27/20 1701

## 2020-04-27 NOTE — ED Triage Notes (Signed)
Pt c/o bilateral ear pain with swollen lt tonsil for over a week. States took allergy meds with no relief.

## 2020-05-05 ENCOUNTER — Encounter: Payer: Self-pay | Admitting: Physician Assistant

## 2020-05-05 ENCOUNTER — Other Ambulatory Visit: Payer: Self-pay | Admitting: Physician Assistant

## 2020-05-05 ENCOUNTER — Ambulatory Visit: Payer: Self-pay | Attending: Physician Assistant | Admitting: Physician Assistant

## 2020-05-05 ENCOUNTER — Other Ambulatory Visit: Payer: Self-pay

## 2020-05-05 VITALS — BP 127/78 | HR 101 | Resp 16 | Wt 229.4 lb

## 2020-05-05 DIAGNOSIS — E1165 Type 2 diabetes mellitus with hyperglycemia: Secondary | ICD-10-CM

## 2020-05-05 DIAGNOSIS — E119 Type 2 diabetes mellitus without complications: Secondary | ICD-10-CM

## 2020-05-05 DIAGNOSIS — H6982 Other specified disorders of Eustachian tube, left ear: Secondary | ICD-10-CM

## 2020-05-05 DIAGNOSIS — Z09 Encounter for follow-up examination after completed treatment for conditions other than malignant neoplasm: Secondary | ICD-10-CM

## 2020-05-05 HISTORY — DX: Type 2 diabetes mellitus with hyperglycemia: E11.65

## 2020-05-05 LAB — POCT GLYCOSYLATED HEMOGLOBIN (HGB A1C): HbA1c, POC (controlled diabetic range): 8.1 % — AB (ref 0.0–7.0)

## 2020-05-05 LAB — GLUCOSE, POCT (MANUAL RESULT ENTRY): POC Glucose: 299 mg/dl — AB (ref 70–99)

## 2020-05-05 MED ORDER — FLUTICASONE PROPIONATE 50 MCG/ACT NA SUSP: 2.0000 | Freq: Every day | NASAL | 6 refills | Status: DC

## 2020-05-05 MED ORDER — TRUEPLUS LANCETS 28G MISC: 1.0000 | Freq: Two times a day (BID) | 2 refills | Status: DC

## 2020-05-05 MED ORDER — TRUE METRIX METER W/DEVICE KIT: 1.0000 | PACK | Freq: Two times a day (BID) | 0 refills | Status: AC

## 2020-05-05 MED ORDER — METFORMIN HCL 500 MG PO TABS: 500.0000 mg | ORAL_TABLET | Freq: Two times a day (BID) | ORAL | 3 refills | Status: DC

## 2020-05-05 MED ORDER — TRUE METRIX BLOOD GLUCOSE TEST VI STRP
ORAL_STRIP | 12 refills | Status: DC
  Filled 2020-11-25: qty 100, 30d supply, fill #0

## 2020-05-05 NOTE — Progress Notes (Signed)
Patient ID: Victoria Schneider, female   DOB: 10-16-1980, 39 y.o.   MRN: [id number]   [facility] Victoria Schneider, is a 39 y.o. female  BUL:[id number]  [id number]:[id number]  DOB - 22-Oct-1980  Subjective:  Chief Complaint and HPI: Victoria Schneider is a 39 y.o. female here today to establish care and for a follow up visit after being seen at [facility] 04/27/2020 for ear pain and decreased hearing.  She was prescribed cortisporin otic.  Not helping.  No fever.  Pain and distorted sound for about 2 weeks now.    On chart review, I noted that her blood sugar was last checked more than a year ago and was mildly elevated.  She denies any s/sx.  Her dad and 2 brothers are diabetics  ED/Hospital notes reviewed.   Social History: Family history:  ROS:   Constitutional:  No f/c, No night sweats, No unexplained weight loss. EENT:  No vision changes, No blurry visionRespiratory: No cough, No SOB Cardiac: No CP, no palpitations GI:  No abd pain, No N/V/D. GU: No Urinary s/sx Musculoskeletal: No joint pain Neuro: No headache, no dizziness, no motor weakness.  Skin: No rash Endocrine:  No polydipsia. No polyuria.  Psych: Denies SI/HI  Problem  Type 2 Diabetes Mellitus With Hyperglycemia, Without Long-Term Current Use of Insulin ([facility])    ALLERGIES: Allergies  Allergen Reactions  . Penicillins Rash and Other (See Comments)    Has patient had a PCN reaction causing immediate rash, facial/tongue/throat swelling, SOB or lightheadedness with hypotension: No Has patient had a PCN reaction causing severe rash involving mucus membranes or skin necrosis: No Has patient had a PCN reaction that required hospitalization No Has patient had a PCN reaction occurring within the last 10 years: Yes If all of the above answers are "NO", then may proceed with Cephalosporin use.     PAST MEDICAL HISTORY: Past Medical History:  Diagnosis Date  . GERD (gastroesophageal reflux disease)   . Type 2 diabetes mellitus with  hyperglycemia, without long-term current use of insulin ([place]) 05/05/2020    MEDICATIONS AT HOME: Prior to Admission medications   Medication Sig Start Date End Date Taking? Authorizing Provider  acetaminophen (TYLENOL) 325 MG tablet Take 650 mg by mouth every 6 (six) hours as needed.    [provider]  Blood Glucose Monitoring Suppl (TRUE METRIX METER) w/Device KIT 1 each by Does not apply route in the morning and at bedtime. 05/05/20   Victoria Donovan, PA-C  fluticasone (FLONASE) 50 MCG/ACT nasal spray Place 2 sprays into both nostrils daily. 05/05/20   Victoria Donovan, PA-C  glucose blood (TRUE METRIX BLOOD GLUCOSE TEST) test strip Use as instructed 05/05/20   Victoria Donovan, PA-C  loratadine (CLARITIN) 10 MG tablet Take 1 tablet (10 mg total) by mouth daily. 10/06/19   Hall-Potvin, [place], PA-C  metFORMIN (GLUCOPHAGE) 500 MG tablet Take 1 tablet (500 mg total) by mouth 2 (two) times daily with a meal. 05/05/20   Victoria Schneider, Victoria Bucy, PA-C  pantoprazole (PROTONIX) 40 MG tablet Take 1 tablet (40 mg total) by mouth daily. Take 30-60 min before first meal of the day 08/06/19   Fulp, Cammie, MD  TRUEplus Lancets 28G MISC 1 each by Does not apply route in the morning and at bedtime. 05/05/20   Victoria Donovan, PA-C  albuterol (VENTOLIN HFA) 108 (90 Base) MCG/ACT inhaler Inhale 1-2 puffs into the lungs every 6 (six) hours as needed for wheezing or shortness of breath. Patient  not taking: Reported on 08/06/2019 07/15/19 02/09/20  Hall-Potvin, [place], PA-C  famotidine (PEPCID) 20 MG tablet One after supper Patient not taking: Reported on 08/06/2019 01/23/19 02/09/20  Tanda Rockers, MD     Objective:  EXAM:   Vitals:   05/05/20 1555  BP: 127/78  Pulse: (!) 101  Resp: 16  SpO2: 99%  Weight: 229 lb 6.4 oz (104.1 kg)    General appearance : A&OX3. NAD. Non-toxic-appearing HEENT: Atraumatic and Normocephalic.  PERRLA. EOM intact.  TM full B with L>R. Canal is WNL.  No  AOM Mouth-MMM, post pharynx WNL w/o erythema, No PND. Neck: supple, no JVD. No cervical lymphadenopathy. No thyromegaly Chest/Lungs:  Breathing-non-labored, Good air entry bilaterally, breath sounds normal without rales, rhonchi, or wheezing  CVS: S1 S2 regular, no murmurs, gallops, rubs  Extremities: Bilateral Lower Ext shows no edema, both legs are warm to touch with = pulse throughout Neurology:  CN II-XII grossly intact, Non focal.   Psych:  TP linear. J/I fair-she seems to be in denial when I explain about the diabetes and that it is not related to what she just ate.  . Normal speech. Appropriate eye contact and affect.  Skin:  No Rash  Data Review Lab Results  Component Value Date   HGBA1C 8.1 (A) 05/05/2020     Assessment & Plan   1. Type 2 diabetes mellitus with hyperglycemia, without long-term current use of insulin ([place]) New diagnosis.  patient education at length.  Glucometer ordered.  Check blood sugars fasting and at bedtime and bring in 3 weeks.  Work to eliminate sugar and white carbs.  Start metformin 500 bid - POCT glycosylated hemoglobin (Hb A1C) - Blood Glucose Monitoring Suppl (TRUE METRIX METER) w/Device KIT; 1 each by Does not apply route in the morning and at bedtime.  Dispense: 1 kit; Refill: 0 - glucose blood (TRUE METRIX BLOOD GLUCOSE TEST) test strip; Use as instructed  Dispense: 100 each; Refill: 12 - TRUEplus Lancets 28G MISC; 1 each by Does not apply route in the morning and at bedtime.  Dispense: 100 each; Refill: 2 - Comprehensive metabolic panel - TSH - CBC with Differential/Platelet - metFORMIN (GLUCOPHAGE) 500 MG tablet; Take 1 tablet (500 mg total) by mouth 2 (two) times daily with a meal.  Dispense: 180 tablet; Refill: 3 - POCT glucose (manual entry)  2. Encounter for examination following treatment at hospital  3. Eustachian tube dysfunction, left Will not prescribe steroids due to #1.  mucinex D for 2-3 weeks - fluticasone (FLONASE) 50 MCG/ACT  nasal spray; Place 2 sprays into both nostrils daily.  Dispense: 16 g; Refill: 6 - Ambulatory referral to ENT  4. Newly diagnosed diabetes ([place]) New diagnosis.  patient education at length.  Glucometer ordered.  Check blood sugars fasting and at bedtime and bring in 3 weeks.  Work to eliminate sugar and white carbs.  Start metformin 500 bid.  45 mins face to face spent   Patient have been counseled extensively about nutrition and exercise  Return for 3 weeks with Lurena Joiner for newly diagnosed diabetes and assign PCP in about 10 weeks.  The patient was given clear instructions to go to ER or return to medical center if symptoms don't improve, worsen or new problems develop. The patient verbalized understanding. The patient was told to call to get lab results if they haven't heard anything in the next week.     Freeman Caldron, PA-C [facility] [place], [place]  05/05/2020, 4:31 PM

## 2020-05-05 NOTE — Patient Instructions (Addendum)
Check your blood sugar in the morning before you eat and at bedtime daily and record.  Bring to next visit.  Eliminate sugar, sweet drinks, juices and white carbohydrates.  Drink 80-100 ounces water daily    Diabetes Mellitus and Nutrition, Adult When you have diabetes (diabetes mellitus), it is very important to have healthy eating habits because your blood sugar (glucose) levels are greatly affected by what you eat and drink. Eating healthy foods in the appropriate amounts, at about the same times every day, can help you:  Control your blood glucose.  Lower your risk of heart disease.  Improve your blood pressure.  Reach or maintain a healthy weight. Every person with diabetes is different, and each person has different needs for a meal plan. Your health care provider may recommend that you work with a diet and nutrition specialist (dietitian) to make a meal plan that is best for you. Your meal plan may vary depending on factors such as:  The calories you need.  The medicines you take.  Your weight.  Your blood glucose, blood pressure, and cholesterol levels.  Your activity level.  Other health conditions you have, such as heart or kidney disease. How do carbohydrates affect me? Carbohydrates, also called carbs, affect your blood glucose level more than any other type of food. Eating carbs naturally raises the amount of glucose in your blood. Carb counting is a method for keeping track of how many carbs you eat. Counting carbs is important to keep your blood glucose at a healthy level, especially if you use insulin or take certain oral diabetes medicines. It is important to know how many carbs you can safely have in each meal. This is different for every person. Your dietitian can help you calculate how many carbs you should have at each meal and for each snack. Foods that contain carbs include:  Bread, cereal, rice, pasta, and crackers.  Potatoes and corn.  Peas, beans, and  lentils.  Milk and yogurt.  Fruit and juice.  Desserts, such as cakes, cookies, ice cream, and candy. How does alcohol affect me? Alcohol can cause a sudden decrease in blood glucose (hypoglycemia), especially if you use insulin or take certain oral diabetes medicines. Hypoglycemia can be a life-threatening condition. Symptoms of hypoglycemia (sleepiness, dizziness, and confusion) are similar to symptoms of having too much alcohol. If your health care provider says that alcohol is safe for you, follow these guidelines:  Limit alcohol intake to no more than 1 drink per day for nonpregnant women and 2 drinks per day for men. One drink equals 12 oz of beer, 5 oz of wine, or 1 oz of hard liquor.  Do not drink on an empty stomach.  Keep yourself hydrated with water, diet soda, or unsweetened iced tea.  Keep in mind that regular soda, juice, and other mixers may contain a lot of sugar and must be counted as carbs. What are tips for following this plan?  Reading food labels  Start by checking the serving size on the "Nutrition Facts" label of packaged foods and drinks. The amount of calories, carbs, fats, and other nutrients listed on the label is based on one serving of the item. Many items contain more than one serving per package.  Check the total grams (g) of carbs in one serving. You can calculate the number of servings of carbs in one serving by dividing the total carbs by 15. For example, if a food has 30 g of total carbs, it  would be equal to 2 servings of carbs.  Check the number of grams (g) of saturated and trans fats in one serving. Choose foods that have low or no amount of these fats.  Check the number of milligrams (mg) of salt (sodium) in one serving. Most people should limit total sodium intake to less than 2,300 mg per day.  Always check the nutrition information of foods labeled as "low-fat" or "nonfat". These foods may be higher in added sugar or refined carbs and should  be avoided.  Talk to your dietitian to identify your daily goals for nutrients listed on the label. Shopping  Avoid buying canned, premade, or processed foods. These foods tend to be high in fat, sodium, and added sugar.  Shop around the outside edge of the grocery store. This includes fresh fruits and vegetables, bulk grains, fresh meats, and fresh dairy. Cooking  Use low-heat cooking methods, such as baking, instead of high-heat cooking methods like deep frying.  Cook using healthy oils, such as olive, canola, or sunflower oil.  Avoid cooking with butter, cream, or high-fat meats. Meal planning  Eat meals and snacks regularly, preferably at the same times every day. Avoid going long periods of time without eating.  Eat foods high in fiber, such as fresh fruits, vegetables, beans, and whole grains. Talk to your dietitian about how many servings of carbs you can eat at each meal.  Eat 4-6 ounces (oz) of lean protein each day, such as lean meat, chicken, fish, eggs, or tofu. One oz of lean protein is equal to: ? 1 oz of meat, chicken, or fish. ? 1 egg. ?  cup of tofu.  Eat some foods each day that contain healthy fats, such as avocado, nuts, seeds, and fish. Lifestyle  Check your blood glucose regularly.  Exercise regularly as told by your health care provider. This may include: ? 150 minutes of moderate-intensity or vigorous-intensity exercise each week. This could be brisk walking, biking, or water aerobics. ? Stretching and doing strength exercises, such as yoga or weightlifting, at least 2 times a week.  Take medicines as told by your health care provider.  Do not use any products that contain nicotine or tobacco, such as cigarettes and e-cigarettes. If you need help quitting, ask your health care provider.  Work with a Veterinary surgeon or diabetes educator to identify strategies to manage stress and any emotional and social challenges. Questions to ask a health care  provider  Do I need to meet with a diabetes educator?  Do I need to meet with a dietitian?  What number can I call if I have questions?  When are the best times to check my blood glucose? Where to find more information:  American Diabetes Association: diabetes.org  Academy of Nutrition and Dietetics: www.eatright.[employer] of Diabetes and Digestive and Kidney Diseases (NIH): CarFli[employer].tn Summary  A healthy meal plan will help you control your blood glucose and maintain a healthy lifestyle.  Working with a diet and nutrition specialist (dietitian) can help you make a meal plan that is best for you.  Keep in mind that carbohydrates (carbs) and alcohol have immediate effects on your blood glucose levels. It is important to count carbs and to use alcohol carefully. This information is not intended to replace advice given to you by your health care provider. Make sure you discuss any questions you have with your health care provider. Document Revised: 05/18/2017 Document Reviewed: 07/10/2016 Elsevier Patient Education  2020 [employer].  Eustachian Tube Dysfunction  Get the generic of mucinex D and take as directed on the package for the next 3 weeks  Eustachian tube dysfunction refers to a condition in which a blockage develops in the narrow passage that connects the middle ear to the back of the nose (eustachian tube). The eustachian tube regulates air pressure in the middle ear by letting air move between the ear and nose. It also helps to drain fluid from the middle ear space. Eustachian tube dysfunction can affect one or both ears. When the eustachian tube does not function properly, air pressure, fluid, or both can build up in the middle ear. What are the causes? This condition occurs when the eustachian tube becomes blocked or cannot open normally. Common causes of this condition include:  Ear infections.  Colds and other infections that affect the nose,  mouth, and throat (upper respiratory tract).  Allergies.  Irritation from cigarette smoke.  Irritation from stomach acid coming up into the esophagus (gastroesophageal reflux). The esophagus is the tube that carries food from the mouth to the stomach.  Sudden changes in air pressure, such as from descending in an airplane or scuba diving.  Abnormal growths in the nose or throat, such as: ? Growths that line the nose (nasal polyps). ? Abnormal growth of cells (tumors). ? Enlarged tissue at the back of the throat (adenoids). What increases the risk? You are more likely to develop this condition if:  You smoke.  You are overweight.  You are a child who has: ? Certain birth defects of the mouth, such as cleft palate. ? Large tonsils or adenoids. What are the signs or symptoms? Common symptoms of this condition include:  A feeling of fullness in the ear.  Ear pain.  Clicking or popping noises in the ear.  Ringing in the ear.  Hearing loss.  Loss of balance.  Dizziness. Symptoms may get worse when the air pressure around you changes, such as when you travel to an area of high elevation, fly on an airplane, or go scuba diving. How is this diagnosed? This condition may be diagnosed based on:  Your symptoms.  A physical exam of your ears, nose, and throat.  Tests, such as those that measure: ? The movement of your eardrum (tympanogram). ? Your hearing (audiometry). How is this treated? Treatment depends on the cause and severity of your condition.  In mild cases, you may relieve your symptoms by moving air into your ears. This is called "popping the ears."  In more severe cases, or if you have symptoms of fluid in your ears, treatment may include: ? Medicines to relieve congestion (decongestants). ? Medicines that treat allergies (antihistamines). ? Nasal sprays or ear drops that contain medicines that reduce swelling (steroids). ? A procedure to drain the fluid in  your eardrum (myringotomy). In this procedure, a small tube is placed in the eardrum to:  Drain the fluid.  Restore the air in the middle ear space. ? A procedure to insert a balloon device through the nose to inflate the opening of the eustachian tube (balloon dilation). Follow these instructions at home: Lifestyle  Do not do any of the following until your health care provider approves: ? Travel to high altitudes. ? Fly in airplanes. ? Work in a Estate agent or room. ? Scuba dive.  Do not use any products that contain nicotine or tobacco, such as cigarettes and e-cigarettes. If you need help quitting, ask your health care provider.  Keep  your ears dry. Wear fitted earplugs during showering and bathing. Dry your ears completely after. General instructions  Take over-the-counter and prescription medicines only as told by your health care provider.  Use techniques to help pop your ears as recommended by your health care provider. These may include: ? Chewing gum. ? Yawning. ? Frequent, forceful swallowing. ? Closing your mouth, holding your nose closed, and gently blowing as if you are trying to blow air out of your nose.  Keep all follow-up visits as told by your health care provider. This is important. Contact a health care provider if:  Your symptoms do not go away after treatment.  Your symptoms come back after treatment.  You are unable to pop your ears.  You have: ? A fever. ? Pain in your ear. ? Pain in your head or neck. ? Fluid draining from your ear.  Your hearing suddenly changes.  You become very dizzy.  You lose your balance. Summary  Eustachian tube dysfunction refers to a condition in which a blockage develops in the eustachian tube.  It can be caused by ear infections, allergies, inhaled irritants, or abnormal growths in the nose or throat.  Symptoms include ear pain, hearing loss, or ringing in the ears.  Mild cases are treated with  maneuvers to unblock the ears, such as yawning or ear popping.  Severe cases are treated with medicines. Surgery may also be done (rare). This information is not intended to replace advice given to you by your health care provider. Make sure you discuss any questions you have with your health care provider. Document Revised: 09/25/2017 Document Reviewed: 09/25/2017 Elsevier Patient Education  2020 [employer].

## 2020-05-06 ENCOUNTER — Other Ambulatory Visit: Payer: Self-pay | Admitting: Physician Assistant

## 2020-05-06 DIAGNOSIS — R7989 Other specified abnormal findings of blood chemistry: Secondary | ICD-10-CM

## 2020-05-06 DIAGNOSIS — R748 Abnormal levels of other serum enzymes: Secondary | ICD-10-CM

## 2020-05-06 LAB — CBC WITH DIFFERENTIAL/PLATELET
Basophils Absolute: 0 10*3/uL (ref 0.0–0.2)
Basos: 0 %
EOS (ABSOLUTE): 0.2 10*3/uL (ref 0.0–0.4)
Eos: 2 %
Hematocrit: 37.2 % (ref 34.0–46.6)
Hemoglobin: 11.4 g/dL (ref 11.1–15.9)
Immature Grans (Abs): 0 10*3/uL (ref 0.0–0.1)
Immature Granulocytes: 0 %
Lymphocytes Absolute: 2.9 10*3/uL (ref 0.7–3.1)
Lymphs: 35 %
MCH: 23.7 pg — ABNORMAL LOW (ref 26.6–33.0)
MCHC: 30.6 g/dL — ABNORMAL LOW (ref 31.5–35.7)
MCV: 77 fL — ABNORMAL LOW (ref 79–97)
Monocytes Absolute: 0.6 10*3/uL (ref 0.1–0.9)
Monocytes: 8 %
Neutrophils Absolute: 4.5 10*3/uL (ref 1.4–7.0)
Neutrophils: 55 %
Platelets: 323 10*3/uL (ref 150–450)
RBC: 4.82 x10E6/uL (ref 3.77–5.28)
RDW: 14.5 % (ref 11.7–15.4)
WBC: 8.3 10*3/uL (ref 3.4–10.8)

## 2020-05-06 LAB — TSH: TSH: 1.81 u[IU]/mL (ref 0.450–4.500)

## 2020-05-06 LAB — COMPREHENSIVE METABOLIC PANEL
ALT: 135 IU/L — ABNORMAL HIGH (ref 0–32)
AST: 118 IU/L — ABNORMAL HIGH (ref 0–40)
Albumin/Globulin Ratio: 1.3 (ref 1.2–2.2)
Albumin: 4.1 g/dL (ref 3.8–4.8)
Alkaline Phosphatase: 234 IU/L — ABNORMAL HIGH (ref 44–121)
BUN/Creatinine Ratio: 5 — ABNORMAL LOW (ref 9–23)
BUN: 4 mg/dL — ABNORMAL LOW (ref 6–24)
Bilirubin Total: 0.3 mg/dL (ref 0.0–1.2)
CO2: 21 mmol/L (ref 20–29)
Calcium: 9 mg/dL (ref 8.7–10.2)
Chloride: 102 mmol/L (ref 96–106)
Creatinine, Ser: 0.8 mg/dL (ref 0.57–1.00)
GFR calc Af Amer: 107 mL/min/{1.73_m2} (ref >59)
GFR calc non Af Amer: 93 mL/min/{1.73_m2} (ref >59)
Globulin, Total: 3.2 g/dL (ref 1.5–4.5)
Glucose: 279 mg/dL — ABNORMAL HIGH (ref 65–99)
Potassium: 4.5 mmol/L (ref 3.5–5.2)
Sodium: 136 mmol/L (ref 134–144)
Total Protein: 7.3 g/dL (ref 6.0–8.5)

## 2020-05-26 ENCOUNTER — Other Ambulatory Visit: Payer: Self-pay | Admitting: Family Medicine

## 2020-05-26 ENCOUNTER — Encounter: Payer: Self-pay | Admitting: Pharmacist

## 2020-05-26 ENCOUNTER — Other Ambulatory Visit: Payer: Self-pay

## 2020-05-26 ENCOUNTER — Ambulatory Visit: Payer: Self-pay | Attending: Obstetrics & Gynecology | Admitting: Pharmacist

## 2020-05-26 DIAGNOSIS — E1165 Type 2 diabetes mellitus with hyperglycemia: Secondary | ICD-10-CM

## 2020-05-26 MED ORDER — METFORMIN HCL 500 MG PO TABS: 1000.0000 mg | ORAL_TABLET | Freq: Two times a day (BID) | ORAL | 2 refills | Status: DC

## 2020-05-26 NOTE — Patient Instructions (Signed)
Thank you for coming to see me today. Please do the following:  1. Increase metformin to 2 tablets in the morning and 2 tablets in the evening.  2. Continue checking blood sugars at home.  3. Continue making the lifestyle changes we've discussed together during our visit. Diet and exercise play a significant role in improving your blood sugars.  4. Follow-up with Dr. Delford Field on 12/22.   Hypoglycemia or low blood sugar:   Low blood sugar can happen quickly and may become an emergency if not treated right away.   While this shouldn't happen often, it can be brought upon if you skip a meal or do not eat enough. Also, if your insulin or other diabetes medications are dosed too high, this can cause your blood sugar to go to low.   Warning signs of low blood sugar include: 1. Feeling shaky or dizzy 2. Feeling weak or tired  3. Excessive hunger 4. Feeling anxious or upset  5. Sweating even when you aren't exercising  What to do if I experience low blood sugar? 1. Check your blood sugar with your meter. If lower than 70, proceed to step 2.  2. Treat with 3-4 glucose tablets or 3 packets of regular sugar. If these aren't around, you can try hard candy. Yet another option would be to drink 4 ounces of fruit juice or 6 ounces of REGULAR soda.  3. Re-check your sugar in 15 minutes. If it is still below 70, do what you did in step 2 again. If has come back up, go ahead and eat a snack or small meal at this time.

## 2020-05-26 NOTE — Progress Notes (Signed)
    S:    PCP: not assigned. Has an appointment with Dr. Delford Field upcoming on 06/09/2020.   No chief complaint on file.  Patient arrives in good spirits. Presents for diabetes evaluation, education, and management. Patient was referred and last seen by Marylene Land on 05/05/20. Of note, pt was newly dx with T2DM at that visit with A1c of 8.1. Metformin was started.   Family/Social History:  - FHx: DM, HTN - Tobacco: never smoker  - Alcohol: denies use   Insurance coverage/medication affordability: self pay  Medication adherence reported.   Current diabetes medications include: metformin 500 mg BID Current hypertension medications include: none  Current hyperlipidemia medications include: none   Patient denies hypoglycemic events.  Patient reported dietary habits: Eats 2 meals/day - Overall, decreased amount of tortillas in her diet - Rarely eats beans or rice during the week - Snacks on fruit but limits this to 1 serving daily - Admits to having a sweet tooth; consumes regular sodas occasionally   Patient-reported exercise habits: none    Patient reports nocturia (nighttime urination).  Patient reports neuropathy (nerve pain). Patient reports visual changes. *Of the above, patient reports improvement since starting metformin.  Patient denies self foot exams.     O:  Physical Exam   ROS   Lab Results  Component Value Date   HGBA1C 8.1 (A) 05/05/2020   There were no vitals filed for this visit.  Lipid Panel  No results found for: CHOL, TRIG, HDL, CHOLHDL, VLDL, LDLCALC, LDLDIRECT  Home fasting blood sugars: reports mostly 120s since starting metformin  2 hour post-meal/random blood sugars: reports 180s-low 200s. 1 outlier >300. *These are reported. No meter or log with her today.   Clinical Atherosclerotic Cardiovascular Disease (ASCVD): No  The ASCVD Risk score Denman George DC Jr., et al., 2013) failed to calculate for the following reasons:   Cannot find a previous HDL  lab   Cannot find a previous total cholesterol lab    A/P: Diabetes newly dx currently uncontrolled. Patient is able to verbalize appropriate hypoglycemia management plan. Medication adherence appears appropriate. Control is suboptimal due to dietary indiscretion and physical inactivity. -Increased dose of metformin to 1000 mg BID.   -Extensively discussed pathophysiology of diabetes, recommended lifestyle interventions, dietary effects on blood sugar control -Counseled on s/sx of and management of hypoglycemia -Next A1C anticipated 07/2020.  -Urine microalbumin:SCr ratio  ASCVD risk - primary prevention in patient with diabetes. Needs lipid panel. Recent CMP revealed transaminitis. Angela placed labs to further investigate this. Will need to monitor going forward to determine appropriateness of statin therapy.   - Lipid  HM: flu, PNA-23, and covid vaccine indicated. Pt has PCP follow-up and will decide on flu and PNA vaccine at that appointment in two weeks.   Written patient instructions provided. Total time in face to face counseling 15 minutes.   Follow up PCP Clinic Visit 06/09/2020.    Butch Penny, PharmD, P[place]y [place], CPP Clinical Pharmacist [facility] [phone]

## 2020-05-27 LAB — LIPID PANEL
Chol/HDL Ratio: 3.1 ratio (ref 0.0–4.4)
Cholesterol, Total: 175 mg/dL (ref 100–199)
HDL: 56 mg/dL (ref >39)
LDL Chol Calc (NIH): 102 mg/dL — ABNORMAL HIGH (ref 0–99)
Triglycerides: 93 mg/dL (ref 0–149)
VLDL Cholesterol Cal: 17 mg/dL (ref 5–40)

## 2020-05-27 LAB — MICROALBUMIN / CREATININE URINE RATIO
Creatinine, Urine: 23 mg/dL
Microalb/Creat Ratio: 17 mg/g creat (ref 0–29)
Microalbumin, Urine: 3.8 ug/mL

## 2020-06-09 ENCOUNTER — Encounter: Payer: Self-pay | Admitting: Critical Care Medicine

## 2020-06-09 ENCOUNTER — Other Ambulatory Visit: Payer: Self-pay

## 2020-06-09 ENCOUNTER — Ambulatory Visit: Payer: Self-pay | Attending: Critical Care Medicine | Admitting: Critical Care Medicine

## 2020-06-09 VITALS — BP 109/75 | HR 80 | Ht 63.0 in | Wt 227.0 lb

## 2020-06-09 DIAGNOSIS — E1165 Type 2 diabetes mellitus with hyperglycemia: Secondary | ICD-10-CM

## 2020-06-09 DIAGNOSIS — R7989 Other specified abnormal findings of blood chemistry: Secondary | ICD-10-CM

## 2020-06-09 DIAGNOSIS — Z1159 Encounter for screening for other viral diseases: Secondary | ICD-10-CM

## 2020-06-09 DIAGNOSIS — G4710 Hypersomnia, unspecified: Secondary | ICD-10-CM

## 2020-06-09 HISTORY — DX: Other specified abnormal findings of blood chemistry: R79.89

## 2020-06-09 LAB — GLUCOSE, POCT (MANUAL RESULT ENTRY): POC Glucose: 103 mg/dl — AB (ref 70–99)

## 2020-06-09 MED ORDER — METFORMIN HCL 500 MG PO TABS: 500.0000 mg | ORAL_TABLET | Freq: Two times a day (BID) | ORAL | 2 refills | Status: DC

## 2020-06-09 NOTE — Assessment & Plan Note (Signed)
Hypersomnia snoring with weight gain referral to sleep medicine was made patient will have insurance soon she should be able to afford sleep evaluations and treatments

## 2020-06-09 NOTE — Progress Notes (Signed)
Subjective:    Patient ID: Victoria Schneider, female    DOB: Nov 06, 1980, 39 y.o.   MRN: [id number]  39 y.o.F here to est PCP   T2DM,   Last OV 04/2020 with PA Mclung This patient initially was seen as an entry to the clinic by way of a face-to-face visit in November with the physician assistant McClung at that visit she had ear pain decreased hearing she was diagnosed with eustachian tube malfunction for this the patient received Flonase and this is completely resolved her ear pain  Patient does have a history of type 2 diabetes with hyperglycemia no use of insulin is a new diagnosis that was made at the last visit she was given a glucometer she was started on Metformin 500 mg twice daily  The patient's noted several blood sugars in the 50 and 60 range since being on the Metformin she has not had elevated blood sugars at this point in time.  The patient does not have any other specific complaints at this time with the exception of daytime hypersomnolence and excess snoring and weight gain  Note the patient does have elevated cholesterol but also had elevated liver functions at the last visit.  The patient does not drink alcohol she does not smoke tobacco products  Past Medical History:  Diagnosis Date  . GERD (gastroesophageal reflux disease)   . Type 2 diabetes mellitus with hyperglycemia, without long-term current use of insulin ([place]) 05/05/2020     Family History  Problem Relation Age of Onset  . Diabetes Father   . Hypertension Father   . Diabetes Brother   . Hypertension Mother   . Depression Daughter      Social History   Socioeconomic History  . Marital status: Single    Spouse name: Not on file  . Number of children: Not on file  . Years of education: Not on file  . Highest education level: Not on file  Occupational History  . Not on file  Tobacco Use  . Smoking status: Never Smoker  . Smokeless tobacco: Never Used  Substance and Sexual Activity  . Alcohol  use: Not Currently    Comment: rarely  . Drug use: No  . Sexual activity: Yes    Birth control/protection: None  Other Topics Concern  . Not on file  Social History Narrative  . Not on file   Social Determinants of Health   Financial Resource Strain: Not on file  Food Insecurity: Not on file  Transportation Needs: Not on file  Physical Activity: Not on file  Stress: Not on file  Social Connections: Not on file  Intimate Partner Violence: Not on file     Allergies  Allergen Reactions  . Penicillins Rash and Other (See Comments)    Has patient had a PCN reaction causing immediate rash, facial/tongue/throat swelling, SOB or lightheadedness with hypotension: No Has patient had a PCN reaction causing severe rash involving mucus membranes or skin necrosis: No Has patient had a PCN reaction that required hospitalization No Has patient had a PCN reaction occurring within the last 10 years: Yes If all of the above answers are "NO", then may proceed with Cephalosporin use.      Outpatient Medications Prior to Visit  Medication Sig Dispense Refill  . acetaminophen (TYLENOL) 325 MG tablet Take 650 mg by mouth every 6 (six) hours as needed.    . Blood Glucose Monitoring Suppl (TRUE METRIX METER) w/Device KIT 1 each by Does not  apply route in the morning and at bedtime. 1 kit 0  . fluticasone (FLONASE) 50 MCG/ACT nasal spray Place 2 sprays into both nostrils daily. 16 g 6  . glucose blood (TRUE METRIX BLOOD GLUCOSE TEST) test strip Use as instructed 100 each 12  . loratadine (CLARITIN) 10 MG tablet Take 1 tablet (10 mg total) by mouth daily. 30 tablet 0  . TRUEplus Lancets 28G MISC 1 each by Does not apply route in the morning and at bedtime. 100 each 2  . metFORMIN (GLUCOPHAGE) 500 MG tablet Take 2 tablets (1,000 mg total) by mouth 2 (two) times daily with a meal. 120 tablet 2  . esomeprazole (NEXIUM) 20 MG capsule Take 20 mg by mouth daily at 12 noon.    . pantoprazole (PROTONIX) 40 MG  tablet Take 1 tablet (40 mg total) by mouth daily. Take 30-60 min before first meal of the day (Patient not taking: Reported on 06/09/2020) 30 tablet 2   No facility-administered medications prior to visit.     Review of Systems Constitutional:    weight loss, night sweats,  Fevers, chills, fatigue, lassitude. HEENT:   No headaches,  Difficulty swallowing,  Tooth/dental problems,  Sore throat,                No sneezing, itching, ear ache, nasal congestion, post nasal drip,   CV:  No chest pain,  Orthopnea, PND, swelling in lower extremities, anasarca, dizziness, palpitations  GI  No heartburn, indigestion, abdominal pain, nausea, vomiting, diarrhea, change in bowel habits, loss of appetite  Resp: No shortness of breath with exertion or at rest.  No excess mucus, no productive cough,  No non-productive cough,  No coughing up of blood.  No change in color of mucus.  No wheezing.  No chest wall deformity  Skin: no rash or lesions.  GU: no dysuria, change in color of urine, no urgency or frequency.  No flank pain.  MS:  No joint pain or swelling.  No decreased range of motion.  No back pain.  Psych:  No change in mood or affect. No depression or anxiety.  No memory loss.     Objective:   Physical Exam Vitals:   06/09/20 1042  BP: 109/75  Pulse: 80  SpO2: 98%  Weight: 227 lb (103 kg)  Height: 5' 3"  (1.6 m)    Gen: Pleasant, obese, in no distress,  normal affect  ENT: No lesions,  mouth clear,  oropharynx clear, no postnasal drip  Neck: No JVD, no TMG, no carotid bruits  Lungs: No use of accessory muscles, no dullness to percussion, clear without rales or rhonchi  Cardiovascular: RRR, heart sounds normal, no murmur or gallops, no peripheral edema  Abdomen: soft and NT, no HSM,  BS normal  Musculoskeletal: No deformities, no cyanosis or clubbing  Neuro: alert, non focal  Skin: Warm, no lesions or rashes Foot exam was completely normal No results found.         Assessment & Plan:  I personally reviewed all images and lab data in the [facility] system as well as any outside material available during this office visit and agree with the  radiology impressions.   Type 2 diabetes mellitus with hyperglycemia, without long-term current use of insulin (HCC) Type 2 diabetes likely too tightly controlled  Plan for this patient will be to reduce Metformin to 500 mg bid   Follow-up metabolic profile follow-up lipid panel  Elevated LFTs Suspect due to fatty liver before starting  atorvastatin follow-up liver profile  Dietary counseling given  Hypersomnia Hypersomnia snoring with weight gain referral to sleep medicine was made patient will have insurance soon she should be able to afford sleep evaluations and treatments   Atoya was seen today for diabetes.  Diagnoses and all orders for this visit:  Type 2 diabetes mellitus with hyperglycemia, without long-term current use of insulin (HCC) -     POCT glucose (manual entry) -     metFORMIN (GLUCOPHAGE) 500 MG tablet; Take 1 tablet (500 mg total) by mouth 2 (two) times daily with a meal.  Elevated LFTs -     Hepatic function panel  Need for hepatitis C screening test -     HCV Ab w Reflex to Quant PCR  Hypersomnia -     Ambulatory referral to Pulmonology   I spent 10 minutes going over the importance for her to receive a Covid vaccine at the end of the discussion I believe she may well proceed I did give her information as to how to pursue this  Hepatitis C assay will be performed Pap smear appointment will be made she was recommended to receive an eye exam and eye resources given  The patient declined to receive the flu vaccine

## 2020-06-09 NOTE — Assessment & Plan Note (Addendum)
Type 2 diabetes likely too tightly controlled  Plan for this patient will be to reduce Metformin to 500 mg bid   Follow-up metabolic profile follow-up lipid panel

## 2020-06-09 NOTE — Assessment & Plan Note (Signed)
Suspect due to fatty liver before starting atorvastatin follow-up liver profile  Dietary counseling given

## 2020-06-09 NOTE — Patient Instructions (Addendum)
Please obtain an eye exam see list of ophthalmology resources  Reduce Metformin to 500 mg twice daily  Rechecking liver function test today along with a hepatitis C screen to see if your liver function has gone down this is likely due to a fatty liver condition, please avoid alcohol  If the liver function is improving we will start a cholesterol pill but not until we see the results of the liver function this visit  Please obtain a Covid vaccine below is information where you can receive it COVID-19 Vaccine Information can be found at: [email or web address] For questions related to vaccine distribution or appointments, please email [email or web address] or call [phone].   Please stay on the Flonase and Claritin daily  A referral to a sleep medicine physician will be made because of the sleep disturbance condition you have and daytime fatigue  Pneumovax was given  Return to see Dr. Delford Field in 2 months       Food Choices for Gastroesophageal Reflux Disease, Adult When you have gastroesophageal reflux disease (GERD), the foods you eat and your eating habits are very important. Choosing the right foods can help ease your discomfort. Think about working with a nutrition specialist (dietitian) to help you make good choices. What are tips for following this plan?  Meals  Choose healthy foods that are low in fat, such as fruits, vegetables, whole grains, low-fat dairy products, and lean meat, fish, and poultry.  Eat small meals often instead of 3 large meals a day. Eat your meals slowly, and in a place where you are relaxed. Avoid bending over or lying down until 2-3 hours after eating.  Avoid eating meals 2-3 hours before bed.  Avoid drinking a lot of liquid with meals.  Cook foods using methods other than frying. Bake, grill, or broil food instead.  Avoid or limit: ? Chocolate. ? Peppermint or  spearmint. ? Alcohol. ? Pepper. ? Black and decaffeinated coffee. ? Black and decaffeinated tea. ? Bubbly (carbonated) soft drinks. ? Caffeinated energy drinks and soft drinks.  Limit high-fat foods such as: ? Fatty meat or fried foods. ? Whole milk, cream, butter, or ice cream. ? Nuts and nut butters. ? Pastries, donuts, and sweets made with butter or shortening.  Avoid foods that cause symptoms. These foods may be different for everyone. Common foods that cause symptoms include: ? Tomatoes. ? Oranges, lemons, and limes. ? Peppers. ? Spicy food. ? Onions and garlic. ? Vinegar. Lifestyle  Maintain a healthy weight. Ask your doctor what weight is healthy for you. If you need to lose weight, work with your doctor to do so safely.  Exercise for at least 30 minutes for 5 or more days each week, or as told by your doctor.  Wear loose-fitting clothes.  Do not smoke. If you need help quitting, ask your doctor.  Sleep with the head of your bed higher than your feet. Use a wedge under the mattress or blocks under the bed frame to raise the head of the bed. Summary  When you have gastroesophageal reflux disease (GERD), food and lifestyle choices are very important in easing your symptoms.  Eat small meals often instead of 3 large meals a day. Eat your meals slowly, and in a place where you are relaxed.  Limit high-fat foods such as fatty meat or fried foods.  Avoid bending over or lying down until 2-3 hours after eating.  Avoid peppermint and spearmint, caffeine, alcohol, and chocolate. This information is not intended to replace advice  given to you by your health care provider. Make sure you discuss any questions you have with your health care provider. Document Revised: 09/26/2018 Document Reviewed: 07/11/2016 Elsevier Patient Education  [place].

## 2020-06-09 NOTE — Progress Notes (Signed)
Pt is here to re est care. Has swelling and pain in right hand and can not bend right ring finger.

## 2020-06-10 ENCOUNTER — Telehealth: Payer: Self-pay

## 2020-06-10 LAB — HEPATIC FUNCTION PANEL
ALT: 66 IU/L — ABNORMAL HIGH (ref 0–32)
AST: 55 IU/L — ABNORMAL HIGH (ref 0–40)
Albumin: 4.3 g/dL (ref 3.8–4.8)
Alkaline Phosphatase: 147 IU/L — ABNORMAL HIGH (ref 44–121)
Bilirubin Total: 0.6 mg/dL (ref 0.0–1.2)
Bilirubin, Direct: 0.16 mg/dL (ref 0.00–0.40)
Total Protein: 7.4 g/dL (ref 6.0–8.5)

## 2020-06-10 LAB — HCV AB W REFLEX TO QUANT PCR: HCV Ab: <0.1 s/co ratio (ref 0.0–0.9)

## 2020-06-10 LAB — HCV INTERPRETATION

## 2020-06-10 NOTE — Telephone Encounter (Signed)
-----   Message from Storm Frisk, MD sent at 06/10/2020  7:03 AM EST ----- Let the pt know liver function still elevated but not as much as before.  Please to avoid alcohol, focus on weight loss and healthy diet,  for the cholesterol I recommend she take one to two fish oil omega 3 supplements daily and will hold off on a cholesterol pill prescription.     Hep C is NEG

## 2020-06-10 NOTE — Telephone Encounter (Signed)
Pt was called and informed of lab results via voicemail. 

## 2020-08-08 ENCOUNTER — Ambulatory Visit
Admission: EM | Admit: 2020-08-08 | Discharge: 2020-08-08 | Disposition: A | Discharge status: Home / Self Care | Payer: 59 | Attending: Emergency Medicine | Admitting: Emergency Medicine

## 2020-08-08 ENCOUNTER — Other Ambulatory Visit: Payer: Self-pay

## 2020-08-08 ENCOUNTER — Encounter: Payer: Self-pay | Admitting: Emergency Medicine

## 2020-08-08 DIAGNOSIS — J039 Acute tonsillitis, unspecified: Secondary | ICD-10-CM

## 2020-08-08 DIAGNOSIS — Z20822 Contact with and (suspected) exposure to covid-19: Secondary | ICD-10-CM | POA: Diagnosis not present

## 2020-08-08 MED ORDER — AMOXICILLIN-POT CLAVULANATE 875-125 MG PO TABS: 1.0000 | ORAL_TABLET | Freq: Two times a day (BID) | ORAL | 0 refills | Status: AC

## 2020-08-08 MED ORDER — IBUPROFEN 800 MG PO TABS: 800.0000 mg | ORAL_TABLET | Freq: Three times a day (TID) | ORAL | 0 refills | Status: DC

## 2020-08-08 NOTE — ED Provider Notes (Signed)
[name]    CSN: [id number] Arrival date & time: 08/08/20  0946      History   Chief Complaint Chief Complaint  Patient presents with  . Sore Throat    HPI Victoria Schneider is a 40 y.o. female history of GERD, DM type II presenting today for evaluation of tonsillar swelling ear pain and cough.  Reports symptoms began 3 weeks ago.  Reports noted green pus in the morning.  Denies any cough.  Denies fevers chills or body aches.  HPI  Past Medical History:  Diagnosis Date  . GERD (gastroesophageal reflux disease)   . Type 2 diabetes mellitus with hyperglycemia, without long-term current use of insulin ([place]) 05/05/2020    Patient Active Problem List   Diagnosis Date Noted  . Elevated LFTs 06/09/2020  . Hypersomnia 06/09/2020  . Type 2 diabetes mellitus with hyperglycemia, without long-term current use of insulin ([place]) 05/05/2020    Past Surgical History:  Procedure Laterality Date  . NO PAST SURGERIES      OB History    Gravida  4   Para  4   Term  4   Preterm      AB      Living  4     SAB      IAB      Ectopic      Multiple  0   Live Births  4            Home Medications    Prior to Admission medications   Medication Sig Start Date End Date Taking? Authorizing Provider  amoxicillin-clavulanate (AUGMENTIN) 875-125 MG tablet Take 1 tablet by mouth every 12 (twelve) hours for 10 days. 08/08/20 08/18/20 Yes Alassane Kalafut C, PA-C  ibuprofen (ADVIL) 800 MG tablet Take 1 tablet (800 mg total) by mouth 3 (three) times daily. 08/08/20  Yes Eldora Napp C, PA-C  acetaminophen (TYLENOL) 325 MG tablet Take 650 mg by mouth every 6 (six) hours as needed.    [provider]  Blood Glucose Monitoring Suppl (TRUE METRIX METER) w/Device KIT 1 each by Does not apply route in the morning and at bedtime. 05/05/20   Argentina Donovan, PA-C  esomeprazole (NEXIUM) 20 MG capsule Take 20 mg by mouth daily at 12 noon.    [provider]  fluticasone (FLONASE) 50 MCG/ACT nasal spray Place 2 sprays into both nostrils daily. 05/05/20   Argentina Donovan, PA-C  glucose blood (TRUE METRIX BLOOD GLUCOSE TEST) test strip Use as instructed 05/05/20   Argentina Donovan, PA-C  loratadine (CLARITIN) 10 MG tablet Take 1 tablet (10 mg total) by mouth daily. 10/06/19   Hall-Potvin, [place], PA-C  metFORMIN (GLUCOPHAGE) 500 MG tablet Take 1 tablet (500 mg total) by mouth 2 (two) times daily with a meal. 06/09/20   Elsie Stain, MD  TRUEplus Lancets 28G MISC 1 each by Does not apply route in the morning and at bedtime. 05/05/20   Argentina Donovan, PA-C  albuterol (VENTOLIN HFA) 108 (90 Base) MCG/ACT inhaler Inhale 1-2 puffs into the lungs every 6 (six) hours as needed for wheezing or shortness of breath. Patient not taking: Reported on 08/06/2019 07/15/19 02/09/20  Hall-Potvin, [place], PA-C  famotidine (PEPCID) 20 MG tablet One after supper Patient not taking: Reported on 08/06/2019 01/23/19 02/09/20  Tanda Rockers, MD    Family History Family History  Problem Relation Age of Onset  . Diabetes Father   . Hypertension Father   .  Diabetes Brother   . Hypertension Mother   . Depression Daughter     Social History Social History   Tobacco Use  . Smoking status: Never Smoker  . Smokeless tobacco: Never Used  Substance Use Topics  . Alcohol use: Not Currently    Comment: rarely  . Drug use: No     Allergies   Penicillins   Review of Systems Review of Systems  Constitutional: Negative for activity change, appetite change, chills, fatigue and fever.  HENT: Positive for congestion, ear pain and sore throat. Negative for rhinorrhea, sinus pressure and trouble swallowing.   Eyes: Negative for discharge and redness.  Respiratory: Negative for cough, chest tightness and shortness of breath.   Cardiovascular: Negative for chest pain.  Gastrointestinal: Negative for abdominal pain, diarrhea, nausea and vomiting.   Musculoskeletal: Negative for myalgias.  Skin: Negative for rash.  Neurological: Negative for dizziness, light-headedness and headaches.     Physical Exam Triage Vital Signs ED Triage Vitals  Enc Vitals Group     BP 08/08/20 1033 134/79     Pulse Rate 08/08/20 1033 91     Resp 08/08/20 1033 18     Temp 08/08/20 1033 97.9 F (36.6 C)     Temp Source 08/08/20 1033 Oral     SpO2 08/08/20 1033 98 %     Weight --      Height --      Head Circumference --      Peak Flow --      Pain Score 08/08/20 1034 5     Pain Loc --      Pain Edu? --      Excl. in [place]? --    No data found.  Updated Vital Signs BP 134/79 (BP Location: Left Arm)   Pulse 91   Temp 97.9 F (36.6 C) (Oral)   Resp 18   SpO2 98%   Visual Acuity Right Eye Distance:   Left Eye Distance:   Bilateral Distance:    Right Eye Near:   Left Eye Near:    Bilateral Near:     Physical Exam Vitals and nursing note reviewed.  Constitutional:      Appearance: She is well-developed and well-nourished.     Comments: No acute distress  HENT:     Head: Normocephalic and atraumatic.     Ears:     Comments: Bilateral ears without tenderness to palpation of external auricle, tragus and mastoid, EAC's without erythema or swelling, TM's with good bony landmarks and cone of light. Non erythematous.     Nose: Nose normal.     Mouth/Throat:     Comments: Oral mucosa pink and moist, bilateral tonsils appear enlarged, but without significant erythema or exudate, posterior pharynx patent and nonerythematous, no uvula deviation or swelling. Normal phonation. Eyes:     Conjunctiva/sclera: Conjunctivae normal.  Neck:     Comments: Tonsillar lymphadenopathy, tender on left Cardiovascular:     Rate and Rhythm: Normal rate.  Pulmonary:     Effort: Pulmonary effort is normal. No respiratory distress.     Comments: Breathing comfortably at rest, CTABL, no wheezing, rales or other adventitious sounds auscultated Abdominal:      General: There is no distension.  Musculoskeletal:        General: Normal range of motion.     Cervical back: Neck supple.  Skin:    General: Skin is warm and dry.  Neurological:     Mental Status: She is alert and  oriented to person, place, and time.  Psychiatric:        Mood and Affect: Mood and affect normal.      UC Treatments / Results  Labs (all labs ordered are listed, but only abnormal results are displayed) Labs Reviewed  NOVEL CORONAVIRUS, NAA    EKG   Radiology No results found.  Procedures Procedures (including critical care time)  Medications Ordered in UC Medications - No data to display  Initial Impression / Assessment and Plan / UC Course  I have reviewed the triage vital signs and the nursing notes.  Pertinent labs & imaging results that were available during my care of the patient were reviewed by me and considered in my medical decision making (see chart for details).     Treating for tonsillitis/sinusitis given length of symptoms, initiate Augmentin of the does not appear significantly exudative, no sign of other salivary gland infection or sialoadenitis or deep space infection.  Anti-inflammatories for pain, warm compresses and will continue to monitor.  Discussed strict return precautions. Patient verbalized understanding and is agreeable with plan.  Final Clinical Impressions(s) / UC Diagnoses   Final diagnoses:  Encounter for screening laboratory testing for COVID-19 virus  Acute tonsillitis, unspecified etiology     Discharge Instructions     Plan Augmentin twice daily for 10 days Tylenol and ibuprofen for pain and swelling Warm compresses as neck Follow-up if not improving or worsening    ED Prescriptions    Medication Sig Dispense Auth. Provider   amoxicillin-clavulanate (AUGMENTIN) 875-125 MG tablet Take 1 tablet by mouth every 12 (twelve) hours for 10 days. 20 tablet Brigett Estell C, PA-C   ibuprofen (ADVIL) 800 MG tablet  Take 1 tablet (800 mg total) by mouth 3 (three) times daily. 21 tablet Rafaelita Foister, [place] C, PA-C     PDMP not reviewed this encounter.   Janith Lima, [place] 08/08/20 1202

## 2020-08-08 NOTE — ED Triage Notes (Signed)
Pt sts swollen tonsil on left side with pain into ear with some coughing x 3 days

## 2020-08-08 NOTE — Discharge Instructions (Signed)
Plan Augmentin twice daily for 10 days Tylenol and ibuprofen for pain and swelling Warm compresses as neck Follow-up if not improving or worsening

## 2020-08-09 LAB — NOVEL CORONAVIRUS, NAA: SARS-CoV-2, NAA: NOT DETECTED

## 2020-08-09 LAB — SARS-COV-2, NAA 2 DAY TAT

## 2020-08-12 ENCOUNTER — Ambulatory Visit: Payer: Self-pay | Admitting: Critical Care Medicine

## 2020-09-08 ENCOUNTER — Ambulatory Visit: Payer: Self-pay | Admitting: Physician Assistant

## 2020-09-13 ENCOUNTER — Other Ambulatory Visit: Payer: Self-pay

## 2020-09-13 ENCOUNTER — Ambulatory Visit
Admission: EM | Admit: 2020-09-13 | Discharge: 2020-09-13 | Disposition: A | Discharge status: Home / Self Care | Payer: 59 | Attending: Emergency Medicine | Admitting: Emergency Medicine

## 2020-09-13 ENCOUNTER — Encounter: Payer: Self-pay | Admitting: Emergency Medicine

## 2020-09-13 DIAGNOSIS — N3090 Cystitis, unspecified without hematuria: Secondary | ICD-10-CM

## 2020-09-13 LAB — POCT URINALYSIS DIP (MANUAL ENTRY)
Bilirubin, UA: NEGATIVE
Glucose, UA: NEGATIVE mg/dL
Ketones, POC UA: NEGATIVE mg/dL
Leukocytes, UA: NEGATIVE
Nitrite, UA: NEGATIVE
Protein Ur, POC: NEGATIVE mg/dL
Spec Grav, UA: 1.015 (ref 1.010–1.025)
Urobilinogen, UA: 0.2 E.U./dL
pH, UA: 6.5 (ref 5.0–8.0)

## 2020-09-13 LAB — POCT FASTING CBG KUC MANUAL ENTRY: POCT Glucose (KUC): 75 mg/dL (ref 70–99)

## 2020-09-13 LAB — POCT URINE PREGNANCY: Preg Test, Ur: NEGATIVE

## 2020-09-13 MED ORDER — NITROFURANTOIN MONOHYD MACRO 100 MG PO CAPS: 100.0000 mg | ORAL_CAPSULE | Freq: Two times a day (BID) | ORAL | 0 refills | Status: AC

## 2020-09-13 NOTE — ED Provider Notes (Signed)
[name]    CSN: [id number] Arrival date & time: 09/13/20  1518      History   Chief Complaint Chief Complaint  Patient presents with  . Back Pain    HPI Feleshia Zundel is a 40 y.o. female.   [name] presents with complaints of pain and frequency with urination which started last week. Low back pain. Some pelvic pain. Similar to previous UTI's she has had in the past. She noted some blood to her urine initially but this has since resolved. She is no longer menstruating. Her blood sugars have been in the 190's, she is diabetic. No gi symptoms. Denies any vaginal symptoms currently- states she had itching originally but used over the [employer] and this has resolved.     ROS per HPI, negative if not otherwise mentioned.      Past Medical History:  Diagnosis Date  . GERD (gastroesophageal reflux disease)   . Type 2 diabetes mellitus with hyperglycemia, without long-term current use of insulin ([place]) 05/05/2020    Patient Active Problem List   Diagnosis Date Noted  . Elevated LFTs 06/09/2020  . Hypersomnia 06/09/2020  . Type 2 diabetes mellitus with hyperglycemia, without long-term current use of insulin ([place]) 05/05/2020    Past Surgical History:  Procedure Laterality Date  . NO PAST SURGERIES      OB History    Gravida  4   Para  4   Term  4   Preterm      AB      Living  4     SAB      IAB      Ectopic      Multiple  0   Live Births  4            Home Medications    Prior to Admission medications   Medication Sig Start Date End Date Taking? Authorizing Provider  ibuprofen (ADVIL) 800 MG tablet Take 1 tablet (800 mg total) by mouth 3 (three) times daily. 08/08/20  Yes Wieters, Hallie C, PA-C  loratadine (CLARITIN) 10 MG tablet Take 1 tablet (10 mg total) by mouth daily. 10/06/19  Yes Hall-Potvin, [place], PA-C  metFORMIN (GLUCOPHAGE) 500 MG tablet Take 1 tablet (500 mg total) by mouth 2 (two)  times daily with a meal. 06/09/20  Yes Elsie Stain, MD  nitrofurantoin, macrocrystal-monohydrate, (MACROBID) 100 MG capsule Take 1 capsule (100 mg total) by mouth 2 (two) times daily for 5 days. 09/13/20 09/18/20 Yes Eri Platten, Malachy Moan, NP  acetaminophen (TYLENOL) 325 MG tablet Take 650 mg by mouth every 6 (six) hours as needed.    [provider]  Blood Glucose Monitoring Suppl (TRUE METRIX METER) w/Device KIT 1 each by Does not apply route in the morning and at bedtime. 05/05/20   Argentina Donovan, PA-C  esomeprazole (NEXIUM) 20 MG capsule Take 20 mg by mouth daily at 12 noon.    [provider]  fluticasone (FLONASE) 50 MCG/ACT nasal spray Place 2 sprays into both nostrils daily. 05/05/20   Argentina Donovan, PA-C  glucose blood (TRUE METRIX BLOOD GLUCOSE TEST) test strip Use as instructed 05/05/20   Argentina Donovan, PA-C  TRUEplus Lancets 28G MISC 1 each by Does not apply route in the morning and at bedtime. 05/05/20   Argentina Donovan, PA-C  albuterol (VENTOLIN HFA) 108 (90 Base) MCG/ACT inhaler Inhale 1-2 puffs into the lungs every 6 (six) hours as needed  for wheezing or shortness of breath. Patient not taking: Reported on 08/06/2019 07/15/19 02/09/20  Hall-Potvin, [place], PA-C  famotidine (PEPCID) 20 MG tablet One after supper Patient not taking: Reported on 08/06/2019 01/23/19 02/09/20  Tanda Rockers, MD    Family History Family History  Problem Relation Age of Onset  . Diabetes Father   . Hypertension Father   . Diabetes Brother   . Hypertension Mother   . Depression Daughter     Social History Social History   Tobacco Use  . Smoking status: Never Smoker  . Smokeless tobacco: Never Used  Vaping Use  . Vaping Use: Never used  Substance Use Topics  . Alcohol use: Not Currently    Comment: rarely  . Drug use: No     Allergies   Penicillins   Review of Systems Review of Systems   Physical Exam Triage Vital Signs ED Triage Vitals  Enc Vitals  Group     BP 09/13/20 1626 120/83     Pulse Rate 09/13/20 1626 90     Resp 09/13/20 1626 18     Temp 09/13/20 1626 97.9 F (36.6 C)     Temp Source 09/13/20 1626 Oral     SpO2 09/13/20 1626 97 %     Weight --      Height --      Head Circumference --      Peak Flow --      Pain Score 09/13/20 1629 7     Pain Loc --      Pain Edu? --      Excl. in [place]? --    No data found.  Updated Vital Signs BP 120/83 (BP Location: Left Arm)   Pulse 90   Temp 97.9 F (36.6 C) (Oral)   Resp 18   LMP 09/06/2020   SpO2 97%    Physical Exam Constitutional:      General: She is not in acute distress.    Appearance: She is well-developed.  Cardiovascular:     Rate and Rhythm: Normal rate.  Pulmonary:     Effort: Pulmonary effort is normal.  Abdominal:     Tenderness: There is no abdominal tenderness. There is no right CVA tenderness or left CVA tenderness.     Comments: Low back soreness on palpation with mild pelvic pressure on palpation   Skin:    General: Skin is warm and dry.  Neurological:     Mental Status: She is alert and oriented to person, place, and time.      UC Treatments / Results  Labs (all labs ordered are listed, but only abnormal results are displayed) Labs Reviewed  POCT URINALYSIS DIP (MANUAL ENTRY) - Abnormal; Notable for the following components:      Result Value   Blood, UA small (*)    All other components within normal limits  POCT URINE PREGNANCY    EKG   Radiology No results found.  Procedures Procedures (including critical care time)  Medications Ordered in UC Medications - No data to display  Initial Impression / Assessment and Plan / UC Course  I have reviewed the triage vital signs and the nursing notes.  Pertinent labs & imaging results that were available during my care of the patient were reviewed by me and considered in my medical decision making (see chart for details).     UTI symptoms although urine is fairly unremarkable.  Blood noted despite no longer on her period. Opted to initiate macrobid  pending urine culture. Return precautions provided. Patient verbalized understanding and agreeable to plan.   Final Clinical Impressions(s) / UC Diagnoses   Final diagnoses:  Cystitis     Discharge Instructions     Drink plenty of water to empty bladder regularly. Avoid alcohol and caffeine as these may irritate the bladder.   Complete course of antibiotics.  If symptoms worsen or do not improve in the next week to return to be seen or to follow up with your PCP.     ED Prescriptions    Medication Sig Dispense Auth. Provider   nitrofurantoin, macrocrystal-monohydrate, (MACROBID) 100 MG capsule Take 1 capsule (100 mg total) by mouth 2 (two) times daily for 5 days. 10 capsule Zigmund Gottron, NP     PDMP not reviewed this encounter.   Zigmund Gottron, NP 09/13/20 1725

## 2020-09-13 NOTE — ED Triage Notes (Signed)
Initially had vaginal itching.  Used otc medicine followed by starting her menstrual cycle.  Patient has urinary frequency, urgency.  Pain is now in her back.  Patient has taken otc azo and had minimal relief.

## 2020-09-13 NOTE — Discharge Instructions (Addendum)
Drink plenty of water to empty bladder regularly. Avoid alcohol and caffeine as these may irritate the bladder.  Complete course of antibiotics.  If symptoms worsen or do not improve in the next week to return to be seen or to follow up with your PCP.   

## 2020-09-18 ENCOUNTER — Other Ambulatory Visit: Payer: Self-pay

## 2020-09-23 ENCOUNTER — Other Ambulatory Visit: Payer: Self-pay

## 2020-09-23 MED FILL — Metformin HCl Tab 500 MG: ORAL | 30 days supply | Qty: 120 | Fill #0 | Status: AC

## 2020-09-27 ENCOUNTER — Other Ambulatory Visit: Payer: Self-pay

## 2020-10-14 ENCOUNTER — Encounter: Payer: Self-pay | Admitting: Physician Assistant

## 2020-10-14 ENCOUNTER — Other Ambulatory Visit: Payer: Self-pay

## 2020-10-14 ENCOUNTER — Ambulatory Visit: Payer: 59 | Attending: Critical Care Medicine | Admitting: Physician Assistant

## 2020-10-14 VITALS — BP 110/70 | HR 77 | Resp 18 | Ht 63.0 in | Wt 217.6 lb

## 2020-10-14 DIAGNOSIS — M25539 Pain in unspecified wrist: Secondary | ICD-10-CM

## 2020-10-14 DIAGNOSIS — G5603 Carpal tunnel syndrome, bilateral upper limbs: Secondary | ICD-10-CM

## 2020-10-14 DIAGNOSIS — E1165 Type 2 diabetes mellitus with hyperglycemia: Secondary | ICD-10-CM | POA: Diagnosis not present

## 2020-10-14 DIAGNOSIS — F5089 Other specified eating disorder: Secondary | ICD-10-CM | POA: Diagnosis not present

## 2020-10-14 DIAGNOSIS — Z862 Personal history of diseases of the blood and blood-forming organs and certain disorders involving the immune mechanism: Secondary | ICD-10-CM

## 2020-10-14 DIAGNOSIS — R7989 Other specified abnormal findings of blood chemistry: Secondary | ICD-10-CM

## 2020-10-14 LAB — POCT GLYCOSYLATED HEMOGLOBIN (HGB A1C): HbA1c, POC (controlled diabetic range): 6.3 % (ref 0.0–7.0)

## 2020-10-14 LAB — GLUCOSE, POCT (MANUAL RESULT ENTRY): POC Glucose: 116 mg/dl — AB (ref 70–99)

## 2020-10-14 MED ORDER — METFORMIN HCL 500 MG PO TABS: ORAL_TABLET | ORAL | 3 refills | Status: DC

## 2020-10-14 NOTE — Progress Notes (Signed)
[facility] [name], is a 41 y.o. female  VXY:[id number]  MOL:[id number]  DOB - 01-06-1981  Subjective:  Chief Complaint and HPI: Victoria Schneider is a 40 y.o. female here today for recheck diabetes.   She has only been taking metformin 555m bid.  She has lost 12 pounds over the last 5 months with diet changes.   She does not exercise regularly.  She c/o B wrist pain that is worse at night.  She does have some paresthesias but says they are mild.    Also says she has a h/o iron deficiency and has been craving chewing ice.  Her periods are heavy sometimes.     ROS:   Constitutional:  No f/c, No night sweats, No unexplained weight loss. EENT:  No vision changes, No blurry vision, No hearing changes. No mouth, throat, or ear problems.  Respiratory: No cough, No SOB Cardiac: No CP, no palpitations GI:  No abd pain, No N/V/D. GU: No Urinary s/sx Musculoskeletal: see above Neuro: No headache, no dizziness, no motor weakness.  Skin: No rash Endocrine:  No polydipsia. No polyuria.  Psych: Denies SI/HI  No problems updated.  ALLERGIES: Allergies  Allergen Reactions  . Penicillins Rash and Other (See Comments)    Has patient had a PCN reaction causing immediate rash, facial/tongue/throat swelling, SOB or lightheadedness with hypotension: No Has patient had a PCN reaction causing severe rash involving mucus membranes or skin necrosis: No Has patient had a PCN reaction that required hospitalization No Has patient had a PCN reaction occurring within the last 10 years: Yes If all of the above answers are "NO", then may proceed with Cephalosporin use.     PAST MEDICAL HISTORY: Past Medical History:  Diagnosis Date  . GERD (gastroesophageal reflux disease)   . Type 2 diabetes mellitus with hyperglycemia, without long-term [place] use of insulin [place] 05/05/2020    MEDICATIONS AT HOME: Prior to Admission medications   Medication Sig Start Date End Date Taking? Authorizing  Provider  acetaminophen (TYLENOL) 325 MG tablet Take 650 mg by mouth every 6 (six) hours as needed.   Yes [provider]  Blood Glucose Monitoring Suppl (TRUE METRIX METER) w/Device KIT 1 each by Does not apply route in the morning and at bedtime. 05/05/20  Yes MFreeman CaldronM, PA-C  esomeprazole (NEXIUM) 20 MG capsule Take 20 mg by mouth daily at 12 noon.   Yes [provider]  fluticasone (FLONASE) 50 MCG/ACT nasal spray PLACE 2 SPRAYS INTO BOTH NOSTRILS DAILY. 05/05/20 05/05/21 Yes Mahathi Pokorney, ADionne Bucy PA-C  glucose blood (TRUE METRIX BLOOD GLUCOSE TEST) test strip Use as instructed 05/05/20  Yes Romulus Hanrahan M, PA-C  ibuprofen (ADVIL) 800 MG tablet Take 1 tablet (800 mg total) by mouth 3 (three) times daily. 08/08/20  Yes Wieters, Hallie C, PA-C  loratadine (CLARITIN) 10 MG tablet Take 1 tablet (10 mg total) by mouth daily. 10/06/19  Yes Hall-Potvin, [place] PA-C  TRUEplus Lancets 28G MISC USE AS DIRECTED IN THE MORNING AND AT BEDTIME 05/05/20 05/05/21 Yes Cola Highfill M, PA-C  metFORMIN (GLUCOPHAGE) 500 MG tablet TAKE 2 TABLETS (1,000 MG TOTAL) BY MOUTH 2 (TWO) TIMES DAILY WITH A MEAL. Patient not taking: Reported on 10/14/2020 05/26/20 05/26/21  NCharlott Rakes MD  metFORMIN (GLUCOPHAGE) 500 MG tablet 2 tabs with morning meal; 1 tab with evening meal 10/14/20   MArgentina Donovan PA-C  albuterol (VENTOLIN HFA) 108 (90 Base) MCG/ACT inhaler Inhale 1-2 puffs into the lungs every 6 (six)  hours as needed for wheezing or shortness of breath. Patient not taking: Reported on 08/06/2019 07/15/19 02/09/20  Hall-Potvin, [place], PA-C  famotidine (PEPCID) 20 MG tablet One after supper Patient not taking: Reported on 08/06/2019 01/23/19 02/09/20  Tanda Rockers, MD     Objective:  EXAM:   Vitals:   10/14/20 1012  BP: 110/70  Pulse: 77  Resp: 18  SpO2: 98%  Weight: 217 lb 9.6 oz (98.7 kg)  Height: '5\' 3"'  (1.6 m)    General appearance : A&OX3. NAD. Non-toxic-appearing HEENT:  Atraumatic and Normocephalic.  PERRLA. EOM intact.   Chest/Lungs:  Breathing-non-labored, Good air entry bilaterally, breath sounds normal without rales, rhonchi, or wheezing  CVS: S1 S2 regular, no murmurs, gallops, rubs  B wrists/hands-full S&ROM.  +phalen's;  Neg tinel's Extremities: Bilateral Lower Ext shows no edema, both legs are warm to touch with = pulse throughout Neurology:  CN II-XII grossly intact, Non focal.   Psych:  TP linear. J/I WNL. Normal speech. Appropriate eye contact and affect.  Skin:  No Rash  Data Review Lab Results  Component Value Date   HGBA1C 6.3 10/14/2020   HGBA1C 8.1 (A) 05/05/2020     Assessment & Plan   1. Type 2 diabetes mellitus with hyperglycemia, without long-term current use of insulin (HCC) Not at goal on 500 bid-increase to 1000 in am and 500 with dinner - Glucose (CBG) - HgB A1c - Comprehensive metabolic panel - Thyroid Panel With TSH - metFORMIN (GLUCOPHAGE) 500 MG tablet; 2 tabs with morning meal; 1 tab with evening meal  Dispense: 90 tablet; Refill: 3  2. [place] - CBC with Differential/Platelet - Vitamin D, 25-hydroxy - Iron, TIBC and Ferritin Panel  3. Bilateral carpal tunnel syndrome B wrist splints for sleep.  I showed her pic online of what to order.  - Thyroid Panel With TSH  4. Elevated LFTs - Comprehensive metabolic panel  5. Pain in wrist, unspecified laterality Suspect CTS;  Patient concerned about bone pain, so labs done as well  6. H/O: iron deficiency anemia - Iron, TIBC and Ferritin Panel  Patient have been counseled extensively about nutrition and exercise  Return in about 4 months (around 02/13/2021) for with PCP.  The patient was given clear instructions to go to ER or return to medical center if symptoms don't improve, worsen or new problems develop. The patient verbalized understanding. The patient was told to call to get lab results if they haven't heard anything in the next week.     Freeman Caldron,  PA-C [facility], [place]   10/14/2020, 10:42 AMPatient ID: Victoria Schneider, female   DOB: 01/04/1981, 40 y.o.   MRN: [id number]

## 2020-10-14 NOTE — Patient Instructions (Signed)
Carpal Tunnel Syndrome  Carpal tunnel syndrome is a condition that causes pain, weakness, and numbness in your hand and arm. Numbness is when you cannot feel an area in your body. The carpal tunnel is a narrow area that is on the palm side of your wrist. Repeated wrist motion or certain diseases may cause swelling in the tunnel. This swelling can pinch the main nerve in the wrist. This nerve is called the median nerve. What are the causes? This condition may be caused by:  Moving your hand and wrist over and over again while doing a task.  Injury to the wrist.  Arthritis.  A sac of fluid (cyst) or abnormal growth (tumor) in the carpal tunnel.  Fluid buildup during pregnancy.  Use of tools that vibrate. Sometimes the cause is not known. What increases the risk? The following factors may make you more likely to have this condition:  Having a job that makes you do these things: ? Move your hand over and over again. ? Work with tools that vibrate, such as drills or sanders.  Being a woman.  Having diabetes, obesity, thyroid problems, or kidney failure. What are the signs or symptoms? Symptoms of this condition include:  A tingling feeling in your fingers.  Tingling or loss of feeling in your hand.  Pain in your entire arm. This pain may get worse when you bend your wrist and elbow for a long time.  Pain in your wrist that goes up your arm to your shoulder.  Pain that goes down into your palm or fingers.  Weakness in your hands. You may find it hard to grab and hold items. You may feel worse at night. How is this treated? This condition may be treated with:  Lifestyle changes. You will be asked to stop or change the activity that caused your problem.  Doing exercises and activities that make bones, muscles, and tendons stronger (physical therapy).  Learning how to use your hand again (occupational therapy).  Medicines for pain and swelling. You may have injections in  your wrist.  A wrist splint or brace.  Surgery. Follow these instructions at home: If you have a splint or brace:  Wear the splint or brace as told by your doctor. Take it off only as told by your doctor.  Loosen the splint if your fingers: ? Tingle. ? Become numb. ? Turn cold and blue.  Keep the splint or brace clean.  If the splint or brace is not waterproof: ? Do not let it get wet. ? Cover it with a watertight covering when you take a bath or a shower. Managing pain, stiffness, and swelling If told, put ice on the painful area:  If you have a removable splint or brace, remove it as told by your doctor.  Put ice in a plastic bag.  Place a towel between your skin and the bag.  Leave the ice on for 20 minutes, 2-3 times per day. Do not fall asleep with the cold pack on your skin.  Take off the ice if your skin turns bright red. This is very important. If you cannot feel pain, heat, or cold, you have a greater risk of damage to the area. Move your fingers often to reduce stiffness and swelling.   General instructions  Take over-the-counter and prescription medicines only as told by your doctor.  Rest your wrist from any activity that may cause pain. If needed, talk with your boss at work about changes that can   help your wrist heal.  Do exercises as told by your doctor, physical therapist, or occupational therapist.  Keep all follow-up visits. Contact a doctor if:  You have new symptoms.  Medicine does not help your pain.  Your symptoms get worse. Get help right away if:  You have very bad numbness or tingling in your wrist or hand. Summary  Carpal tunnel syndrome is a condition that causes pain in your hand and arm.  It is often caused by repeated wrist motions.  Lifestyle changes and medicines are used to treat this problem. Surgery may help in very bad cases.  Follow your doctor's instructions about wearing a splint, resting your wrist, keeping follow-up  visits, and calling for help. This information is not intended to replace advice given to you by your health care provider. Make sure you discuss any questions you have with your health care provider. Document Revised: 10/16/2019 Document Reviewed: 10/16/2019 Elsevier Patient Education  2021 [employer]  

## 2020-10-15 LAB — CBC WITH DIFFERENTIAL/PLATELET
Basophils Absolute: 0 10*3/uL (ref 0.0–0.2)
Basos: 0 %
EOS (ABSOLUTE): 0.1 10*3/uL (ref 0.0–0.4)
Eos: 2 %
Hematocrit: 34.3 % (ref 34.0–46.6)
Hemoglobin: 10.3 g/dL — ABNORMAL LOW (ref 11.1–15.9)
Immature Grans (Abs): 0 10*3/uL (ref 0.0–0.1)
Immature Granulocytes: 0 %
Lymphocytes Absolute: 2.7 10*3/uL (ref 0.7–3.1)
Lymphs: 36 %
MCH: 22.2 pg — ABNORMAL LOW (ref 26.6–33.0)
MCHC: 30 g/dL — ABNORMAL LOW (ref 31.5–35.7)
MCV: 74 fL — ABNORMAL LOW (ref 79–97)
Monocytes Absolute: 0.6 10*3/uL (ref 0.1–0.9)
Monocytes: 8 %
Neutrophils Absolute: 4 10*3/uL (ref 1.4–7.0)
Neutrophils: 54 %
Platelets: 318 10*3/uL (ref 150–450)
RBC: 4.64 x10E6/uL (ref 3.77–5.28)
RDW: 16 % — ABNORMAL HIGH (ref 11.7–15.4)
WBC: 7.5 10*3/uL (ref 3.4–10.8)

## 2020-10-15 LAB — COMPREHENSIVE METABOLIC PANEL
ALT: 52 IU/L — ABNORMAL HIGH (ref 0–32)
AST: 43 IU/L — ABNORMAL HIGH (ref 0–40)
Albumin/Globulin Ratio: 1.3 (ref 1.2–2.2)
Albumin: 4 g/dL (ref 3.8–4.8)
Alkaline Phosphatase: 142 IU/L — ABNORMAL HIGH (ref 44–121)
BUN/Creatinine Ratio: 12 (ref 9–23)
BUN: 7 mg/dL (ref 6–24)
Bilirubin Total: 0.5 mg/dL (ref 0.0–1.2)
CO2: 21 mmol/L (ref 20–29)
Calcium: 8.8 mg/dL (ref 8.7–10.2)
Chloride: 103 mmol/L (ref 96–106)
Creatinine, Ser: 0.58 mg/dL (ref 0.57–1.00)
Globulin, Total: 3.2 g/dL (ref 1.5–4.5)
Glucose: 106 mg/dL — ABNORMAL HIGH (ref 65–99)
Potassium: 4.3 mmol/L (ref 3.5–5.2)
Sodium: 139 mmol/L (ref 134–144)
Total Protein: 7.2 g/dL (ref 6.0–8.5)
eGFR: 117 mL/min/{1.73_m2} (ref >59)

## 2020-10-15 LAB — THYROID PANEL WITH TSH
Free Thyroxine Index: 1.4 (ref 1.2–4.9)
T3 Uptake Ratio: 21 % — ABNORMAL LOW (ref 24–39)
T4, Total: 6.8 ug/dL (ref 4.5–12.0)
TSH: 2.67 u[IU]/mL (ref 0.450–4.500)

## 2020-10-15 LAB — IRON,TIBC AND FERRITIN PANEL
Ferritin: 21 ng/mL (ref 15–150)
Iron Saturation: 6 % — CL (ref 15–55)
Iron: 26 ug/dL — ABNORMAL LOW (ref 27–159)
Total Iron Binding Capacity: 421 ug/dL (ref 250–450)
UIBC: 395 ug/dL (ref 131–425)

## 2020-10-15 LAB — VITAMIN D 25 HYDROXY (VIT D DEFICIENCY, FRACTURES): Vit D, 25-Hydroxy: 22.6 ng/mL — ABNORMAL LOW (ref 30.0–100.0)

## 2020-10-20 ENCOUNTER — Other Ambulatory Visit: Payer: Self-pay

## 2020-10-20 ENCOUNTER — Other Ambulatory Visit: Payer: Self-pay | Admitting: Physician Assistant

## 2020-10-20 MED ORDER — FERROUS SULFATE 325 (65 FE) MG PO TABS
325.0000 mg | ORAL_TABLET | Freq: Every day | ORAL | 1 refills | Status: DC
  Filled 2020-10-20: qty 30, 30d supply, fill #0

## 2020-10-20 MED ORDER — VITAMIN D (ERGOCALCIFEROL) 1.25 MG (50000 UNIT) PO CAPS
50000.0000 [IU] | ORAL_CAPSULE | ORAL | 0 refills | Status: DC
  Filled 2020-10-20: qty 12, 84d supply, fill #0

## 2020-11-25 ENCOUNTER — Other Ambulatory Visit: Payer: Self-pay

## 2021-01-11 ENCOUNTER — Ambulatory Visit
Admission: EM | Admit: 2021-01-11 | Discharge: 2021-01-11 | Disposition: A | Discharge status: Home / Self Care | Payer: 59 | Attending: Student | Admitting: Student

## 2021-01-11 ENCOUNTER — Other Ambulatory Visit: Payer: Self-pay

## 2021-01-11 ENCOUNTER — Encounter: Payer: Self-pay | Admitting: Emergency Medicine

## 2021-01-11 DIAGNOSIS — E1169 Type 2 diabetes mellitus with other specified complication: Secondary | ICD-10-CM | POA: Diagnosis not present

## 2021-01-11 DIAGNOSIS — N3001 Acute cystitis with hematuria: Secondary | ICD-10-CM | POA: Insufficient documentation

## 2021-01-11 DIAGNOSIS — Z7984 Long term (current) use of oral hypoglycemic drugs: Secondary | ICD-10-CM

## 2021-01-11 LAB — POCT URINALYSIS DIP (MANUAL ENTRY)
Bilirubin, UA: NEGATIVE
Glucose, UA: NEGATIVE mg/dL
Ketones, POC UA: NEGATIVE mg/dL
Nitrite, UA: NEGATIVE
Protein Ur, POC: 100 mg/dL — AB
Spec Grav, UA: 1.02 (ref 1.010–1.025)
Urobilinogen, UA: 0.2 E.U./dL
pH, UA: 6.5 (ref 5.0–8.0)

## 2021-01-11 LAB — POCT URINE PREGNANCY: Preg Test, Ur: NEGATIVE

## 2021-01-11 MED ORDER — NITROFURANTOIN MONOHYD MACRO 100 MG PO CAPS: 100.0000 mg | ORAL_CAPSULE | Freq: Two times a day (BID) | ORAL | 0 refills | Status: DC

## 2021-01-11 NOTE — ED Provider Notes (Signed)
[name]    CSN: [id number] Arrival date & time: 01/11/21  0947      History   Chief Complaint Chief Complaint  Patient presents with   Dysuria    HPI Victoria Schneider is a 40 y.o. female presenting with 1 week of dysuria.  Medical history diabetes, GERD.  Last UTI was 09/13/2020.  Describes 1 week of dysuria and suprapubic pressure. Denies hematuria, frequency, urgency, back pain, n/v/d fevers/chills, abdnormal vaginal discharge. Denies new partners/STI risk.providing some relief.   HPI  Past Medical History:  Diagnosis Date   GERD (gastroesophageal reflux disease)    Type 2 diabetes mellitus with hyperglycemia, without long-term current use of insulin ([place]) 05/05/2020    Patient Active Problem List   Diagnosis Date Noted   Elevated LFTs 06/09/2020   Hypersomnia 06/09/2020   Type 2 diabetes mellitus with hyperglycemia, without long-term current use of insulin ([place]) 05/05/2020    Past Surgical History:  Procedure Laterality Date   NO PAST SURGERIES      OB History     Gravida  4   Para  4   Term  4   Preterm      AB      Living  4      SAB      IAB      Ectopic      Multiple  0   Live Births  4            Home Medications    Prior to Admission medications   Medication Sig Start Date End Date Taking? Authorizing Provider  nitrofurantoin, macrocrystal-monohydrate, (MACROBID) 100 MG capsule Take 1 capsule (100 mg total) by mouth 2 (two) times daily. 01/11/21  Yes Hazel Sams, PA-C  acetaminophen (TYLENOL) 325 MG tablet Take 650 mg by mouth every 6 (six) hours as needed.    [provider]  Blood Glucose Monitoring Suppl (TRUE METRIX METER) w/Device KIT 1 each by Does not apply route in the morning and at bedtime. 05/05/20   Argentina Donovan, PA-C  esomeprazole (NEXIUM) 20 MG capsule Take 20 mg by mouth daily at 12 noon.    [provider]  fluticasone (FLONASE) 50 MCG/ACT nasal spray PLACE 2 SPRAYS  INTO BOTH NOSTRILS DAILY. 05/05/20 05/05/21  Argentina Donovan, PA-C  glucose blood (TRUE METRIX BLOOD GLUCOSE TEST) test strip Use as instructed 05/05/20   Argentina Donovan, PA-C  ibuprofen (ADVIL) 800 MG tablet Take 1 tablet (800 mg total) by mouth 3 (three) times daily. 08/08/20   Wieters, Hallie C, PA-C  ferrous sulfate 325 (65 FE) MG tablet Take 1 tablet (325 mg total) by mouth daily. 10/20/20   Argentina Donovan, PA-C  loratadine (CLARITIN) 10 MG tablet Take 1 tablet (10 mg total) by mouth daily. 10/06/19   Hall-Potvin, [place], PA-C  metFORMIN (GLUCOPHAGE) 500 MG tablet TAKE 2 TABLETS (1,000 MG TOTAL) BY MOUTH 2 (TWO) TIMES DAILY WITH A MEAL. Patient not taking: Reported on 10/14/2020 05/26/20 05/26/21  Charlott Rakes, MD  metFORMIN (GLUCOPHAGE) 500 MG tablet 2 tabs with morning meal; 1 tab with evening meal 10/14/20   Argentina Donovan, PA-C  TRUEplus Lancets 28G MISC USE AS DIRECTED IN THE MORNING AND AT BEDTIME 05/05/20 05/05/21  Argentina Donovan, PA-C  Vitamin D, Ergocalciferol, (DRISDOL) 1.25 MG (50000 UNIT) CAPS capsule Take 1 capsule (50,000 Units total) by mouth every 7 (seven) days. 10/20/20   Argentina Donovan, PA-C  albuterol (VENTOLIN  HFA) 108 (90 Base) MCG/ACT inhaler Inhale 1-2 puffs into the lungs every 6 (six) hours as needed for wheezing or shortness of breath. Patient not taking: Reported on 08/06/2019 07/15/19 02/09/20  Hall-Potvin, [place], PA-C  famotidine (PEPCID) 20 MG tablet One after supper Patient not taking: Reported on 08/06/2019 01/23/19 02/09/20  Tanda Rockers, MD    Family History Family History  Problem Relation Age of Onset   Diabetes Father    Hypertension Father    Diabetes Brother    Hypertension Mother    Depression Daughter     Social History Social History   Tobacco Use   Smoking status: Never   Smokeless tobacco: Never  Vaping Use   Vaping Use: Never used  Substance Use Topics   Alcohol use: Not Currently    Comment: rarely   Drug use: No      Allergies   Penicillins   Review of Systems Review of Systems  Constitutional:  Negative for appetite change, chills, diaphoresis and fever.  Respiratory:  Negative for shortness of breath.   Cardiovascular:  Negative for chest pain.  Gastrointestinal:  Positive for abdominal pain. Negative for blood in stool, constipation, diarrhea, nausea and vomiting.  Genitourinary:  Positive for dysuria. Negative for decreased urine volume, difficulty urinating, flank pain, frequency, genital sores, hematuria and urgency.  Musculoskeletal:  Negative for back pain.  Neurological:  Negative for dizziness, weakness and light-headedness.  All other systems reviewed and are negative.   Physical Exam Triage Vital Signs ED Triage Vitals [01/11/21 1121]  Enc Vitals Group     BP 125/74     Pulse Rate 79     Resp 18     Temp 98 F (36.7 C)     Temp Source Oral     SpO2 97 %     Weight      Height      Head Circumference      Peak Flow      Pain Score 2     Pain Loc      Pain Edu?      Excl. in [place]?    No data found.  Updated Vital Signs BP 125/74 (BP Location: Left Arm)   Pulse 79   Temp 98 F (36.7 C) (Oral)   Resp 18   SpO2 97%   Visual Acuity Right Eye Distance:   Left Eye Distance:   Bilateral Distance:    Right Eye Near:   Left Eye Near:    Bilateral Near:     Physical Exam Vitals reviewed.  Constitutional:      General: She is not in acute distress.    Appearance: Normal appearance. She is not ill-appearing.  HENT:     Head: Normocephalic and atraumatic.  Cardiovascular:     Rate and Rhythm: Normal rate and regular rhythm.     Heart sounds: Normal heart sounds.  Pulmonary:     Effort: Pulmonary effort is normal.     Breath sounds: Normal breath sounds.  Abdominal:     General: Bowel sounds are normal. There is no distension.     Palpations: Abdomen is soft. There is no mass.     Tenderness: There is abdominal tenderness in the suprapubic area. There is no  right CVA tenderness, left CVA tenderness, guarding or rebound. Negative signs include Murphy's sign, Rovsing's sign and McBurney's sign.  Neurological:     General: No focal deficit present.     Mental Status: She is alert  and oriented to person, place, and time. Mental status is at baseline.  Psychiatric:        Mood and Affect: Mood normal.        Behavior: Behavior normal.        Thought Content: Thought content normal.        Judgment: Judgment normal.     UC Treatments / Results  Labs (all labs ordered are listed, but only abnormal results are displayed) Labs Reviewed  POCT URINALYSIS DIP (MANUAL ENTRY) - Abnormal; Notable for the following components:      Result Value   Clarity, UA cloudy (*)    Blood, UA moderate (*)    Protein Ur, POC =100 (*)    Leukocytes, UA Large (3+) (*)    All other components within normal limits  URINE CULTURE  POCT URINE PREGNANCY    EKG   Radiology No results found.  Procedures Procedures (including critical care time)  Medications Ordered in UC Medications - No data to display  Initial Impression / Assessment and Plan / UC Course  I have reviewed the triage vital signs and the nursing notes.  Pertinent labs & imaging results that were available during my care of the patient were reviewed by me and considered in my medical decision making (see chart for details).     This patient is a very pleasant 40 y.o. year old female presenting with acute cystitis with hematuria. Afebrile, nontachy. Suprapubic pressure but no CVAT.   Last UTI was 08/2020. UA with moderate blood, large leuk; culture sent.  Urine pregnancy negative.   Pt is penicillin allergic. Macrobid as below. Good hydration.  ED return precautions discussed. Patient verbalizes understanding and agreement.   Coding this visit a level 4 for review of past notes and labs, order and interpretation of labs today, prescription drug management.  Final Clinical  Impressions(s) / UC Diagnoses   Final diagnoses:  Acute cystitis with hematuria  Type 2 diabetes mellitus with other specified complication, without long-term current use of insulin ([place])     Discharge Instructions      -Macrobid (nitrofurantoin) twice daily for 5 days.  You can take this with food if you have a sensitive stomach -Drink plenty of fluids -For diabetes, continue current regimen.  Limit sugary beverages like cranberry juice. -We will call you in 2 to 3 days if we need to change anything with the antibiotic.     ED Prescriptions     Medication Sig Dispense Auth. Provider   nitrofurantoin, macrocrystal-monohydrate, (MACROBID) 100 MG capsule Take 1 capsule (100 mg total) by mouth 2 (two) times daily. 10 capsule Hazel Sams, PA-C      PDMP not reviewed this encounter.   Hazel Sams, PA-C 01/11/21 1252

## 2021-01-11 NOTE — Discharge Instructions (Addendum)
-  Macrobid (nitrofurantoin) twice daily for 5 days.  You can take this with food if you have a sensitive stomach -Drink plenty of fluids -For diabetes, continue current regimen.  Limit sugary beverages like cranberry juice. -We will call you in 2 to 3 days if we need to change anything with the antibiotic.

## 2021-01-11 NOTE — ED Triage Notes (Signed)
Pt here for dysuria x 1 week; pt sts taking AZO without relief

## 2021-01-14 LAB — URINE CULTURE: Culture: 20000 — AB

## 2021-01-20 ENCOUNTER — Encounter: Payer: Self-pay | Admitting: Podiatry

## 2021-01-20 ENCOUNTER — Other Ambulatory Visit: Payer: Self-pay

## 2021-01-20 ENCOUNTER — Ambulatory Visit: Payer: 59

## 2021-01-20 ENCOUNTER — Ambulatory Visit (INDEPENDENT_AMBULATORY_CARE_PROVIDER_SITE_OTHER): Payer: 59 | Admitting: Podiatry

## 2021-01-20 DIAGNOSIS — M778 Other enthesopathies, not elsewhere classified: Secondary | ICD-10-CM

## 2021-01-20 DIAGNOSIS — L600 Ingrowing nail: Secondary | ICD-10-CM | POA: Diagnosis not present

## 2021-01-20 MED ORDER — NEOMYCIN-POLYMYXIN-HC 1 % OT SOLN
OTIC | 1 refills | Status: DC
Start: 2021-01-20 — End: 2021-12-01

## 2021-01-20 NOTE — Patient Instructions (Signed)

## 2021-01-23 NOTE — Progress Notes (Signed)
Subjective:  Patient ID: Victoria Schneider, female    DOB: 11-25-1980,  MRN: [id number] HPI Chief Complaint  Patient presents with   Toe Pain    Hallux left - medial border x few weeks, red, tender, swollen, lateral border was sore, but seems better today   Diabetes    Last a1c was 6.3   New Patient (Initial Visit)    40 y.o. female presents with the above complaint.   ROS: Denies fever chills nausea vomiting muscle aches pains calf pain back pain chest pain shortness of breath.  Past Medical History:  Diagnosis Date   GERD (gastroesophageal reflux disease)    Type 2 diabetes mellitus with hyperglycemia, without long-term current use of insulin ([place]) 05/05/2020   Past Surgical History:  Procedure Laterality Date   NO PAST SURGERIES      Current Outpatient Medications:    NEOMYCIN-POLYMYXIN-HYDROCORTISONE (CORTISPORIN) 1 % SOLN OTIC solution, Apply 1-2 drops to toe BID after soaking, Disp: 10 mL, Rfl: 1   acetaminophen (TYLENOL) 325 MG tablet, Take 650 mg by mouth every 6 (six) hours as needed., Disp: , Rfl:    Blood Glucose Monitoring Suppl (TRUE METRIX METER) w/Device KIT, 1 each by Does not apply route in the morning and at bedtime., Disp: 1 kit, Rfl: 0   esomeprazole (NEXIUM) 20 MG capsule, Take 20 mg by mouth daily at 12 noon., Disp: , Rfl:    fluticasone (FLONASE) 50 MCG/ACT nasal spray, PLACE 2 SPRAYS INTO BOTH NOSTRILS DAILY., Disp: 16 g, Rfl: 6   glucose blood (TRUE METRIX BLOOD GLUCOSE TEST) test strip, Use as instructed, Disp: 100 each, Rfl: 12   ibuprofen (ADVIL) 800 MG tablet, Take 1 tablet (800 mg total) by mouth 3 (three) times daily., Disp: 21 tablet, Rfl: 0   ferrous sulfate 325 (65 FE) MG tablet, Take 1 tablet (325 mg total) by mouth daily., Disp: 100 tablet, Rfl: 1   loratadine (CLARITIN) 10 MG tablet, Take 1 tablet (10 mg total) by mouth daily., Disp: 30 tablet, Rfl: 0   metFORMIN (GLUCOPHAGE) 500 MG tablet, TAKE 2 TABLETS (1,000 MG TOTAL) BY MOUTH 2 (TWO)  TIMES DAILY WITH A MEAL. (Patient not taking: Reported on 10/14/2020), Disp: 120 tablet, Rfl: 2   metFORMIN (GLUCOPHAGE) 500 MG tablet, 2 tabs with morning meal; 1 tab with evening meal, Disp: 90 tablet, Rfl: 3   nitrofurantoin, macrocrystal-monohydrate, (MACROBID) 100 MG capsule, Take 1 capsule (100 mg total) by mouth 2 (two) times daily., Disp: 10 capsule, Rfl: 0   TRUEplus Lancets 28G MISC, USE AS DIRECTED IN THE MORNING AND AT BEDTIME, Disp: 100 each, Rfl: 2   Vitamin D, Ergocalciferol, (DRISDOL) 1.25 MG (50000 UNIT) CAPS capsule, Take 1 capsule (50,000 Units total) by mouth every 7 (seven) days., Disp: 16 capsule, Rfl: 0  Allergies  Allergen Reactions   Penicillins Rash and Other (See Comments)    Has patient had a PCN reaction causing immediate rash, facial/tongue/throat swelling, SOB or lightheadedness with hypotension: No Has patient had a PCN reaction causing severe rash involving mucus membranes or skin necrosis: No Has patient had a PCN reaction that required hospitalization No Has patient had a PCN reaction occurring within the last 10 years: Yes If all of the above answers are "NO", then may proceed with Cephalosporin use.    Review of Systems Objective:  There were no vitals filed for this visit.  General: Well developed, nourished, in no acute distress, alert and oriented x3   Dermatological: Skin is  warm, dry and supple bilateral. Nails x 10 are well maintained; remaining integument appears unremarkable at this time. There are no open sores, no preulcerative lesions, no rash or signs of infection present.  Sharp incurvated nail margin medial border of the hallux left with mild erythema no edema cellulitis drainage or odor.  Vascular: Dorsalis Pedis artery and Posterior Tibial artery pedal pulses are 2/4 bilateral with immedate capillary fill time. Pedal hair growth present. No varicosities and no lower extremity edema present bilateral.   Neruologic: Grossly intact via light  touch bilateral. Vibratory intact via tuning fork bilateral. Protective threshold with Semmes Wienstein monofilament intact to all pedal sites bilateral. Patellar and Achilles deep tendon reflexes 2+ bilateral. No Babinski or clonus noted bilateral.   Musculoskeletal: No gross boney pedal deformities bilateral. No pain, crepitus, or limitation noted with foot and ankle range of motion bilateral. Muscular strength 5/5 in all groups tested bilateral.  Gait: Unassisted, Nonantalgic.    Radiographs:  None taken  Assessment & Plan:   Assessment: Ingrown toenail hallux left  Plan: Chemical matricectomy performed today after local anesthetic was administered.  Tolerated procedure well without complications.  She was provided both oral and written home-going structure for the care and soaking of the toe as well as a prescription for Cortisporin Otic to be applied twice daily after soaking.  I would like to follow-up with her in about 1 to 2 weeks questions or concerns she will notify us immediately.     Natnael Biederman T. [place], [place]

## 2021-01-27 ENCOUNTER — Telehealth: Payer: 59 | Admitting: Physician Assistant

## 2021-03-09 ENCOUNTER — Ambulatory Visit: Payer: 59 | Attending: Physician Assistant | Admitting: Critical Care Medicine

## 2021-03-09 ENCOUNTER — Encounter: Payer: Self-pay | Admitting: Critical Care Medicine

## 2021-03-09 ENCOUNTER — Other Ambulatory Visit: Payer: Self-pay

## 2021-03-09 VITALS — BP 104/70 | HR 71 | Resp 16 | Wt 214.0 lb

## 2021-03-09 DIAGNOSIS — E559 Vitamin D deficiency, unspecified: Secondary | ICD-10-CM

## 2021-03-09 DIAGNOSIS — D500 Iron deficiency anemia secondary to blood loss (chronic): Secondary | ICD-10-CM

## 2021-03-09 DIAGNOSIS — E1165 Type 2 diabetes mellitus with hyperglycemia: Secondary | ICD-10-CM | POA: Diagnosis not present

## 2021-03-09 DIAGNOSIS — Z124 Encounter for screening for malignant neoplasm of cervix: Secondary | ICD-10-CM

## 2021-03-09 DIAGNOSIS — N920 Excessive and frequent menstruation with regular cycle: Secondary | ICD-10-CM

## 2021-03-09 DIAGNOSIS — R7989 Other specified abnormal findings of blood chemistry: Secondary | ICD-10-CM

## 2021-03-09 DIAGNOSIS — R3129 Other microscopic hematuria: Secondary | ICD-10-CM | POA: Diagnosis not present

## 2021-03-09 DIAGNOSIS — Z6837 Body mass index (BMI) 37.0-37.9, adult: Secondary | ICD-10-CM

## 2021-03-09 DIAGNOSIS — N3001 Acute cystitis with hematuria: Secondary | ICD-10-CM

## 2021-03-09 LAB — GLUCOSE, POCT (MANUAL RESULT ENTRY): POC Glucose: 115 mg/dl — AB (ref 70–99)

## 2021-03-09 MED ORDER — VITAMIN D (ERGOCALCIFEROL) 1.25 MG (50000 UNIT) PO CAPS: 50000.0000 [IU] | ORAL_CAPSULE | ORAL | 3 refills | Status: DC

## 2021-03-09 MED ORDER — FERROUS SULFATE 325 (65 FE) MG PO TABS: 325.0000 mg | ORAL_TABLET | Freq: Every day | ORAL | 1 refills | Status: DC

## 2021-03-09 MED ORDER — METFORMIN HCL 500 MG PO TABS: ORAL_TABLET | ORAL | 3 refills | Status: DC

## 2021-03-09 MED ORDER — TRUE METRIX BLOOD GLUCOSE TEST VI STRP: ORAL_STRIP | 12 refills | Status: DC

## 2021-03-09 MED ORDER — TRUEPLUS LANCETS 28G MISC: 2 refills | Status: DC

## 2021-03-09 NOTE — Patient Instructions (Signed)
Refills on all your medications sent to your [facility] pharmacy  Labs today include metabolic panel blood counts urine culture urinalysis hemoglobin A1c  Dental resources were given to you to consider please get your teeth cleaned  Referral to [facility] ophthalmology will be made for an eye exam  Referral to gynecology will be made for excess bleeding  Resume iron supplement daily  Return to see Dr. Delford Field in 4 months

## 2021-03-09 NOTE — Progress Notes (Signed)
Established Patient Office Visit  Subjective:  Patient ID: Victoria Schneider, female    DOB: [birth year]  Age: 40 y.o. MRN: [id number]  CC:  Chief Complaint  Patient presents with   Follow-up   Diabetes    HPI Victoria Schneider presents for primary care follow-up visit last seen December 2021.  Patient has been noting periods of blood in the urine without dysuria.  She was seen previously this year in the emergency room for acute cystitis with hematuria.  Treated with a course of antibiotics for E. coli.  Patient does note excess menses with each menstrual period.  Patient does maintain metformin 1000 mg a.m. 500 mg p.m. for type 2 diabetes.  A1c had been controlled previously.  Blood sugar on arrival today 115 patient has also mild iron deficiency anemia from previous lab testing for which she is on iron supplementation. Patient does have mild vitamin D deficiency and is on vitamin D supplement.  Prior history of hepatic steatosis with elevated liver functions for which use of statin was held.  Patient still has significant obesity BMI of nearly 38.  Patient does state her bilateral wrist pain has been improved suspected to be due to carpal tunnel syndrome and doing well with wrist splints at night.  Patient also has hypersomnia with excess snoring.  She was not able to follow through with a referral to sleep medicine.   Past Medical History:  Diagnosis Date   Acute cystitis with hematuria 03/10/2021   Elevated LFTs 06/09/2020   GERD (gastroesophageal reflux disease)    Type 2 diabetes mellitus with hyperglycemia, without long-term current use of insulin ([place]) 05/05/2020    Past Surgical History:  Procedure Laterality Date   NO PAST SURGERIES      Family History  Problem Relation Age of Onset   Diabetes Father    Hypertension Father    Diabetes Brother    Hypertension Mother    Depression Daughter     Social History   Socioeconomic History   Marital status: Single     Spouse name: Not on file   Number of children: Not on file   Years of education: Not on file   Highest education level: Not on file  Occupational History   Not on file  Tobacco Use   Smoking status: Never   Smokeless tobacco: Never  Vaping Use   Vaping Use: Never used  Substance and Sexual Activity   Alcohol use: Not Currently    Comment: rarely   Drug use: No   Sexual activity: Yes    Birth control/protection: None  Other Topics Concern   Not on file  Social History Narrative   Not on file   Social Determinants of Health   Financial Resource Strain: Not on file  Food Insecurity: Not on file  Transportation Needs: Not on file  Physical Activity: Not on file  Stress: Not on file  Social Connections: Not on file  Intimate Partner Violence: Not on file    Outpatient Medications Prior to Visit  Medication Sig Dispense Refill   acetaminophen (TYLENOL) 325 MG tablet Take 650 mg by mouth every 6 (six) hours as needed.     Blood Glucose Monitoring Suppl (TRUE METRIX METER) w/Device KIT 1 each by Does not apply route in the morning and at bedtime. 1 kit 0   fluticasone (FLONASE) 50 MCG/ACT nasal spray PLACE 2 SPRAYS INTO BOTH NOSTRILS DAILY. 16 g 6   ibuprofen (ADVIL) 800 MG  tablet Take 1 tablet (800 mg total) by mouth 3 (three) times daily. 21 tablet 0   loratadine (CLARITIN) 10 MG tablet Take 1 tablet (10 mg total) by mouth daily. 30 tablet 0   NEOMYCIN-POLYMYXIN-HYDROCORTISONE (CORTISPORIN) 1 % SOLN OTIC solution Apply 1-2 drops to toe BID after soaking 10 mL 1   esomeprazole (NEXIUM) 20 MG capsule Take 20 mg by mouth daily at 12 noon.     glucose blood (TRUE METRIX BLOOD GLUCOSE TEST) test strip Use as instructed 100 each 12   metFORMIN (GLUCOPHAGE) 500 MG tablet TAKE 2 TABLETS (1,000 MG TOTAL) BY MOUTH 2 (TWO) TIMES DAILY WITH A MEAL. 120 tablet 2   metFORMIN (GLUCOPHAGE) 500 MG tablet 2 tabs with morning meal; 1 tab with evening meal 90 tablet 3   nitrofurantoin,  macrocrystal-monohydrate, (MACROBID) 100 MG capsule Take 1 capsule (100 mg total) by mouth 2 (two) times daily. 10 capsule 0   TRUEplus Lancets 28G MISC USE AS DIRECTED IN THE MORNING AND AT BEDTIME 100 each 2   Vitamin D, Ergocalciferol, (DRISDOL) 1.25 MG (50000 UNIT) CAPS capsule Take 1 capsule (50,000 Units total) by mouth every 7 (seven) days. 16 capsule 0   ferrous sulfate 325 (65 FE) MG tablet Take 1 tablet (325 mg total) by mouth daily. (Patient not taking: Reported on 03/09/2021) 100 tablet 1   No facility-administered medications prior to visit.    Allergies  Allergen Reactions   Penicillins Rash and Other (See Comments)    Has patient had a PCN reaction causing immediate rash, facial/tongue/throat swelling, SOB or lightheadedness with hypotension: No Has patient had a PCN reaction causing severe rash involving mucus membranes or skin necrosis: No Has patient had a PCN reaction that required hospitalization No Has patient had a PCN reaction occurring within the last 10 years: Yes If all of the above answers are "NO", then may proceed with Cephalosporin use.     ROS Review of Systems  Constitutional:  Negative for chills, diaphoresis and fever.  HENT:  Negative for congestion, hearing loss, nosebleeds, sore throat and tinnitus.   Eyes:  Negative for photophobia and redness.  Respiratory:  Negative for cough, shortness of breath, wheezing and stridor.   Cardiovascular:  Negative for chest pain, palpitations and leg swelling.  Gastrointestinal:  Negative for abdominal pain, blood in stool, constipation, diarrhea, nausea and vomiting.  Endocrine: Negative for polydipsia.  Genitourinary:  Positive for hematuria and menstrual problem. Negative for dysuria, flank pain, frequency and urgency.  Musculoskeletal:  Negative for back pain, myalgias and neck pain.  Skin:  Negative for rash.  Allergic/Immunologic: Negative for environmental allergies.  Neurological:  Negative for dizziness,  tremors, seizures, weakness and headaches.  Hematological:  Does not bruise/bleed easily.  Psychiatric/Behavioral:  Negative for suicidal ideas. The patient is not nervous/anxious.      Objective:    Physical Exam Vitals reviewed.  Constitutional:      Appearance: Normal appearance. She is well-developed. She is obese. She is not diaphoretic.  HENT:     Head: Normocephalic and atraumatic.     Nose: No nasal deformity, septal deviation, mucosal edema or rhinorrhea.     Right Sinus: No maxillary sinus tenderness or frontal sinus tenderness.     Left Sinus: No maxillary sinus tenderness or frontal sinus tenderness.     Mouth/Throat:     Pharynx: No oropharyngeal exudate.  Eyes:     General: No scleral icterus.    Conjunctiva/sclera: Conjunctivae normal.     Pupils:  Pupils are equal, round, and reactive to light.  Neck:     Thyroid: No thyromegaly.     Vascular: No carotid bruit or JVD.     Trachea: Trachea normal. No tracheal tenderness or tracheal deviation.  Cardiovascular:     Rate and Rhythm: Normal rate and regular rhythm.     Chest Wall: PMI is not displaced.     Pulses: Normal pulses. No decreased pulses.     Heart sounds: Normal heart sounds, S1 normal and S2 normal. Heart sounds not distant. No murmur heard. No systolic murmur is present.  No diastolic murmur is present.    No friction rub. No gallop. No S3 or S4 sounds.  Pulmonary:     Effort: Pulmonary effort is normal. No tachypnea, accessory muscle usage or respiratory distress.     Breath sounds: Normal breath sounds. No stridor. No decreased breath sounds, wheezing, rhonchi or rales.  Chest:     Chest wall: No tenderness.  Abdominal:     General: Bowel sounds are normal. There is no distension.     Palpations: Abdomen is soft. Abdomen is not rigid.     Tenderness: There is no abdominal tenderness. There is no guarding or rebound.  Musculoskeletal:        General: Normal range of motion.     Cervical back:  Normal range of motion and neck supple. No edema, erythema or rigidity. No muscular tenderness. Normal range of motion.  Lymphadenopathy:     Head:     Right side of head: No submental or submandibular adenopathy.     Left side of head: No submental or submandibular adenopathy.     Cervical: No cervical adenopathy.  Skin:    General: Skin is warm and dry.     Coloration: Skin is not pale.     Findings: No rash.     Nails: There is no clubbing.  Neurological:     Mental Status: She is alert and oriented to person, place, and time.     Sensory: No sensory deficit.  Psychiatric:        Mood and Affect: Mood normal.        Speech: Speech normal.        Behavior: Behavior normal.        Thought Content: Thought content normal.    BP 104/70   Pulse 71   Resp 16   Wt 214 lb (97.1 kg)   SpO2 96%   BMI 37.91 kg/m  Wt Readings from Last 3 Encounters:  03/09/21 214 lb (97.1 kg)  10/14/20 217 lb 9.6 oz (98.7 kg)  06/09/20 227 lb (103 kg)     Health Maintenance Due  Topic Date Due   OPHTHALMOLOGY EXAM  Never done   PAP SMEAR-Modifier  Never done    There are no preventive care reminders to display for this patient.  Lab Results  Component Value Date   TSH 2.670 10/14/2020   Lab Results  Component Value Date   WBC 7.5 03/09/2021   HGB 10.3 (L) 03/09/2021   HCT 34.6 03/09/2021   MCV 75 (L) 03/09/2021   PLT 309 03/09/2021   Lab Results  Component Value Date   NA 140 03/09/2021   K 4.7 03/09/2021   CO2 24 03/09/2021   GLUCOSE 111 (H) 03/09/2021   BUN 5 (L) 03/09/2021   CREATININE 0.59 03/09/2021   BILITOT 0.6 03/09/2021   ALKPHOS 135 (H) 03/09/2021   AST 25 03/09/2021   ALT  27 03/09/2021   PROT 7.2 03/09/2021   ALBUMIN 4.1 03/09/2021   CALCIUM 9.3 03/09/2021   ANIONGAP 9 07/22/2017   EGFR 116 03/09/2021   Lab Results  Component Value Date   CHOL 175 05/26/2020   Lab Results  Component Value Date   HDL 56 05/26/2020   Lab Results  Component Value Date    LDLCALC 102 (H) 05/26/2020   Lab Results  Component Value Date   TRIG 93 05/26/2020   Lab Results  Component Value Date   CHOLHDL 3.1 05/26/2020   Lab Results  Component Value Date   HGBA1C 6.2 (H) 03/09/2021      Assessment & Plan:   Problem List Items Addressed This Visit       Endocrine   Type 2 diabetes mellitus with hyperglycemia, without long-term current use of insulin (HCC) - Primary    Type 2 diabetes with blood sugar well controlled at this visit and A1c 6.2  Continue metformin as prescribed      Relevant Medications   glucose blood (TRUE METRIX BLOOD GLUCOSE TEST) test strip   metFORMIN (GLUCOPHAGE) 500 MG tablet   TRUEplus Lancets 28G MISC   Other Relevant Orders   POCT glucose (manual entry) (Completed)   Comprehensive metabolic panel (Completed)   Ambulatory referral to Ophthalmology   Hemoglobin A1c (Completed)     Genitourinary   RESOLVED: Other microscopic hematuria    Hematuria with probable acute cystitis in the past however currently urinalysis is completely normal  No indication for antibiotics      RESOLVED: Acute cystitis with hematuria    History of acute cystitis with hematuria and E. coli urine infection previously current urinalysis is clear no indication for antibiotics      Relevant Orders   CBC with Differential/Platelet (Completed)   Urinalysis (Completed)   Urine Culture     Other   Cervical cancer screening    Will refer to gynecology for excess bleeding during periods and Pap smear      Relevant Orders   Ambulatory referral to Gynecology   Menorrhagia with regular cycle    As per cervical cancer screening assessment      Relevant Orders   Ambulatory referral to Gynecology   Iron deficiency anemia due to chronic blood loss    Mild iron deficiency anemia in the past with excess bleeding during periods current blood count shows mild anemia  Plan iron supplement      Relevant Medications   ferrous sulfate 325  (65 FE) MG tablet   Other Relevant Orders   Ambulatory referral to Gynecology   CBC with Differential/Platelet (Completed)   BMI 37.0-37.9, adult    Patient given dietary and exercise instruction       Vitamin D deficiency    Recommend refill and resume vitamin D 50,000 units weekly      RESOLVED: Elevated LFTs    History elevated liver functions currently have resolved       Meds ordered this encounter  Medications   ferrous sulfate 325 (65 FE) MG tablet    Sig: Take 1 tablet (325 mg total) by mouth daily.    Dispense:  100 tablet    Refill:  1   glucose blood (TRUE METRIX BLOOD GLUCOSE TEST) test strip    Sig: Use as instructed    Dispense:  100 each    Refill:  12   metFORMIN (GLUCOPHAGE) 500 MG tablet    Sig: 2 tabs with morning meal; 1  tab with evening meal    Dispense:  90 tablet    Refill:  3   TRUEplus Lancets 28G MISC    Sig: USE AS DIRECTED IN THE MORNING AND AT BEDTIME    Dispense:  100 each    Refill:  2   Vitamin D, Ergocalciferol, (DRISDOL) 1.25 MG (50000 UNIT) CAPS capsule    Sig: Take 1 capsule (50,000 Units total) by mouth every 7 (seven) days.    Dispense:  16 capsule    Refill:  3   Recommended patient obtain dental exam and dental cleansing also recommend patient obtain an eye exam    Follow-up: Return in about 4 months (around 07/09/2021).    Asencion Noble, MD

## 2021-03-10 ENCOUNTER — Encounter: Payer: Self-pay | Admitting: Critical Care Medicine

## 2021-03-10 ENCOUNTER — Telehealth: Payer: Self-pay

## 2021-03-10 DIAGNOSIS — D500 Iron deficiency anemia secondary to blood loss (chronic): Secondary | ICD-10-CM | POA: Insufficient documentation

## 2021-03-10 DIAGNOSIS — R3129 Other microscopic hematuria: Secondary | ICD-10-CM | POA: Insufficient documentation

## 2021-03-10 DIAGNOSIS — E559 Vitamin D deficiency, unspecified: Secondary | ICD-10-CM | POA: Insufficient documentation

## 2021-03-10 DIAGNOSIS — Z124 Encounter for screening for malignant neoplasm of cervix: Secondary | ICD-10-CM | POA: Insufficient documentation

## 2021-03-10 DIAGNOSIS — N920 Excessive and frequent menstruation with regular cycle: Secondary | ICD-10-CM | POA: Insufficient documentation

## 2021-03-10 DIAGNOSIS — Z6837 Body mass index (BMI) 37.0-37.9, adult: Secondary | ICD-10-CM | POA: Insufficient documentation

## 2021-03-10 DIAGNOSIS — N3001 Acute cystitis with hematuria: Secondary | ICD-10-CM

## 2021-03-10 HISTORY — DX: Acute cystitis with hematuria: N30.01

## 2021-03-10 LAB — URINALYSIS
Bilirubin, UA: NEGATIVE
Glucose, UA: NEGATIVE
Ketones, UA: NEGATIVE
Leukocytes,UA: NEGATIVE
Nitrite, UA: NEGATIVE
Protein,UA: NEGATIVE
RBC, UA: NEGATIVE
Specific Gravity, UA: 1.02 (ref 1.005–1.030)
Urobilinogen, Ur: 0.2 mg/dL (ref 0.2–1.0)
pH, UA: 6 (ref 5.0–7.5)

## 2021-03-10 LAB — CBC WITH DIFFERENTIAL/PLATELET
Basophils Absolute: 0 10*3/uL (ref 0.0–0.2)
Basos: 1 %
EOS (ABSOLUTE): 0.1 10*3/uL (ref 0.0–0.4)
Eos: 2 %
Hematocrit: 34.6 % (ref 34.0–46.6)
Hemoglobin: 10.3 g/dL — ABNORMAL LOW (ref 11.1–15.9)
Immature Grans (Abs): 0 10*3/uL (ref 0.0–0.1)
Immature Granulocytes: 0 %
Lymphocytes Absolute: 3.2 10*3/uL — ABNORMAL HIGH (ref 0.7–3.1)
Lymphs: 43 %
MCH: 22.2 pg — ABNORMAL LOW (ref 26.6–33.0)
MCHC: 29.8 g/dL — ABNORMAL LOW (ref 31.5–35.7)
MCV: 75 fL — ABNORMAL LOW (ref 79–97)
Monocytes Absolute: 0.7 10*3/uL (ref 0.1–0.9)
Monocytes: 9 %
Neutrophils Absolute: 3.5 10*3/uL (ref 1.4–7.0)
Neutrophils: 45 %
Platelets: 309 10*3/uL (ref 150–450)
RBC: 4.64 x10E6/uL (ref 3.77–5.28)
RDW: 16 % — ABNORMAL HIGH (ref 11.7–15.4)
WBC: 7.5 10*3/uL (ref 3.4–10.8)

## 2021-03-10 LAB — COMPREHENSIVE METABOLIC PANEL
ALT: 27 IU/L (ref 0–32)
AST: 25 IU/L (ref 0–40)
Albumin/Globulin Ratio: 1.3 (ref 1.2–2.2)
Albumin: 4.1 g/dL (ref 3.8–4.8)
Alkaline Phosphatase: 135 IU/L — ABNORMAL HIGH (ref 44–121)
BUN/Creatinine Ratio: 8 — ABNORMAL LOW (ref 9–23)
BUN: 5 mg/dL — ABNORMAL LOW (ref 6–24)
Bilirubin Total: 0.6 mg/dL (ref 0.0–1.2)
CO2: 24 mmol/L (ref 20–29)
Calcium: 9.3 mg/dL (ref 8.7–10.2)
Chloride: 104 mmol/L (ref 96–106)
Creatinine, Ser: 0.59 mg/dL (ref 0.57–1.00)
Globulin, Total: 3.1 g/dL (ref 1.5–4.5)
Glucose: 111 mg/dL — ABNORMAL HIGH (ref 65–99)
Potassium: 4.7 mmol/L (ref 3.5–5.2)
Sodium: 140 mmol/L (ref 134–144)
Total Protein: 7.2 g/dL (ref 6.0–8.5)
eGFR: 116 mL/min/{1.73_m2} (ref >59)

## 2021-03-10 LAB — HEMOGLOBIN A1C
Est. average glucose Bld gHb Est-mCnc: 131 mg/dL
Hgb A1c MFr Bld: 6.2 % — ABNORMAL HIGH (ref 4.8–5.6)

## 2021-03-10 NOTE — Telephone Encounter (Signed)
Contacted pt to go over lab results pt didn't answer lvm   Sent a CRM and forward labs to NT to give pt labs when they call back   

## 2021-03-10 NOTE — Assessment & Plan Note (Signed)
As per cervical cancer screening assessment

## 2021-03-10 NOTE — Assessment & Plan Note (Signed)
Type 2 diabetes with blood sugar well controlled at this visit and A1c 6.2  Continue metformin as prescribed

## 2021-03-10 NOTE — Assessment & Plan Note (Signed)
Will refer to gynecology for excess bleeding during periods and Pap smear

## 2021-03-10 NOTE — Assessment & Plan Note (Signed)
Hematuria with probable acute cystitis in the past however currently urinalysis is completely normal  No indication for antibiotics

## 2021-03-10 NOTE — Assessment & Plan Note (Signed)
Recommend refill and resume vitamin D 50,000 units weekly

## 2021-03-10 NOTE — Assessment & Plan Note (Signed)
History elevated liver functions currently have resolved

## 2021-03-10 NOTE — Assessment & Plan Note (Signed)
History of acute cystitis with hematuria and E. coli urine infection previously current urinalysis is clear no indication for antibiotics

## 2021-03-10 NOTE — Assessment & Plan Note (Signed)
Patient given dietary and exercise instruction

## 2021-03-10 NOTE — Assessment & Plan Note (Signed)
Mild iron deficiency anemia in the past with excess bleeding during periods current blood count shows mild anemia  Plan iron supplement

## 2021-03-11 ENCOUNTER — Ambulatory Visit: Payer: Self-pay | Admitting: *Deleted

## 2021-03-11 NOTE — Telephone Encounter (Signed)
Pt given lab results per notes of Dr. Delford Field from 03/10/21 on 03/11/21. Pt verbalized understanding and would like confirmation if patient is to make her GYN appt or if [facility] makes her appt with GYN. Encouraged patient to make her own appt and let [facility] know when it has been made. Please confirm and f/u with patient.

## 2021-03-12 LAB — URINE CULTURE

## 2021-03-14 NOTE — Telephone Encounter (Signed)
Referral was placed on 9/21   Placed in [facility]   [address] [place], [place] [address] Ph# [phone]  Contacted pt and made aware and provided phone number to call and scheduled

## 2021-06-26 ENCOUNTER — Other Ambulatory Visit: Payer: Self-pay

## 2021-06-26 ENCOUNTER — Ambulatory Visit
Admission: EM | Admit: 2021-06-26 | Discharge: 2021-06-26 | Disposition: A | Discharge status: Home / Self Care | Payer: 59

## 2021-07-10 NOTE — Progress Notes (Incomplete)
Established Patient Office Visit  Subjective:  Patient ID: Victoria Schneider, female    DOB: 1980/07/29  Age: 41 y.o. MRN: [id number]  CC: No chief complaint on file.   HPI Victoria Schneider presents for  Pap, urin alb foot 02/2021 Victoria Schneider presents for primary care follow-up visit last seen December 2021.  Patient has been noting periods of blood in the urine without dysuria.  She was seen previously this year in the emergency room for acute cystitis with hematuria.  Treated with a course of antibiotics for E. coli.  Patient does note excess menses with each menstrual period.  Patient does maintain metformin 1000 mg a.m. 500 mg p.m. for type 2 diabetes.  A1c had been controlled previously.  Blood sugar on arrival today 115 patient has also mild iron deficiency anemia from previous lab testing for which she is on iron supplementation. Patient does have mild vitamin D deficiency and is on vitamin D supplement.  Prior history of hepatic steatosis with elevated liver functions for which use of statin was held.  Patient still has significant obesity BMI of nearly 38.  Patient does state her bilateral wrist pain has been improved suspected to be due to carpal tunnel syndrome and doing well with wrist splints at night.  Patient also has hypersomnia with excess snoring.  She was not able to follow through with a referral to sleep medicine.   Type 2 diabetes with blood sugar well controlled at this visit and A1c 6.2   Continue metformin as prescribed         Relevant Medications    glucose blood (TRUE METRIX BLOOD GLUCOSE TEST) test strip    metFORMIN (GLUCOPHAGE) 500 MG tablet    TRUEplus Lancets 28G MISC    Other Relevant Orders    POCT glucose (manual entry) (Completed)    Comprehensive metabolic panel (Completed)    Ambulatory referral to Ophthalmology    Hemoglobin A1c (Completed)        Genitourinary    RESOLVED: Other microscopic hematuria      Hematuria with  probable acute cystitis in the past however currently urinalysis is completely normal   No indication for antibiotics        RESOLVED: Acute cystitis with hematuria      History of acute cystitis with hematuria and E. coli urine infection previously current urinalysis is clear no indication for antibiotics        Relevant Orders    CBC with Differential/Platelet (Completed)    Urinalysis (Completed)    Urine Culture        Other    Cervical cancer screening      Will refer to gynecology for excess bleeding during periods and Pap smear        Relevant Orders    Ambulatory referral to Gynecology    Menorrhagia with regular cycle      As per cervical cancer screening assessment        Relevant Orders    Ambulatory referral to Gynecology    Iron deficiency anemia due to chronic blood loss      Mild iron deficiency anemia in the past with excess bleeding during periods current blood count shows mild anemia   Plan iron supplement        Relevant Medications    ferrous sulfate 325 (65 FE) MG tablet    Other Relevant Orders    Ambulatory referral to Gynecology    CBC with  Differential/Platelet (Completed)    BMI 37.0-37.9, adult      Patient given dietary and exercise instruction          Vitamin D deficiency      Recommend refill and resume vitamin D 50,000 units weekly        RESOLVED: Elevated LFTs      History elevated liver functions currently have resolved        Past Medical History:  Diagnosis Date   Acute cystitis with hematuria 03/10/2021   Elevated LFTs 06/09/2020   GERD (gastroesophageal reflux disease)    Type 2 diabetes mellitus with hyperglycemia, without long-term current use of insulin ([place]) 05/05/2020    Past Surgical History:  Procedure Laterality Date   NO PAST SURGERIES      Family History  Problem Relation Age of Onset   Diabetes Father    Hypertension Father    Diabetes Brother    Hypertension Mother    Depression Daughter      Social History   Socioeconomic History   Marital status: Single    Spouse name: Not on file   Number of children: Not on file   Years of education: Not on file   Highest education level: Not on file  Occupational History   Not on file  Tobacco Use   Smoking status: Never   Smokeless tobacco: Never  Vaping Use   Vaping Use: Never used  Substance and Sexual Activity   Alcohol use: Not Currently    Comment: rarely   Drug use: No   Sexual activity: Yes    Birth control/protection: None  Other Topics Concern   Not on file  Social History Narrative   Not on file   Social Determinants of Health   Financial Resource Strain: Not on file  Food Insecurity: Not on file  Transportation Needs: Not on file  Physical Activity: Not on file  Stress: Not on file  Social Connections: Not on file  Intimate Partner Violence: Not on file    Outpatient Medications Prior to Visit  Medication Sig Dispense Refill   acetaminophen (TYLENOL) 325 MG tablet Take 650 mg by mouth every 6 (six) hours as needed.     Blood Glucose Monitoring Suppl (TRUE METRIX METER) w/Device KIT 1 each by Does not apply route in the morning and at bedtime. 1 kit 0   ferrous sulfate 325 (65 FE) MG tablet Take 1 tablet (325 mg total) by mouth daily. 100 tablet 1   fluticasone (FLONASE) 50 MCG/ACT nasal spray PLACE 2 SPRAYS INTO BOTH NOSTRILS DAILY. 16 g 6   glucose blood (TRUE METRIX BLOOD GLUCOSE TEST) test strip Use as instructed 100 each 12   ibuprofen (ADVIL) 800 MG tablet Take 1 tablet (800 mg total) by mouth 3 (three) times daily. 21 tablet 0   loratadine (CLARITIN) 10 MG tablet Take 1 tablet (10 mg total) by mouth daily. 30 tablet 0   metFORMIN (GLUCOPHAGE) 500 MG tablet 2 tabs with morning meal; 1 tab with evening meal 90 tablet 3   NEOMYCIN-POLYMYXIN-HYDROCORTISONE (CORTISPORIN) 1 % SOLN OTIC solution Apply 1-2 drops to toe BID after soaking 10 mL 1   TRUEplus Lancets 28G MISC USE AS  DIRECTED IN THE MORNING AND AT BEDTIME 100 each 2   Vitamin D, Ergocalciferol, (DRISDOL) 1.25 MG (50000 UNIT) CAPS capsule Take 1 capsule (50,000 Units total) by mouth every 7 (seven) days. 16 capsule 3   No facility-administered medications prior to visit.    Allergies  Allergen Reactions   Penicillins Rash and Other (See Comments)    Has patient had a PCN reaction causing immediate rash, facial/tongue/throat swelling, SOB or lightheadedness with hypotension: No Has patient had a PCN reaction causing severe rash involving mucus membranes or skin necrosis: No Has patient had a PCN reaction that required hospitalization No Has patient had a PCN reaction occurring within the last 10 years: Yes If all of the above answers are "NO", then may proceed with Cephalosporin use.     ROS Review of Systems    Objective:    Physical Exam  There were no vitals taken for this visit. Wt Readings from Last 3 Encounters:  03/09/21 214 lb (97.1 kg)  10/14/20 217 lb 9.6 oz (98.7 kg)  06/09/20 227 lb (103 kg)     Health Maintenance Due  Topic Date Due   OPHTHALMOLOGY EXAM  Never done   PAP SMEAR-Modifier  Never done   URINE MICROALBUMIN  05/26/2021   FOOT EXAM  06/09/2021    There are no preventive care reminders to display for this patient.  Lab Results  Component Value Date   TSH 2.670 10/14/2020   Lab Results  Component Value Date   WBC 7.5 03/09/2021   HGB 10.3 (L) 03/09/2021   HCT 34.6 03/09/2021   MCV 75 (L) 03/09/2021   PLT 309 03/09/2021   Lab Results  Component Value Date   NA 140 03/09/2021   K 4.7 03/09/2021   CO2 24 03/09/2021   GLUCOSE 111 (H) 03/09/2021   BUN 5 (L) 03/09/2021   CREATININE 0.59 03/09/2021   BILITOT 0.6 03/09/2021   ALKPHOS 135 (H) 03/09/2021   AST 25 03/09/2021   ALT 27 03/09/2021   PROT 7.2 03/09/2021   ALBUMIN 4.1 03/09/2021   CALCIUM 9.3 03/09/2021   ANIONGAP 9 07/22/2017   EGFR 116 03/09/2021   Lab Results  Component Value  Date   CHOL 175 05/26/2020   Lab Results  Component Value Date   HDL 56 05/26/2020   Lab Results  Component Value Date   LDLCALC 102 (H) 05/26/2020   Lab Results  Component Value Date   TRIG 93 05/26/2020   Lab Results  Component Value Date   CHOLHDL 3.1 05/26/2020   Lab Results  Component Value Date   HGBA1C 6.2 (H) 03/09/2021      Assessment & Plan:   Problem List Items Addressed This Visit   None   No orders of the defined types were placed in this encounter.   Follow-up: No follow-ups on file.    Asencion Noble, MD

## 2021-07-11 ENCOUNTER — Ambulatory Visit: Payer: 59 | Admitting: Critical Care Medicine

## 2021-08-02 ENCOUNTER — Ambulatory Visit: Payer: Self-pay | Admitting: *Deleted

## 2021-08-02 NOTE — Telephone Encounter (Signed)
°  Chief Complaint: back pain Symptoms: R back and side pain Frequency: 5 days Pertinent Negatives: Patient denies fever, abdominal pain, burning with urination, blood in urine Disposition: [] ED /[x] Urgent Care (no appt availability in office) / [] Appointment(In office/virtual)/ []  [facility]/ [] Home Care/ [] Refused Recommended Disposition /[] [place] [facility]/ []  Follow-up with PCP Additional Notes:

## 2021-08-02 NOTE — Telephone Encounter (Signed)
Reason for Disposition  [1] MODERATE back pain (e.g., interferes with normal activities) AND [2] present > 3 days  Answer Assessment - Initial Assessment Questions 1. ONSET: "When did the pain begin?"      5 days 2. LOCATION: "Where does it hurt?" (upper, mid or lower back)     R side/back pain 3. SEVERITY: "How bad is the pain?"  (e.g., Scale 1-10; mild, moderate, or severe)   - MILD (1-3): doesn't interfere with normal activities    - MODERATE (4-7): interferes with normal activities or awakens from sleep    - SEVERE (8-10): excruciating pain, unable to do any normal activities      moderate 4. PATTERN: "Is the pain constant?" (e.g., yes, no; constant, intermittent)      Comes and goes 5. RADIATION: "Does the pain shoot into your legs or elsewhere?"     Upper back to side 6. CAUSE:  "What do you think is causing the back pain?"      lifting 7. BACK OVERUSE:  "Any recent lifting of heavy objects, strenuous work or exercise?"     Moving heavy object 8. MEDICATIONS: "What have you taken so far for the pain?" (e.g., nothing, acetaminophen, NSAIDS)     Ibuprofen  9. NEUROLOGIC SYMPTOMS: "Do you have any weakness, numbness, or problems with bowel/bladder control?"     *No Answer* 10. OTHER SYMPTOMS: "Do you have any other symptoms?" (e.g., fever, abdominal pain, burning with urination, blood in urine)       no 11. PREGNANCY: "Is there any chance you are pregnant?" (e.g., yes, no; LMP)       *No Answer*  Protocols used: Back Pain-A-AH

## 2021-08-03 NOTE — Telephone Encounter (Signed)
Ok to add her on as virtual visit tomorrow at [facility] [employer]

## 2021-08-03 NOTE — Telephone Encounter (Signed)
Fyi can she do a virtual with [place] or with different office/doctor?

## 2021-08-04 NOTE — Telephone Encounter (Signed)
Called pt and apologized about note, I suggested the mmu for next week and she was in agreement. Let pt know that Yvonna Alanis will be giving her a call for her appointment.

## 2021-08-25 ENCOUNTER — Encounter: Payer: Self-pay | Admitting: Physician Assistant

## 2021-08-25 ENCOUNTER — Other Ambulatory Visit: Payer: Self-pay

## 2021-08-25 ENCOUNTER — Ambulatory Visit: Payer: 59 | Attending: Physician Assistant | Admitting: Physician Assistant

## 2021-08-25 VITALS — BP 126/77 | HR 80 | Resp 18 | Ht 63.0 in | Wt 216.1 lb

## 2021-08-25 DIAGNOSIS — E559 Vitamin D deficiency, unspecified: Secondary | ICD-10-CM

## 2021-08-25 DIAGNOSIS — E1165 Type 2 diabetes mellitus with hyperglycemia: Secondary | ICD-10-CM

## 2021-08-25 DIAGNOSIS — T7840XD Allergy, unspecified, subsequent encounter: Secondary | ICD-10-CM

## 2021-08-25 DIAGNOSIS — M545 Low back pain, unspecified: Secondary | ICD-10-CM

## 2021-08-25 DIAGNOSIS — G8929 Other chronic pain: Secondary | ICD-10-CM

## 2021-08-25 DIAGNOSIS — R3129 Other microscopic hematuria: Secondary | ICD-10-CM

## 2021-08-25 DIAGNOSIS — H6983 Other specified disorders of Eustachian tube, bilateral: Secondary | ICD-10-CM

## 2021-08-25 DIAGNOSIS — H6982 Other specified disorders of Eustachian tube, left ear: Secondary | ICD-10-CM | POA: Diagnosis not present

## 2021-08-25 DIAGNOSIS — D500 Iron deficiency anemia secondary to blood loss (chronic): Secondary | ICD-10-CM

## 2021-08-25 LAB — POCT URINALYSIS DIP (CLINITEK)
Bilirubin, UA: NEGATIVE
Glucose, UA: NEGATIVE mg/dL
Ketones, POC UA: NEGATIVE mg/dL
Leukocytes, UA: NEGATIVE
Nitrite, UA: NEGATIVE
Spec Grav, UA: >=1.03 — AB (ref 1.010–1.025)
Urobilinogen, UA: 0.2 E.U./dL
pH, UA: 6 (ref 5.0–8.0)

## 2021-08-25 LAB — POCT GLYCOSYLATED HEMOGLOBIN (HGB A1C): Hemoglobin A1C: 6.2 % — AB (ref 4.0–5.6)

## 2021-08-25 LAB — GLUCOSE, POCT (MANUAL RESULT ENTRY): POC Glucose: 172 mg/dl — AB (ref 70–99)

## 2021-08-25 MED ORDER — METHOCARBAMOL 500 MG PO TABS: 1000.0000 mg | ORAL_TABLET | Freq: Three times a day (TID) | ORAL | 0 refills | Status: DC | PRN

## 2021-08-25 MED ORDER — FERROUS SULFATE 325 (65 FE) MG PO TABS: 325.0000 mg | ORAL_TABLET | Freq: Every day | ORAL | 1 refills | Status: DC

## 2021-08-25 MED ORDER — FLUTICASONE PROPIONATE 50 MCG/ACT NA SUSP: 2.0000 | Freq: Every day | NASAL | 6 refills | Status: DC

## 2021-08-25 MED ORDER — METFORMIN HCL 500 MG PO TABS: ORAL_TABLET | ORAL | 3 refills | Status: DC

## 2021-08-25 MED ORDER — IBUPROFEN 800 MG PO TABS: 800.0000 mg | ORAL_TABLET | Freq: Three times a day (TID) | ORAL | 0 refills | Status: DC

## 2021-08-25 MED ORDER — CETIRIZINE-PSEUDOEPHEDRINE ER 5-120 MG PO TB12: 1.0000 | ORAL_TABLET | Freq: Two times a day (BID) | ORAL | 1 refills | Status: DC

## 2021-08-25 NOTE — Progress Notes (Signed)
Patient ID: Victoria Schneider, female   DOB: May 15, 1981, 41 y.o.   MRN: [id number] ? ? ?[facility], is a 41 y.o. female ? ?[id number] ? ?[id number] ? ?DOB - 1980-06-21 ? ?Chief Complaint  ?Patient presents with  ? Diabetes  ?    ? ?Subjective:  ? ?Victoria Schneider is a 41 y.o. female here today for multiple issues including diabetes check.  She is prescribed 1525m metformin daily but has only been taking 10012mat bedtime. ? ?She has been having pain in the R side of her back intermittently since helping a friend move furniture a couple of months ago.  Has not taken any OTC for it.   ? ?Also having "blood in my urine" after her period.  Denies any UTI a/sx.  Says it is on the TP when she wipes(difficult to tell if it is actually from the urine or residual vaginal blood.  No vaginal discharge.  No pelvic pain ? ?Also has congestion in ears and nose with allergies acting up.   ?No problems updated. ? ?ALLERGIES: ?Allergies  ?Allergen Reactions  ? Penicillins Rash and Other (See Comments)  ?  Has patient had a PCN reaction causing immediate rash, facial/tongue/throat swelling, SOB or lightheadedness with hypotension: No ?Has patient had a PCN reaction causing severe rash involving mucus membranes or skin necrosis: No ?Has patient had a PCN reaction that required hospitalization No ?Has patient had a PCN reaction occurring within the last 10 years: Yes ?If all of the above answers are "NO", then may proceed with Cephalosporin use. ?  ? ? ?PAST MEDICAL HISTORY: ?Past Medical History:  ?Diagnosis Date  ? Acute cystitis with hematuria 03/10/2021  ? Elevated LFTs 06/09/2020  ? GERD (gastroesophageal reflux disease)   ? Type 2 diabetes mellitus with hyperglycemia, without long-term current use of insulin ([place]/17/2021  ? ? ?MEDICATIONS AT HOME: ?Prior to Admission medications   ?Medication Sig Start Date End Date Taking? Authorizing Provider  ?acetaminophen (TYLENOL) 325 MG tablet Take 650 mg by mouth  every 6 (six) hours as needed.   Yes [provider]  ?Blood Glucose Monitoring Suppl (TRUE METRIX METER) w/Device KIT 1 each by Does not apply route in the morning and at bedtime. 05/05/20  Yes McArgentina DonovanPA-C  ?cetirizine-pseudoephedrine (ZYRTEC-D) 5-120 MG tablet Take 1 tablet by mouth 2 (two) times daily. prn 08/25/21  Yes Annistyn Depass, AnDionne BucyPA-C  ?glucose blood (TRUE METRIX BLOOD GLUCOSE TEST) test strip Use as instructed 03/09/21  Yes WrElsie StainMD  ?methocarbamol (ROBAXIN) 500 MG tablet Take 2 tablets (1,000 mg total) by mouth every 8 (eight) hours as needed for muscle spasms. 08/25/21  Yes McFreeman Caldron, PA-C  ?NEOMYCIN-POLYMYXIN-HYDROCORTISONE (CORTISPORIN) 1 % SOLN OTIC solution Apply 1-2 drops to toe BID after soaking 01/20/21  Yes Hyatt, Max T, DPM  ?TRUEplus Lancets 28G MISC USE AS DIRECTED IN THE MORNING AND AT BEDTIME 03/09/21 03/09/22 Yes WrElsie StainMD  ?Vitamin D, Ergocalciferol, (DRISDOL) 1.25 MG (50000 UNIT) CAPS capsule Take 1 capsule (50,000 Units total) by mouth every 7 (seven) days. 03/09/21  Yes WrElsie StainMD  ?ferrous sulfate 325 (65 FE) MG tablet Take 1 tablet (325 mg total) by mouth daily. 08/25/21   McArgentina DonovanPA-C  ?fluticasone (FLONASE) 50 MCG/ACT nasal spray PLACE 2 SPRAYS INTO BOTH NOSTRILS DAILY. 08/25/21 08/25/22  McArgentina DonovanPA-C  ?ibuprofen (ADVIL) 800 MG tablet Take 1 tablet (800 mg total) by mouth 3 (  three) times daily. Prn pain 08/25/21   Argentina Donovan, PA-C  ?metFORMIN (GLUCOPHAGE) 500 MG tablet 2 tabs daily with dinner 08/25/21   Argentina Donovan, PA-C  ?albuterol (VENTOLIN HFA) 108 (90 Base) MCG/ACT inhaler Inhale 1-2 puffs into the lungs every 6 (six) hours as needed for wheezing or shortness of breath. ?Patient not taking: Reported on 08/06/2019 07/15/19 02/09/20  Hall-Potvin, [place], PA-C  ?famotidine (PEPCID) 20 MG tablet One after supper ?Patient not taking: Reported on 08/06/2019 01/23/19 02/09/20  Tanda Rockers, MD   ? ? ?ROS: ?Neg resp ?Neg cardiac ?Neg GI ?Neg MS ?Neg psych ?Neg neuro ? ?Objective:  ? ?Vitals:  ? 08/25/21 0934  ?BP: 126/77  ?Pulse: 80  ?Resp: 18  ?SpO2: 94%  ?Weight: 216 lb 2 oz (98 kg)  ?Height: _0  (1.6 m)  ? ?Exam ?General appearance : Awake, alert, not in any distress. Speech Clear. Not toxic looking ?HEENT: Atraumatic and Normocephalic.  B TM congested but no erythema.  No cerumen build up ?Neck: Supple, no JVD. No cervical lymphadenopathy.  ?Chest: Good air entry bilaterally, CTAB.  No rales/rhonchi/wheezing ?CVS: S1 S2 regular, no murmurs.  ?Back-full s&ROM.  Neg slr B ?Extremities: B/L Lower Ext shows no edema, both legs are warm to touch ?Neurology: Awake alert, and oriented X 3, CN II-XII intact, Non focal ?Skin: No Rash ? ?Data Review ?Lab Results  ?Component Value Date  ? HGBA1C 6.2 (A) 08/25/2021  ? HGBA1C 6.2 (H) 03/09/2021  ? HGBA1C 6.3 10/14/2020  ? ? ?Assessment & Plan  ? ?1. Type 2 diabetes mellitus with hyperglycemia, without long-term current use of insulin ([place]) ?Controlled on the dose she is actually taking which I will send prescription she is currently taking it now ?- Glucose (CBG) ?- HgB A1c ?- POCT URINALYSIS DIP (CLINITEK) ?- Comprehensive metabolic panel ?- metFORMIN (GLUCOPHAGE) 500 MG tablet; 2 tabs daily with dinner  Dispense: 60 tablet; Refill: 3 ? ?2. Other microscopic hematuria ?No infection-increase water intake to 80-100 ounces water daily ?- POCT URINALYSIS DIP (CLINITEK) ?- Ambulatory referral to Urology ? ?3. Dysfunction of both eustachian tubes ?Flonase sent ?- fluticasone (FLONASE) 50 MCG/ACT nasal spray; PLACE 2 SPRAYS INTO BOTH NOSTRILS DAILY.  Dispense: 16 g; Refill: 6 ? ?4. Iron deficiency anemia due to chronic blood loss ?- ferrous sulfate 325 (65 FE) MG tablet; Take 1 tablet (325 mg total) by mouth daily.  Dispense: 100 tablet; Refill: 1 ?- CBC with Differential/Platelet ? ?6. Allergy, subsequent encounter ?- cetirizine-pseudoephedrine (ZYRTEC-D) 5-120 MG  tablet; Take 1 tablet by mouth 2 (two) times daily. prn  Dispense: 60 tablet; Refill: 1 ?- fluticasone (FLONASE) 50 MCG/ACT nasal spray; PLACE 2 SPRAYS INTO BOTH NOSTRILS DAILY.  Dispense: 16 g; Refill: 6 ? ?7. Vitamin D deficiency ?- Vitamin D, 25-hydroxy ? ?8. Chronic right-sided low back pain without sciatica ?- ibuprofen (ADVIL) 800 MG tablet; Take 1 tablet (800 mg total) by mouth 3 (three) times daily. Prn pain  Dispense: 30 tablet; Refill: 0 ?- methocarbamol (ROBAXIN) 500 MG tablet; Take 2 tablets (1,000 mg total) by mouth every 8 (eight) hours as needed for muscle spasms.  Dispense: 90 tablet; Refill: 0 ? ? ? ?Patient have been counseled extensively about nutrition and exercise. Other issues discussed during this visit include: low cholesterol diet, weight control and daily exercise, foot care, annual eye examinations at Ophthalmology, importance of adherence with medications and regular follow-up. We also discussed long term complications of uncontrolled diabetes and hypertension.  ? ?Return  in about 3 months (around 11/25/2021) for PCP for chronic conditions. ? ?The patient was given clear instructions to go to ER or return to medical center if symptoms don't improve, worsen or new problems develop. The patient verbalized understanding. The patient was told to call to get lab results if they haven't heard anything in the next week.  ? ? ? ? ?Freeman Caldron, PA-C ?[facility] ?[place], [place] ?[phone]   ?08/25/2021, 11:10 AM  ?

## 2021-08-26 ENCOUNTER — Telehealth: Payer: Self-pay | Admitting: *Deleted

## 2021-08-26 LAB — COMPREHENSIVE METABOLIC PANEL
ALT: 26 IU/L (ref 0–32)
AST: 22 IU/L (ref 0–40)
Albumin/Globulin Ratio: 1.4 (ref 1.2–2.2)
Albumin: 4.3 g/dL (ref 3.8–4.8)
Alkaline Phosphatase: 141 IU/L — ABNORMAL HIGH (ref 44–121)
BUN/Creatinine Ratio: 9 (ref 9–23)
BUN: 6 mg/dL (ref 6–24)
Bilirubin Total: 0.6 mg/dL (ref 0.0–1.2)
CO2: 25 mmol/L (ref 20–29)
Calcium: 9.7 mg/dL (ref 8.7–10.2)
Chloride: 102 mmol/L (ref 96–106)
Creatinine, Ser: 0.67 mg/dL (ref 0.57–1.00)
Globulin, Total: 3.1 g/dL (ref 1.5–4.5)
Glucose: 119 mg/dL — ABNORMAL HIGH (ref 70–99)
Potassium: 4.4 mmol/L (ref 3.5–5.2)
Sodium: 139 mmol/L (ref 134–144)
Total Protein: 7.4 g/dL (ref 6.0–8.5)
eGFR: 112 mL/min/{1.73_m2} (ref >59)

## 2021-08-26 LAB — CBC WITH DIFFERENTIAL/PLATELET
Basophils Absolute: 0.1 10*3/uL (ref 0.0–0.2)
Basos: 1 %
EOS (ABSOLUTE): 0.2 10*3/uL (ref 0.0–0.4)
Eos: 2 %
Hematocrit: 38.6 % (ref 34.0–46.6)
Hemoglobin: 12 g/dL (ref 11.1–15.9)
Immature Grans (Abs): 0 10*3/uL (ref 0.0–0.1)
Immature Granulocytes: 0 %
Lymphocytes Absolute: 3.5 10*3/uL — ABNORMAL HIGH (ref 0.7–3.1)
Lymphs: 41 %
MCH: 23.7 pg — ABNORMAL LOW (ref 26.6–33.0)
MCHC: 31.1 g/dL — ABNORMAL LOW (ref 31.5–35.7)
MCV: 76 fL — ABNORMAL LOW (ref 79–97)
Monocytes Absolute: 0.7 10*3/uL (ref 0.1–0.9)
Monocytes: 9 %
Neutrophils Absolute: 4 10*3/uL (ref 1.4–7.0)
Neutrophils: 47 %
Platelets: 314 10*3/uL (ref 150–450)
RBC: 5.07 x10E6/uL (ref 3.77–5.28)
RDW: 15.5 % — ABNORMAL HIGH (ref 11.7–15.4)
WBC: 8.4 10*3/uL (ref 3.4–10.8)

## 2021-08-26 LAB — VITAMIN D 25 HYDROXY (VIT D DEFICIENCY, FRACTURES): Vit D, 25-Hydroxy: 26.8 ng/mL — ABNORMAL LOW (ref 30.0–100.0)

## 2021-08-26 NOTE — Telephone Encounter (Signed)
Pt called for lab results, she missed a call from office. Results and provider's comments given, pt verbalized understanding. ?

## 2021-09-05 ENCOUNTER — Encounter: Payer: Self-pay | Admitting: *Deleted

## 2021-09-06 ENCOUNTER — Other Ambulatory Visit: Payer: Self-pay | Admitting: Critical Care Medicine

## 2021-09-06 DIAGNOSIS — E1165 Type 2 diabetes mellitus with hyperglycemia: Secondary | ICD-10-CM

## 2021-09-07 NOTE — Telephone Encounter (Signed)
Refilled 08/25/2021 #60 3 refills. ?Requested Prescriptions  ?Pending Prescriptions Disp Refills  ?? metFORMIN (GLUCOPHAGE) 500 MG tablet [Pharmacy Med Name: metFORMIN HCl 500 MG Oral Tablet] 90 tablet 0  ?  Sig: TAKE 2 TABLETS WITH MORNING MEAL, AND THEN TAKE 1 TABLET BY MOUTH WITH EVENING MEAL  ?  ? Endocrinology:  Diabetes - Biguanides Failed - 09/06/2021  5:30 AM  ?  ?  Failed - B12 Level in normal range and within 720 days  ?  No results found for: VITAMINB12   ?  ?  Failed - CBC within normal limits and completed in the last 12 months  ?  WBC  ?Date Value Ref Range Status  ?08/25/2021 8.4 3.4 - 10.8 x10E3/uL Final  ?07/22/2017 9.7 4.0 - 10.5 K/uL Final  ? ?RBC  ?Date Value Ref Range Status  ?08/25/2021 5.07 3.77 - 5.28 x10E6/uL Final  ?07/22/2017 4.79 3.87 - 5.11 MIL/uL Final  ? ?Hemoglobin  ?Date Value Ref Range Status  ?08/25/2021 12.0 11.1 - 15.9 g/dL Final  ? ?Hematocrit  ?Date Value Ref Range Status  ?08/25/2021 38.6 34.0 - 46.6 % Final  ? ?MCHC  ?Date Value Ref Range Status  ?08/25/2021 31.1 (L) 31.5 - 35.7 g/dL Final  ?07/22/2017 31.5 30.0 - 36.0 g/dL Final  ? ?MCH  ?Date Value Ref Range Status  ?08/25/2021 23.7 (L) 26.6 - 33.0 pg Final  ?07/22/2017 25.9 (L) 26.0 - 34.0 pg Final  ? ?MCV  ?Date Value Ref Range Status  ?08/25/2021 76 (L) 79 - 97 fL Final  ? ?No results found for: PLTCOUNTKUC, LABPLAT, [place] ?RDW  ?Date Value Ref Range Status  ?08/25/2021 15.5 (H) 11.7 - 15.4 % Final  ? ?  ?  ?  Passed - Cr in normal range and within 360 days  ?  Creatinine, Ser  ?Date Value Ref Range Status  ?08/25/2021 0.67 0.57 - 1.00 mg/dL Final  ?   ?  ?  Passed - HBA1C is between 0 and 7.9 and within 180 days  ?  Hemoglobin A1C  ?Date Value Ref Range Status  ?08/25/2021 6.2 (A) 4.0 - 5.6 % Final  ? ?HbA1c, POC (controlled diabetic range)  ?Date Value Ref Range Status  ?10/14/2020 6.3 0.0 - 7.0 % Final  ? ?Hgb A1c MFr Bld  ?Date Value Ref Range Status  ?03/09/2021 6.2 (H) 4.8 - 5.6 % Final  ?  Comment:  ?            Prediabetes: 5.7 - 6.4 ?         Diabetes: >6.4 ?         Glycemic control for adults with diabetes: <7.0 ?  ?   ?  ?  Passed - eGFR in normal range and within 360 days  ?  GFR calc Af Amer  ?Date Value Ref Range Status  ?05/05/2020 107 >59 mL/min/1.73 Final  ?  Comment:  ?  **In accordance with recommendations from the NKF-ASN Task force,** ?  [facility] is in the process of updating its eGFR calculation to the ?  2021 CKD-EPI creatinine equation that estimates kidney function ?  without a race variable. ?  ? ?GFR calc non Af Amer  ?Date Value Ref Range Status  ?05/05/2020 93 >59 mL/min/1.73 Final  ? ?eGFR  ?Date Value Ref Range Status  ?08/25/2021 112 >59 mL/min/1.73 Final  ?   ?  ?  Passed - Valid encounter within last 6 months  ?  Recent Outpatient Visits   ?      ?  1 week ago Type 2 diabetes mellitus with hyperglycemia, without long-term current use of insulin ([facility])  ? [place], [place], [place]  ? 6 months ago Type 2 diabetes mellitus with hyperglycemia, without long-term current use of insulin ([place])  ? [place] Elsie Stain, MD  ? 10 months ago Type 2 diabetes mellitus with hyperglycemia, without long-term current use of insulin ([place])  ? [place], [place], [place]  ? 1 year ago Type 2 diabetes mellitus with hyperglycemia, without long-term current use of insulin ([place])  ? [place] Elsie Stain, MD  ? 1 year ago Type 2 diabetes mellitus with hyperglycemia, without long-term current use of insulin ([place])  ? [place], RPH-CPP  ?  ?  ?Future Appointments   ?        ? In 2 months Elsie Stain, MD [place]  ?  ? ?  ?  ?  ? ?

## 2021-10-10 ENCOUNTER — Ambulatory Visit
Admission: EM | Admit: 2021-10-10 | Discharge: 2021-10-10 | Disposition: A | Discharge status: Home / Self Care | Payer: 59 | Attending: Urgent Care | Admitting: Urgent Care

## 2021-10-10 DIAGNOSIS — R300 Dysuria: Secondary | ICD-10-CM | POA: Diagnosis present

## 2021-10-10 DIAGNOSIS — R350 Frequency of micturition: Secondary | ICD-10-CM

## 2021-10-10 DIAGNOSIS — N3001 Acute cystitis with hematuria: Secondary | ICD-10-CM

## 2021-10-10 DIAGNOSIS — E119 Type 2 diabetes mellitus without complications: Secondary | ICD-10-CM

## 2021-10-10 LAB — POCT URINALYSIS DIP (MANUAL ENTRY)
Bilirubin, UA: NEGATIVE
Glucose, UA: NEGATIVE mg/dL
Ketones, POC UA: NEGATIVE mg/dL
Nitrite, UA: NEGATIVE
Protein Ur, POC: NEGATIVE mg/dL
Spec Grav, UA: 1.01 (ref 1.010–1.025)
Urobilinogen, UA: 0.2 E.U./dL
pH, UA: 7 (ref 5.0–8.0)

## 2021-10-10 MED ORDER — NITROFURANTOIN MONOHYD MACRO 100 MG PO CAPS: 100.0000 mg | ORAL_CAPSULE | Freq: Two times a day (BID) | ORAL | 0 refills | Status: DC

## 2021-10-10 NOTE — Discharge Instructions (Signed)

## 2021-10-10 NOTE — ED Provider Notes (Signed)
?[place] ? ? ?MRN: [id number] DOB: May 15, 1981 ? ?Subjective:  ? ?Victoria Schneider is a 41 y.o. female presenting for 4 day history of acute onset dysuria, urinary frequency, low back pain, urinary urgency. Tries to hydrate well with water but drinks a lot of coffee. Has been advised by her PCP to schedule an appointment for a consult with an urologist but hasn't done this.  ? ?No current facility-administered medications for this encounter. ? ?Current Outpatient Medications:  ?  acetaminophen (TYLENOL) 325 MG tablet, Take 650 mg by mouth every 6 (six) hours as needed., Disp: , Rfl:  ?  Blood Glucose Monitoring Suppl (TRUE METRIX METER) w/Device KIT, 1 each by Does not apply route in the morning and at bedtime., Disp: 1 kit, Rfl: 0 ?  cetirizine-pseudoephedrine (ZYRTEC-D) 5-120 MG tablet, Take 1 tablet by mouth 2 (two) times daily. prn, Disp: 60 tablet, Rfl: 1 ?  ferrous sulfate 325 (65 FE) MG tablet, Take 1 tablet (325 mg total) by mouth daily., Disp: 100 tablet, Rfl: 1 ?  fluticasone (FLONASE) 50 MCG/ACT nasal spray, PLACE 2 SPRAYS INTO BOTH NOSTRILS DAILY., Disp: 16 g, Rfl: 6 ?  glucose blood (TRUE METRIX BLOOD GLUCOSE TEST) test strip, Use as instructed, Disp: 100 each, Rfl: 12 ?  ibuprofen (ADVIL) 800 MG tablet, Take 1 tablet (800 mg total) by mouth 3 (three) times daily. Prn pain, Disp: 30 tablet, Rfl: 0 ?  metFORMIN (GLUCOPHAGE) 500 MG tablet, 2 tabs daily with dinner, Disp: 60 tablet, Rfl: 3 ?  methocarbamol (ROBAXIN) 500 MG tablet, Take 2 tablets (1,000 mg total) by mouth every 8 (eight) hours as needed for muscle spasms., Disp: 90 tablet, Rfl: 0 ?  NEOMYCIN-POLYMYXIN-HYDROCORTISONE (CORTISPORIN) 1 % SOLN OTIC solution, Apply 1-2 drops to toe BID after soaking, Disp: 10 mL, Rfl: 1 ?  TRUEplus Lancets 28G MISC, USE AS DIRECTED IN THE MORNING AND AT BEDTIME, Disp: 100 each, Rfl: 2 ?  Vitamin D, Ergocalciferol, (DRISDOL) 1.25 MG (50000 UNIT) CAPS capsule, Take 1 capsule (50,000 Units  total) by mouth every 7 (seven) days., Disp: 16 capsule, Rfl: 3  ? ?Allergies  ?Allergen Reactions  ? Penicillins Rash and Other (See Comments)  ?  Has patient had a PCN reaction causing immediate rash, facial/tongue/throat swelling, SOB or lightheadedness with hypotension: No ?Has patient had a PCN reaction causing severe rash involving mucus membranes or skin necrosis: No ?Has patient had a PCN reaction that required hospitalization No ?Has patient had a PCN reaction occurring within the last 10 years: Yes ?If all of the above answers are "NO", then may proceed with Cephalosporin use. ?  ? ? ?Past Medical History:  ?Diagnosis Date  ? Acute cystitis with hematuria 03/10/2021  ? Elevated LFTs 06/09/2020  ? GERD (gastroesophageal reflux disease)   ? Type 2 diabetes mellitus with hyperglycemia, without long-term current use of insulin ([place]) 05/05/2020  ?  ? ?Past Surgical History:  ?Procedure Laterality Date  ? NO PAST SURGERIES    ? ? ?Family History  ?Problem Relation Age of Onset  ? Diabetes Father   ? Hypertension Father   ? Diabetes Brother   ? Hypertension Mother   ? Depression Daughter   ? ? ?Social History  ? ?Tobacco Use  ? Smoking status: Never  ? Smokeless tobacco: Never  ?Vaping Use  ? Vaping Use: Never used  ?Substance Use Topics  ? Alcohol use: Not Currently  ?  Comment: rarely  ? Drug use: No  ? ? ?  ROS ? ? ?Objective:  ? ?Vitals: ?BP 113/80 (BP Location: Right Arm)   Pulse 74   Temp 97.8 ?F (36.6 ?C) (Oral)   Resp 18   LMP  (Approximate)   SpO2 98%  ? ?Physical Exam ?Constitutional:   ?   General: She is not in acute distress. ?   Appearance: Normal appearance. She is well-developed. She is not ill-appearing, toxic-appearing or diaphoretic.  ?HENT:  ?   Head: Normocephalic and atraumatic.  ?   Nose: Nose normal.  ?   Mouth/Throat:  ?   Mouth: Mucous membranes are moist.  ?Eyes:  ?   General: No scleral icterus.    ?   Right eye: No discharge.     ?   Left eye: No discharge.  ?   Extraocular  Movements: Extraocular movements intact.  ?   Conjunctiva/sclera: Conjunctivae normal.  ?Cardiovascular:  ?   Rate and Rhythm: Normal rate.  ?Pulmonary:  ?   Effort: Pulmonary effort is normal.  ?Abdominal:  ?   General: Bowel sounds are normal. There is no distension.  ?   Palpations: Abdomen is soft. There is no mass.  ?   Tenderness: There is no abdominal tenderness. There is no right CVA tenderness, left CVA tenderness, guarding or rebound.  ?Skin: ?   General: Skin is warm and dry.  ?Neurological:  ?   General: No focal deficit present.  ?   Mental Status: She is alert and oriented to person, place, and time.  ?Psychiatric:     ?   Mood and Affect: Mood normal.     ?   Behavior: Behavior normal.     ?   Thought Content: Thought content normal.     ?   Judgment: Judgment normal.  ? ? ?Results for orders placed or performed during the hospital encounter of 10/10/21 (from the past 24 hour(s))  ?POCT urinalysis dipstick     Status: Abnormal  ? Collection Time: 10/10/21  8:55 AM  ?Result Value Ref Range  ? Color, UA yellow yellow  ? Clarity, UA cloudy (A) clear  ? Glucose, UA negative negative mg/dL  ? Bilirubin, UA negative negative  ? Ketones, POC UA negative negative mg/dL  ? Spec Grav, UA 1.010 1.010 - 1.025  ? Blood, UA large (A) negative  ? pH, UA 7.0 5.0 - 8.0  ? Protein Ur, POC negative negative mg/dL  ? Urobilinogen, UA 0.2 0.2 or 1.0 E.U./dL  ? Nitrite, UA Negative Negative  ? Leukocytes, UA Small (1+) (A) Negative  ? ? ?Assessment and Plan :  ? ?PDMP not reviewed this encounter. ? ?1. Acute cystitis with hematuria   ?2. Dysuria   ?3. Urinary frequency   ?4. Type 2 diabetes mellitus treated without insulin ([place])   ? ? ?Start Macrobid to cover for acute cystitis, urine culture pending.  Recommended aggressive hydration, limiting urinary irritants. Counseled patient on potential for adverse effects with medications prescribed/recommended today, ER and return-to-clinic precautions discussed, patient  verbalized understanding. ? ?  ?Jaynee Eagles, PA-C ?10/10/21 [id number] ? ?

## 2021-10-10 NOTE — ED Triage Notes (Signed)
4 day h/o dysuria, low back pain, urinary frequency and urgency. Has taken azo with minimal relief. ?

## 2021-10-12 LAB — URINE CULTURE: Culture: 40000 — AB

## 2021-10-31 ENCOUNTER — Ambulatory Visit: Payer: Self-pay

## 2021-10-31 NOTE — Telephone Encounter (Signed)
2nd attempt to reach pt, left VM to call back. 

## 2021-10-31 NOTE — Telephone Encounter (Signed)
Pt has a stuffy nose, running eyes and ear pain off and on / pt called to get an appt for today / please advise  ? ?Left message to call back about symptoms. ?

## 2021-10-31 NOTE — Telephone Encounter (Signed)
Patient called, left VM to return the call to the office to discuss symptoms with a nurse. Will route to practice since 3rd attempt.  ? ?Summary: stuufy nose, runny eyes and ear pain  ? Pt has a stuffy nose, running eyes and ear pain off and on / pt called to get an appt for today / please advise   ?  ? ?

## 2021-11-02 NOTE — Telephone Encounter (Signed)
Called pt and appt was made for tom. ?

## 2021-11-03 ENCOUNTER — Ambulatory Visit: Payer: 59 | Attending: Physician Assistant | Admitting: Physician Assistant

## 2021-11-03 ENCOUNTER — Encounter: Payer: Self-pay | Admitting: Physician Assistant

## 2021-11-03 ENCOUNTER — Other Ambulatory Visit: Payer: Self-pay

## 2021-11-03 VITALS — BP 108/73 | HR 84 | Temp 98.4°F | Ht 63.0 in | Wt 213.0 lb

## 2021-11-03 DIAGNOSIS — J0100 Acute maxillary sinusitis, unspecified: Secondary | ICD-10-CM | POA: Diagnosis not present

## 2021-11-03 MED ORDER — FLUCONAZOLE 150 MG PO TABS
150.0000 mg | ORAL_TABLET | Freq: Once | ORAL | 0 refills | Status: AC
  Filled 2021-11-03: qty 1, 1d supply, fill #0

## 2021-11-03 MED ORDER — AZITHROMYCIN 250 MG PO TABS
ORAL_TABLET | ORAL | 0 refills | Status: AC
  Filled 2021-11-03: qty 6, 5d supply, fill #0

## 2021-11-03 NOTE — Progress Notes (Signed)
Patient ID: Victoria Schneider, female   DOB: [birth year], 41 y.o.   MRN: [id number]   [facility] [name], is a 41 y.o. female  GHW:[id number]  [id number]  DOB - Feb 06, 1981  Chief Complaint  Patient presents with   Allergies    For 3 weeks. OTC medications are not providing relief       Subjective:   Victoria Schneider is a 41 y.o. female here today for sinus congestion and pressure that has worsened over the last 3 weeks.  No fever.    Onset:  about 3 weeks ago Location:  sinus pressure and ear congestion R>L Duration:  3 weeks Characterization/quality:  pressure, HA, scratchy throat, yellow mucus Alleviating/aggravating:  OTC help some Radiation:  mild cough Severity:  moderate  No problems updated.  ALLERGIES: Allergies  Allergen Reactions   Penicillins Rash and Other (See Comments)    Has patient had a PCN reaction causing immediate rash, facial/tongue/throat swelling, SOB or lightheadedness with hypotension: No Has patient had a PCN reaction causing severe rash involving mucus membranes or skin necrosis: No Has patient had a PCN reaction that required hospitalization No Has patient had a PCN reaction occurring within the last 10 years: Yes If all of the above answers are "NO", then may proceed with Cephalosporin use.     PAST MEDICAL HISTORY: Past Medical History:  Diagnosis Date   Acute cystitis with hematuria 03/10/2021   Elevated LFTs 06/09/2020   GERD (gastroesophageal reflux disease)    Type 2 diabetes mellitus with hyperglycemia, without long-term current use of insulin ([place]) 05/05/2020    MEDICATIONS AT HOME: Prior to Admission medications   Medication Sig Start Date End Date Taking? Authorizing Provider  acetaminophen (TYLENOL) 325 MG tablet Take 650 mg by mouth every 6 (six) hours as needed.   Yes [provider]  azithromycin (ZITHROMAX) 250 MG tablet Take 2 tablets on day 1, then 1 tablet daily on days 2 through 5 11/03/21 11/08/21 Yes  Tangee Marszalek M, PA-C  Blood Glucose Monitoring Suppl (TRUE METRIX METER) w/Device KIT 1 each by Does not apply route in the morning and at bedtime. 05/05/20  Yes Garyn Arlotta M, PA-C  cetirizine-pseudoephedrine (ZYRTEC-D) 5-120 MG tablet Take 1 tablet by mouth 2 (two) times daily. prn 08/25/21  Yes Jossilyn Benda, Dionne Bucy, PA-C  ferrous sulfate 325 (65 FE) MG tablet Take 1 tablet (325 mg total) by mouth daily. 08/25/21  Yes Freeman Caldron M, PA-C  fluconazole (DIFLUCAN) 150 MG tablet Take 1 tablet (150 mg total) by mouth once for 1 dose. If needed for yeast infection after antibiotics 11/03/21 11/04/21 Yes Marguetta Windish, Dionne Bucy, PA-C  fluticasone [facility]) 50 MCG/ACT nasal spray PLACE 2 SPRAYS INTO BOTH NOSTRILS DAILY. 08/25/21 08/25/22 Yes Darla Mcdonald, Dionne Bucy, PA-C  glucose blood (TRUE METRIX BLOOD GLUCOSE TEST) test strip Use as instructed 03/09/21  Yes Elsie Stain, MD  ibuprofen (ADVIL) 800 MG tablet Take 1 tablet (800 mg total) by mouth 3 (three) times daily. Prn pain 08/25/21  Yes Argentina Donovan, PA-C  metFORMIN (GLUCOPHAGE) 500 MG tablet 2 tabs daily with dinner 08/25/21  Yes Meiko Ives M, PA-C  methocarbamol (ROBAXIN) 500 MG tablet Take 2 tablets (1,000 mg total) by mouth every 8 (eight) hours as needed for muscle spasms. 08/25/21  Yes Ward Boissonneault M, PA-C  NEOMYCIN-POLYMYXIN-HYDROCORTISONE (CORTISPORIN) 1 % SOLN OTIC solution Apply 1-2 drops to toe BID after soaking 01/20/21  Yes Hyatt, Max T, DPM  TRUEplus Lancets 28G MISC USE AS  DIRECTED IN THE MORNING AND AT BEDTIME 03/09/21 03/09/22 Yes Elsie Stain, MD  Vitamin D, Ergocalciferol, (DRISDOL) 1.25 MG (50000 UNIT) CAPS capsule Take 1 capsule (50,000 Units total) by mouth every 7 (seven) days. 03/09/21  Yes Elsie Stain, MD  albuterol (VENTOLIN HFA) 108 (90 Base) MCG/ACT inhaler Inhale 1-2 puffs into the lungs every 6 (six) hours as needed for wheezing or shortness of breath. Patient not taking: Reported on 08/06/2019 07/15/19 02/09/20   Hall-Potvin, [place], PA-C  famotidine (PEPCID) 20 MG tablet One after supper Patient not taking: Reported on 08/06/2019 01/23/19 02/09/20  Tanda Rockers, MD    ROS: Neg cardiac Neg GI Neg GU Neg MS Neg psych Neg neuro  Objective:   Vitals:   11/03/21 1620  BP: 108/73  Pulse: 84  Temp: 98.4 F (36.9 C)  TempSrc: Oral  SpO2: 97%  Weight: 213 lb (96.6 kg)  Height: 5' 3"  (1.6 m)   Exam General appearance : Awake, alert, not in any distress. Speech Clear. Not toxic looking HEENT: Atraumatic and Normocephalic,,  B TM with congestion R>L.  Mild swelling and congestion in the maxillary area.  Neck: Supple, no JVD. No cervical lymphadenopathy.  Chest: Good air entry bilaterally, CTAB.  No rales/rhonchi/wheezing CVS: S1 S2 regular, no murmurs.  Extremities: B/L Lower Ext shows no edema, both legs are warm to touch Neurology: Awake alert, and oriented X 3, CN II-XII intact, Non focal Skin: No Rash  Data Review Lab Results  Component Value Date   HGBA1C 6.2 (A) 08/25/2021   HGBA1C 6.2 (H) 03/09/2021   HGBA1C 6.3 10/14/2020    Assessment & Plan   1. Acute maxillary sinusitis, recurrence not specified - azithromycin (ZITHROMAX) 250 MG tablet; Take 2 tablets on day 1, then 1 tablet daily on days 2 through 5  Dispense: 6 tablet; Refill: 0 - fluconazole (DIFLUCAN) 150 MG tablet; Take 1 tablet (150 mg total) by mouth once for 1 dose. If needed for yeast infection after antibiotics  Dispense: 1 tablet; Refill: 0    Return for keep june appt with Dr Joya Gaskins.  The patient was given clear instructions to go to ER or return to medical center if symptoms don't improve, worsen or new problems develop. The patient verbalized understanding. The patient was told to call to get lab results if they haven't heard anything in the next week.      Freeman Caldron, PA-C [facility], [place] and [facility] [place], [place]   11/03/2021, 4:42 PM

## 2021-12-01 ENCOUNTER — Encounter: Payer: Self-pay | Admitting: Critical Care Medicine

## 2021-12-01 ENCOUNTER — Ambulatory Visit: Payer: 59 | Attending: Critical Care Medicine | Admitting: Critical Care Medicine

## 2021-12-01 VITALS — BP 114/78 | HR 77 | Temp 98.3°F | Ht 63.0 in | Wt 215.2 lb

## 2021-12-01 DIAGNOSIS — D500 Iron deficiency anemia secondary to blood loss (chronic): Secondary | ICD-10-CM

## 2021-12-01 DIAGNOSIS — R319 Hematuria, unspecified: Secondary | ICD-10-CM | POA: Insufficient documentation

## 2021-12-01 DIAGNOSIS — T7840XD Allergy, unspecified, subsequent encounter: Secondary | ICD-10-CM

## 2021-12-01 DIAGNOSIS — Z23 Encounter for immunization: Secondary | ICD-10-CM

## 2021-12-01 DIAGNOSIS — E1165 Type 2 diabetes mellitus with hyperglycemia: Secondary | ICD-10-CM

## 2021-12-01 DIAGNOSIS — H6982 Other specified disorders of Eustachian tube, left ear: Secondary | ICD-10-CM

## 2021-12-01 DIAGNOSIS — Z124 Encounter for screening for malignant neoplasm of cervix: Secondary | ICD-10-CM

## 2021-12-01 DIAGNOSIS — G8929 Other chronic pain: Secondary | ICD-10-CM

## 2021-12-01 DIAGNOSIS — M545 Low back pain, unspecified: Secondary | ICD-10-CM | POA: Diagnosis not present

## 2021-12-01 DIAGNOSIS — N920 Excessive and frequent menstruation with regular cycle: Secondary | ICD-10-CM

## 2021-12-01 DIAGNOSIS — R311 Benign essential microscopic hematuria: Secondary | ICD-10-CM

## 2021-12-01 DIAGNOSIS — J309 Allergic rhinitis, unspecified: Secondary | ICD-10-CM

## 2021-12-01 DIAGNOSIS — E559 Vitamin D deficiency, unspecified: Secondary | ICD-10-CM

## 2021-12-01 MED ORDER — TRUEPLUS LANCETS 28G MISC: 2 refills | Status: AC

## 2021-12-01 MED ORDER — FERROUS SULFATE 325 (65 FE) MG PO TABS: 325.0000 mg | ORAL_TABLET | Freq: Every day | ORAL | 1 refills | Status: DC

## 2021-12-01 MED ORDER — FLUTICASONE PROPIONATE 50 MCG/ACT NA SUSP: 2.0000 | Freq: Every day | NASAL | 6 refills | Status: DC

## 2021-12-01 MED ORDER — VITAMIN D (ERGOCALCIFEROL) 1.25 MG (50000 UNIT) PO CAPS
50000.0000 [IU] | ORAL_CAPSULE | ORAL | 3 refills | Status: DC
Start: 2021-12-01 — End: 2022-03-16

## 2021-12-01 MED ORDER — TRUE METRIX BLOOD GLUCOSE TEST VI STRP: ORAL_STRIP | 12 refills | Status: DC

## 2021-12-01 MED ORDER — METFORMIN HCL 500 MG PO TABS: ORAL_TABLET | ORAL | 3 refills | Status: DC

## 2021-12-01 NOTE — Progress Notes (Signed)
Established Patient Office Visit  Subjective:  Patient ID: Victoria Schneider, female    DOB: [birth year]  Age: 41 y.o. MRN: [id number]  CC:  Chief Complaint  Patient presents with   Neck Pain    HPI 02/2021 [facility] presents for primary care follow-up visit last seen December 2021.  Patient has been noting periods of blood in the urine without dysuria.  She was seen previously this year in the emergency room for acute cystitis with hematuria.  Treated with a course of antibiotics for E. coli.  Patient does note excess menses with each menstrual period.  Patient does maintain metformin 1000 mg a.m. 500 mg p.m. for type 2 diabetes.  A1c had been controlled previously.  Blood sugar on arrival today 115 patient has also mild iron deficiency anemia from previous lab testing for which she is on iron supplementation. Patient does have mild vitamin D deficiency and is on vitamin D supplement.  Prior history of hepatic steatosis with elevated liver functions for which use of statin was held.  Patient still has significant obesity BMI of nearly 38.  Patient does state her bilateral wrist pain has been improved suspected to be due to carpal tunnel syndrome and doing well with wrist splints at night.  Patient also has hypersomnia with excess snoring.  She was not able to follow through with a referral to sleep medicine.  12/01/21 patient did not need interpreter This is a return visit from prior visit in September 2022.  In the interim the patient was seen in May for a bout of sinusitis from which she has resolved.  She does still have some ear popping and was given Zyrtec-D and Flonase by the PA.  Patient also had a visit in March was found to have hematuria and iron deficiency anemia referred to urology she has never been able to complete the appointment as she did not receive a phone call but they state they tried to call with no answer.  She is also had low back pain and neck pain that is  gotten worse and her weight is up to 215 pounds.  Note in March with her diabetes she was screened again has an A1c of 6.2 and is on the metformin at 1000 mg daily.  She had low vitamin D levels and was recommended to take vitamin D but she is not actually on any supplements at this time.  Today she needs a urine microalbumin assessed she needs a Pap smear foot exam and needs to get her eye exam performed  Patient still notes some blood in the urine and has occasional vaginal discharge.  There are no other complaints.  She is due a pneumonia vaccine she agrees to receive at this visit.   Below is assessment from the PA visit in March [place] 08/2021   1. Type 2 diabetes mellitus with hyperglycemia, without long-term current use of insulin (HCC) Controlled on the dose she is actually taking which I will send prescription she is currently taking it now - Glucose (CBG) - HgB A1c - POCT URINALYSIS DIP (CLINITEK) - Comprehensive metabolic panel - metFORMIN (GLUCOPHAGE) 500 MG tablet; 2 tabs daily with dinner  Dispense: 60 tablet; Refill: 3   2. Other microscopic hematuria No infection-increase water intake to 80-100 ounces water daily - POCT URINALYSIS DIP (CLINITEK) - Ambulatory referral to Urology   3. Dysfunction of both eustachian tubes Flonase sent - fluticasone (FLONASE) 50 MCG/ACT nasal spray; PLACE 2 SPRAYS  INTO BOTH NOSTRILS DAILY.  Dispense: 16 g; Refill: 6   4. Iron deficiency anemia due to chronic blood loss - ferrous sulfate 325 (65 FE) MG tablet; Take 1 tablet (325 mg total) by mouth daily.  Dispense: 100 tablet; Refill: 1 - CBC with Differential/Platelet   6. Allergy, subsequent encounter - cetirizine-pseudoephedrine (ZYRTEC-D) 5-120 MG tablet; Take 1 tablet by mouth 2 (two) times daily. prn  Dispense: 60 tablet; Refill: 1 - fluticasone (FLONASE) 50 MCG/ACT nasal spray; PLACE 2 SPRAYS INTO BOTH NOSTRILS DAILY.  Dispense: 16 g; Refill: 6   7. Vitamin D deficiency - Vitamin  D, 25-hydroxy   8. Chronic right-sided low back pain without sciatica - ibuprofen (ADVIL) 800 MG tablet; Take 1 tablet (800 mg total) by mouth 3 (three) times daily. Prn pain  Dispense: 30 tablet; Refill: 0 - methocarbamol (ROBAXIN) 500 MG tablet; Take 2 tablets (1,000 mg total) by mouth every 8 (eight) hours as needed for muscle spasms.  Dispense: 90 tablet; Refill: 0       The patient never received her Pap smear never got the visit with gynecology.  Past Medical History:  Diagnosis Date   Acute cystitis with hematuria 03/10/2021   Elevated LFTs 06/09/2020   GERD (gastroesophageal reflux disease)    Type 2 diabetes mellitus with hyperglycemia, without long-term current use of insulin ([place]) 05/05/2020    Past Surgical History:  Procedure Laterality Date   NO PAST SURGERIES      Family History  Problem Relation Age of Onset   Diabetes Father    Hypertension Father    Diabetes Brother    Hypertension Mother    Depression Daughter     Social History   Socioeconomic History   Marital status: Single    Spouse name: Not on file   Number of children: Not on file   Years of education: Not on file   Highest education level: Not on file  Occupational History   Not on file  Tobacco Use   Smoking status: Never   Smokeless tobacco: Never  Vaping Use   Vaping Use: Never used  Substance and Sexual Activity   Alcohol use: Not Currently    Comment: rarely   Drug use: No   Sexual activity: Yes    Birth control/protection: None  Other Topics Concern   Not on file  Social History Narrative   Not on file   Social Determinants of Health   Financial Resource Strain: Not on file  Food Insecurity: Not on file  Transportation Needs: Not on file  Physical Activity: Not on file  Stress: Not on file  Social Connections: Not on file  Intimate Partner Violence: Not on file    Outpatient Medications Prior to Visit  Medication Sig Dispense Refill   acetaminophen (TYLENOL) 325 MG  tablet Take 650 mg by mouth every 6 (six) hours as needed.     Blood Glucose Monitoring Suppl (TRUE METRIX METER) w/Device KIT 1 each by Does not apply route in the morning and at bedtime. 1 kit 0   cetirizine (ZYRTEC) 10 MG tablet Take 10 mg by mouth daily as needed for allergies.     ibuprofen (ADVIL) 800 MG tablet Take 1 tablet (800 mg total) by mouth 3 (three) times daily. Prn pain 30 tablet 0   cetirizine-pseudoephedrine (ZYRTEC-D) 5-120 MG tablet Take 1 tablet by mouth 2 (two) times daily. prn 60 tablet 1   ferrous sulfate 325 (65 FE) MG tablet Take 1 tablet (325 mg  total) by mouth daily. 100 tablet 1   fluticasone (FLONASE) 50 MCG/ACT nasal spray PLACE 2 SPRAYS INTO BOTH NOSTRILS DAILY. 16 g 6   glucose blood (TRUE METRIX BLOOD GLUCOSE TEST) test strip Use as instructed 100 each 12   metFORMIN (GLUCOPHAGE) 500 MG tablet 2 tabs daily with dinner 60 tablet 3   TRUEplus Lancets 28G MISC USE AS DIRECTED IN THE MORNING AND AT BEDTIME 100 each 2   Vitamin D, Ergocalciferol, (DRISDOL) 1.25 MG (50000 UNIT) CAPS capsule Take 1 capsule (50,000 Units total) by mouth every 7 (seven) days. 16 capsule 3   methocarbamol (ROBAXIN) 500 MG tablet Take 2 tablets (1,000 mg total) by mouth every 8 (eight) hours as needed for muscle spasms. (Patient not taking: Reported on 12/01/2021) 90 tablet 0   NEOMYCIN-POLYMYXIN-HYDROCORTISONE (CORTISPORIN) 1 % SOLN OTIC solution Apply 1-2 drops to toe BID after soaking (Patient not taking: Reported on 12/01/2021) 10 mL 1   No facility-administered medications prior to visit.    Allergies  Allergen Reactions   Penicillins Rash and Other (See Comments)    Has patient had a PCN reaction causing immediate rash, facial/tongue/throat swelling, SOB or lightheadedness with hypotension: No Has patient had a PCN reaction causing severe rash involving mucus membranes or skin necrosis: No Has patient had a PCN reaction that required hospitalization No Has patient had a PCN reaction  occurring within the last 10 years: Yes If all of the above answers are "NO", then may proceed with Cephalosporin use.     ROS Review of Systems  Constitutional:  Negative for chills, diaphoresis and fever.  HENT:  Negative for congestion, hearing loss, nosebleeds, sore throat and tinnitus.   Eyes:  Negative for photophobia and redness.  Respiratory:  Negative for cough, shortness of breath, wheezing and stridor.   Cardiovascular:  Negative for chest pain, palpitations and leg swelling.  Gastrointestinal:  Negative for abdominal pain, blood in stool, constipation, diarrhea, nausea and vomiting.  Endocrine: Negative for polydipsia.  Genitourinary:  Positive for hematuria and menstrual problem. Negative for dysuria, flank pain, frequency and urgency.  Musculoskeletal:  Positive for back pain and neck pain. Negative for myalgias.  Skin:  Negative for rash.  Allergic/Immunologic: Negative for environmental allergies.  Neurological:  Negative for dizziness, tremors, seizures, weakness and headaches.  Hematological:  Does not bruise/bleed easily.  Psychiatric/Behavioral:  Negative for suicidal ideas. The patient is not nervous/anxious.       Objective:    Physical Exam Vitals reviewed.  Constitutional:      Appearance: Normal appearance. She is well-developed. She is obese. She is not diaphoretic.  HENT:     Head: Normocephalic and atraumatic.     Right Ear: Tympanic membrane and external ear normal. There is no impacted cerumen.     Left Ear: Tympanic membrane and external ear normal. There is no impacted cerumen.     Nose: Nose normal. No nasal deformity, septal deviation, mucosal edema or rhinorrhea.     Right Sinus: No maxillary sinus tenderness or frontal sinus tenderness.     Left Sinus: No maxillary sinus tenderness or frontal sinus tenderness.     Mouth/Throat:     Mouth: Mucous membranes are moist.     Pharynx: Oropharynx is clear. No oropharyngeal exudate.  Eyes:      General: No scleral icterus.    Conjunctiva/sclera: Conjunctivae normal.     Pupils: Pupils are equal, round, and reactive to light.  Neck:     Thyroid: No thyromegaly.  Vascular: No carotid bruit or JVD.     Trachea: Trachea normal. No tracheal tenderness or tracheal deviation.  Cardiovascular:     Rate and Rhythm: Normal rate and regular rhythm.     Chest Wall: PMI is not displaced.     Pulses: Normal pulses. No decreased pulses.     Heart sounds: Normal heart sounds, S1 normal and S2 normal. Heart sounds not distant. No murmur heard.    No systolic murmur is present.     No diastolic murmur is present.     No friction rub. No gallop. No S3 or S4 sounds.  Pulmonary:     Effort: Pulmonary effort is normal. No tachypnea, accessory muscle usage or respiratory distress.     Breath sounds: Normal breath sounds. No stridor. No decreased breath sounds, wheezing, rhonchi or rales.  Chest:     Chest wall: No tenderness.  Abdominal:     General: Bowel sounds are normal. There is no distension.     Palpations: Abdomen is soft. Abdomen is not rigid.     Tenderness: There is no abdominal tenderness. There is no guarding or rebound.  Musculoskeletal:        General: Tenderness present. Normal range of motion.     Cervical back: Normal range of motion and neck supple. No edema, erythema or rigidity. No muscular tenderness. Normal range of motion.     Comments: Tenderness both paraspinal lumbar muscles and also neck muscles with neck muscle stiffness and spasticity going down to upper trapezius  Foot exam was normal  Lymphadenopathy:     Head:     Right side of head: No submental or submandibular adenopathy.     Left side of head: No submental or submandibular adenopathy.     Cervical: No cervical adenopathy.  Skin:    General: Skin is warm and dry.     Coloration: Skin is not pale.     Findings: No rash.     Nails: There is no clubbing.  Neurological:     Mental Status: She is alert  and oriented to person, place, and time.     Sensory: No sensory deficit.  Psychiatric:        Mood and Affect: Mood normal.        Speech: Speech normal.        Behavior: Behavior normal.        Thought Content: Thought content normal.     BP 114/78   Pulse 77   Temp 98.3 F (36.8 C) (Oral)   Ht _0  (1.6 m)   Wt 215 lb 3.2 oz (97.6 kg)   LMP 10/14/2021 (Approximate)   SpO2 97%   BMI 38.12 kg/m  Wt Readings from Last 3 Encounters:  12/01/21 215 lb 3.2 oz (97.6 kg)  11/03/21 213 lb (96.6 kg)  08/25/21 216 lb 2 oz (98 kg)     Health Maintenance Due  Topic Date Due   OPHTHALMOLOGY EXAM  Never done   PAP SMEAR-Modifier  Never done   URINE MICROALBUMIN  05/26/2021    There are no preventive care reminders to display for this patient.  Lab Results  Component Value Date   TSH 2.670 10/14/2020   Lab Results  Component Value Date   WBC 8.4 08/25/2021   HGB 12.0 08/25/2021   HCT 38.6 08/25/2021   MCV 76 (L) 08/25/2021   PLT 314 08/25/2021   Lab Results  Component Value Date   NA 139 08/25/2021  K 4.4 08/25/2021   CO2 25 08/25/2021   GLUCOSE 119 (H) 08/25/2021   BUN 6 08/25/2021   CREATININE 0.67 08/25/2021   BILITOT 0.6 08/25/2021   ALKPHOS 141 (H) 08/25/2021   AST 22 08/25/2021   ALT 26 08/25/2021   PROT 7.4 08/25/2021   ALBUMIN 4.3 08/25/2021   CALCIUM 9.7 08/25/2021   ANIONGAP 9 07/22/2017   EGFR 112 08/25/2021   Lab Results  Component Value Date   CHOL 175 05/26/2020   Lab Results  Component Value Date   HDL 56 05/26/2020   Lab Results  Component Value Date   LDLCALC 102 (H) 05/26/2020   Lab Results  Component Value Date   TRIG 93 05/26/2020   Lab Results  Component Value Date   CHOLHDL 3.1 05/26/2020   Lab Results  Component Value Date   HGBA1C 6.2 (A) 08/25/2021      Assessment & Plan:   Problem List Items Addressed This Visit       Endocrine   Type 2 diabetes mellitus with hyperglycemia, without long-term current use of  insulin ([place]) - Primary    Patient at goal with A1c we will continue metformin for now  The following Lifestyle Medicine recommendations according to [employer] [facility]) were discussed and offered to patient who agrees to start the journey:  A. Whole Foods, Plant-based plate comprising of fruits and vegetables, plant-based proteins, whole-grain carbohydrates was discussed in detail with the patient.   A list for source of those nutrients were also provided to the patient.  Patient will use only water or unsweetened tea for hydration. B.  The need to stay away from risky substances including alcohol, smoking; obtaining 7 to 9 hours of restorative sleep, at least 150 minutes of moderate intensity exercise weekly, the importance of healthy social connections,  and stress reduction techniques were discussed.  Handout given in Spanish      Relevant Medications   glucose blood (TRUE METRIX BLOOD GLUCOSE TEST) test strip   metFORMIN (GLUCOPHAGE) 500 MG tablet   TRUEplus Lancets 28G MISC   Other Relevant Orders   Urine microalbumin-creatinine with uACR   Glucose (CBG)     Other   Cervical cancer screening    Not yet accomplished we will attempt again to get in with a provider for Pap smear      Menorrhagia with regular cycle    Needs Pap smear will obtain with one of the providers in the clinic      Iron deficiency anemia due to chronic blood loss    Continue iron supplement      Relevant Medications   ferrous sulfate 325 (65 FE) MG tablet   Vitamin D deficiency    Refill vitamin D supplement weekly      Hematuria    Referral to urology have been made patient did not receive call she will now call to get the appointment      Other Visit Diagnoses     Eustachian tube dysfunction, left       Relevant Medications   fluticasone (FLONASE) 50 MCG/ACT nasal spray   Allergy, subsequent encounter       Relevant Medications   fluticasone (FLONASE) 50 MCG/ACT  nasal spray   Chronic right-sided low back pain without sciatica             Meds ordered this encounter  Medications   ferrous sulfate 325 (65 FE) MG tablet    Sig: Take 1 tablet (  325 mg total) by mouth daily.    Dispense:  100 tablet    Refill:  1   fluticasone (FLONASE) 50 MCG/ACT nasal spray    Sig: PLACE 2 SPRAYS INTO BOTH NOSTRILS DAILY.    Dispense:  16 g    Refill:  6   glucose blood (TRUE METRIX BLOOD GLUCOSE TEST) test strip    Sig: Use as instructed    Dispense:  100 each    Refill:  12   metFORMIN (GLUCOPHAGE) 500 MG tablet    Sig: 2 tabs daily with dinner    Dispense:  60 tablet    Refill:  3   TRUEplus Lancets 28G MISC    Sig: USE AS DIRECTED IN THE MORNING AND AT BEDTIME    Dispense:  100 each    Refill:  2   Vitamin D, Ergocalciferol, (DRISDOL) 1.25 MG (50000 UNIT) CAPS capsule    Sig: Take 1 capsule (50,000 Units total) by mouth every 7 (seven) days.    Dispense:  16 capsule    Refill:  3  38 minutes spent multiple systems assessed  Prevnar 20 vaccine given  Patient to use over-the-counter Zyrtec for now continue Flonase    Follow-up: Return in about 5 months (around 05/03/2022).    Asencion Noble, MD

## 2021-12-01 NOTE — Assessment & Plan Note (Signed)
Not fully impacted Debrox eardrops given

## 2021-12-01 NOTE — Assessment & Plan Note (Signed)
Referral to urology have been made patient did not receive call she will now call to get the appointment

## 2021-12-01 NOTE — Assessment & Plan Note (Signed)
Not yet accomplished we will attempt again to get in with a provider for Pap smear

## 2021-12-01 NOTE — Assessment & Plan Note (Signed)
Patient at goal with A1c we will continue metformin for now  The following Lifestyle Medicine recommendations according to [employer] Lifestyle Medicine [facility]) were discussed and offered to patient who agrees to start the journey:  A. Whole Foods, Plant-based plate comprising of fruits and vegetables, plant-based proteins, whole-grain carbohydrates was discussed in detail with the patient.   A list for source of those nutrients were also provided to the patient.  Patient will use only water or unsweetened tea for hydration. B.  The need to stay away from risky substances including alcohol, smoking; obtaining 7 to 9 hours of restorative sleep, at least 150 minutes of moderate intensity exercise weekly, the importance of healthy social connections,  and stress reduction techniques were discussed.  Handout given in Spanish

## 2021-12-01 NOTE — Assessment & Plan Note (Signed)
Needs Pap smear will obtain with one of the providers in the clinic

## 2021-12-01 NOTE — Patient Instructions (Signed)
Call [facility] for bladder bleeding , they have your referral:   [phone]  No change in medications  refills sent to pharmacy  A Prevnar 20 vaccine was given  Urine for protein study sent  Get eye exam with [facility]  An appt for PAP smear this year with PA Sharon Seller will be made  Return Dr Delford Field 5 months  Follow back and neck exercises  Follow lifestyle medicine hand out for diet and walk 5 times a week

## 2021-12-01 NOTE — Assessment & Plan Note (Signed)
Refill vitamin D supplement weekly

## 2021-12-01 NOTE — Assessment & Plan Note (Signed)
Continue iron supplement.

## 2021-12-02 ENCOUNTER — Telehealth: Payer: Self-pay

## 2021-12-02 ENCOUNTER — Ambulatory Visit: Payer: 59 | Admitting: Urology

## 2021-12-02 ENCOUNTER — Encounter: Payer: Self-pay | Admitting: Urology

## 2021-12-02 VITALS — BP 120/79 | HR 87 | Ht 63.0 in | Wt 215.0 lb

## 2021-12-02 DIAGNOSIS — R319 Hematuria, unspecified: Secondary | ICD-10-CM | POA: Diagnosis not present

## 2021-12-02 DIAGNOSIS — R350 Frequency of micturition: Secondary | ICD-10-CM | POA: Diagnosis not present

## 2021-12-02 DIAGNOSIS — R3915 Urgency of urination: Secondary | ICD-10-CM | POA: Diagnosis not present

## 2021-12-02 LAB — MICROSCOPIC EXAMINATION: Epithelial Cells (non renal): >10 /hpf — AB (ref 0–10)

## 2021-12-02 LAB — MICROALBUMIN / CREATININE URINE RATIO
Creatinine, Urine: 319.9 mg/dL
Microalb/Creat Ratio: 31 mg/g creat — ABNORMAL HIGH (ref 0–29)
Microalbumin, Urine: 100.6 ug/mL

## 2021-12-02 LAB — URINALYSIS, COMPLETE
Bilirubin, UA: NEGATIVE
Glucose, UA: NEGATIVE
Ketones, UA: NEGATIVE
Leukocytes,UA: NEGATIVE
Nitrite, UA: NEGATIVE
Specific Gravity, UA: 1.025 (ref 1.005–1.030)
Urobilinogen, Ur: 0.2 mg/dL (ref 0.2–1.0)
pH, UA: 6 (ref 5.0–7.5)

## 2021-12-02 NOTE — Telephone Encounter (Signed)
Pt was called and vm was left, Information has been sent to nurse pool.    interpreter [id number]

## 2021-12-02 NOTE — Telephone Encounter (Signed)
-----   Message from Storm Frisk, MD sent at 12/02/2021  1:37 PM EDT ----- Let pt know urine protein normal. No kidney damage

## 2021-12-05 ENCOUNTER — Encounter: Payer: Self-pay | Admitting: Urology

## 2021-12-05 NOTE — Progress Notes (Signed)
12/02/2021 4:46 PM   Victoria Schneider January 08, 1981 [id number]  Referring provider: Elsie Stain, MD [address] [place],  [place] [address]  Chief Complaint  Patient presents with   Hematuria    HPI: Victoria Schneider is a 41 y.o. female referred for evaluation of hematuria.  Has had several UAs that were dip positive for blood but no microscopy has been performed Symptomatic UTI late April 2023 with urine culture growing E. Coli Has occasional episodes of urinary frequency, urgency and low back pain Denies gross hematuria   PMH: Past Medical History:  Diagnosis Date   Acute cystitis with hematuria 03/10/2021   Elevated LFTs 06/09/2020   GERD (gastroesophageal reflux disease)    Type 2 diabetes mellitus with hyperglycemia, without long-term current use of insulin ([place]) 05/05/2020    Surgical History: Past Surgical History:  Procedure Laterality Date   NO PAST SURGERIES      Home Medications:  Allergies as of 12/02/2021       Reactions   Penicillins Rash, Other (See Comments)   Has patient had a PCN reaction causing immediate rash, facial/tongue/throat swelling, SOB or lightheadedness with hypotension: No Has patient had a PCN reaction causing severe rash involving mucus membranes or skin necrosis: No Has patient had a PCN reaction that required hospitalization No Has patient had a PCN reaction occurring within the last 10 years: Yes If all of the above answers are "NO", then may proceed with Cephalosporin use.        Medication List        Accurate as of December 02, 2021 11:59 PM. If you have any questions, ask your nurse or doctor.          acetaminophen 325 MG tablet Commonly known as: TYLENOL Take 650 mg by mouth every 6 (six) hours as needed.   cetirizine 10 MG tablet Commonly known as: ZYRTEC Take 10 mg by mouth daily as needed for allergies.   ferrous sulfate 325 (65 FE) MG tablet Take 1 tablet (325 mg total) by mouth  daily.   fluticasone 50 MCG/ACT nasal spray Commonly known as: FLONASE PLACE 2 SPRAYS INTO BOTH NOSTRILS DAILY.   ibuprofen 800 MG tablet Commonly known as: ADVIL Take 1 tablet (800 mg total) by mouth 3 (three) times daily. Prn pain   metFORMIN 500 MG tablet Commonly known as: GLUCOPHAGE 2 tabs daily with dinner   methocarbamol 500 MG tablet Commonly known as: Robaxin Take 2 tablets (1,000 mg total) by mouth every 8 (eight) hours as needed for muscle spasms.   True Metrix Blood Glucose Test test strip Generic drug: glucose blood Use as instructed   True Metrix Meter w/Device Kit 1 each by Does not apply route in the morning and at bedtime.   TRUEplus Lancets 28G Misc USE AS DIRECTED IN THE MORNING AND AT BEDTIME   Vitamin D (Ergocalciferol) 1.25 MG (50000 UNIT) Caps capsule Commonly known as: DRISDOL Take 1 capsule (50,000 Units total) by mouth every 7 (seven) days.        Allergies:  Allergies  Allergen Reactions   Penicillins Rash and Other (See Comments)    Has patient had a PCN reaction causing immediate rash, facial/tongue/throat swelling, SOB or lightheadedness with hypotension: No Has patient had a PCN reaction causing severe rash involving mucus membranes or skin necrosis: No Has patient had a PCN reaction that required hospitalization No Has patient had a PCN reaction occurring within the last 10 years: Yes If  all of the above answers are "NO", then may proceed with Cephalosporin use.     Family History: Family History  Problem Relation Age of Onset   Diabetes Father    Hypertension Father    Diabetes Brother    Hypertension Mother    Depression Daughter     Social History:  reports that she has never smoked. She has never used smokeless tobacco. She reports that she does not currently use alcohol. She reports that she does not use drugs.   Physical Exam: BP 120/79   Pulse 87   Ht 5' 3"  (1.6 m)   Wt 215 lb (97.5 kg)   LMP 10/14/2021  (Approximate)   BMI 38.09 kg/m   Constitutional:  Alert and oriented, No acute distress. HEENT: [place] AT, moist mucus membranes.  Trachea midline, no masses. Cardiovascular: No clubbing, cyanosis, or edema. Respiratory: Normal respiratory effort, no increased work of breathing. GI: Abdomen is soft, nontender, nondistended, no abdominal masses GU: No CVA tenderness Skin: No rashes, bruises or suspicious lesions. Neurologic: Grossly intact, no focal deficits, moving all 4 extremities. Psychiatric: Normal mood and affect.  Laboratory Data:  Urinalysis Dipstick 3+ blood/1+ protein Microscopy 3-10 RBC/>10 epis   Assessment & Plan:    1. Hematuria, unspecified type Based on American Urological Association practice guidelines asymptomatic microhematuria [facility]) is defined as three or greater red blood cells per high powered field on a properly collected urinary specimen in the absence of an obvious benign cause. A positive dipstick does not define AMH, and evaluation should be based solely on findings from microscopic examination of urinary sediment and not on a dipstick reading. Although UA today showed 3-10 RBCs there were >10 epis indicating vaginal contamination Since she does have urinary frequency and urgency will schedule cystoscopy to evaluate lower tracts. Repeat urinalysis prior to cystoscopy and if microhematuria notified and a properly collected specimen will schedule renal ultrasound.   [name], [place] [address], [place],  [address] [phone]

## 2022-01-06 ENCOUNTER — Encounter: Payer: Self-pay | Admitting: Urology

## 2022-01-06 ENCOUNTER — Ambulatory Visit: Payer: 59 | Admitting: Urology

## 2022-01-06 ENCOUNTER — Other Ambulatory Visit: Payer: 59 | Admitting: Urology

## 2022-01-06 VITALS — BP 117/82 | HR 88 | Ht 65.0 in | Wt 214.0 lb

## 2022-01-06 DIAGNOSIS — R319 Hematuria, unspecified: Secondary | ICD-10-CM

## 2022-01-06 DIAGNOSIS — R3129 Other microscopic hematuria: Secondary | ICD-10-CM

## 2022-01-06 LAB — URINALYSIS, COMPLETE
Bilirubin, UA: NEGATIVE
Glucose, UA: NEGATIVE
Leukocytes,UA: NEGATIVE
Nitrite, UA: NEGATIVE
Specific Gravity, UA: 1.03 (ref 1.005–1.030)
Urobilinogen, Ur: 0.2 mg/dL (ref 0.2–1.0)
pH, UA: 5.5 (ref 5.0–7.5)

## 2022-01-06 LAB — MICROSCOPIC EXAMINATION

## 2022-01-07 ENCOUNTER — Encounter: Payer: Self-pay | Admitting: Urology

## 2022-01-07 NOTE — Progress Notes (Signed)
   01/07/22  CC:  Chief Complaint  Patient presents with   Cysto    HPI: Refer to my previous note 12/02/2021.  Repeat urinalysis today shows 11-30 RBCs and no significant epithelial cells.  Blood pressure 117/82, pulse 88, height 5\' 5"  (1.651 m), weight 214 lb (97.1 kg). NED. A&Ox3.   No respiratory distress   Abd soft, NT, ND Normal external genitalia with patent urethral meatus  Cystoscopy Procedure Note  Patient identification was confirmed, informed consent was obtained, and patient was prepped using Betadine solution.  Lidocaine jelly was administered per urethral meatus.    Procedure: - Flexible cystoscope introduced, without any difficulty.   - Thorough search of the bladder revealed:    normal urethral meatus    normal urothelium    no stones    no ulcers     no tumors    no urethral polyps    no trabeculation  - Ureteral orifices were normal in position and appearance.  Post-Procedure: - Patient tolerated the procedure well  Assessment/ Plan: No mucosal abnormalities on cystoscopy With today's UA the AUA hematuria risk stratification is intermediate We discussed upper tract imaging with renal ultrasound is recommended for intermediate risk hematuria and order was placed Will call with results and if no significant abnormality 1 year follow-up with UA    , MD

## 2022-01-23 ENCOUNTER — Encounter: Payer: Self-pay | Admitting: Family Medicine

## 2022-01-23 ENCOUNTER — Other Ambulatory Visit (HOSPITAL_COMMUNITY)
Admission: RE | Admit: 2022-01-23 | Discharge: 2022-01-23 | Disposition: A | Discharge status: Home / Self Care | Payer: Commercial Managed Care - HMO | Source: Ambulatory Visit | Attending: Family Medicine | Admitting: Family Medicine

## 2022-01-23 ENCOUNTER — Ambulatory Visit: Payer: Commercial Managed Care - HMO | Attending: Family Medicine | Admitting: Family Medicine

## 2022-01-23 VITALS — BP 114/74 | HR 75 | Temp 98.3°F | Ht 65.0 in | Wt 220.6 lb

## 2022-01-23 DIAGNOSIS — K1120 Sialoadenitis, unspecified: Secondary | ICD-10-CM

## 2022-01-23 DIAGNOSIS — Z124 Encounter for screening for malignant neoplasm of cervix: Secondary | ICD-10-CM | POA: Insufficient documentation

## 2022-01-23 DIAGNOSIS — Z1231 Encounter for screening mammogram for malignant neoplasm of breast: Secondary | ICD-10-CM | POA: Diagnosis not present

## 2022-01-23 DIAGNOSIS — Z0000 Encounter for general adult medical examination without abnormal findings: Secondary | ICD-10-CM

## 2022-01-23 MED ORDER — CETIRIZINE HCL 10 MG PO TABS: 10.0000 mg | ORAL_TABLET | Freq: Every day | ORAL | 0 refills | Status: DC | PRN

## 2022-01-23 NOTE — Patient Instructions (Signed)

## 2022-01-23 NOTE — Progress Notes (Signed)
Having pain on left side of face.

## 2022-01-23 NOTE — Progress Notes (Signed)
Subjective:  Patient ID: Victoria Schneider, female    DOB: [birth year]  Age: 41 y.o. MRN: [id number]  CC: Gynecologic Exam   HPI Victoria Schneider is a 41 y.o. year old female patient of Dr. Joya Gaskins with a history of type 2 diabetes mellitus, GERD who presents for gynecologic exam.  Interval History: She is due for Pap smear and breast cancer screening.  She also complains of pain in her left submandibular area the last 2 days and thinks it may be inflamed.  She has had some left ear itching and fullness.  Also noticed having some nasal congestion through the night. Past Medical History:  Diagnosis Date   Acute cystitis with hematuria 03/10/2021   Elevated LFTs 06/09/2020   GERD (gastroesophageal reflux disease)    Type 2 diabetes mellitus with hyperglycemia, without long-term current use of insulin ([place]) 05/05/2020    Past Surgical History:  Procedure Laterality Date   NO PAST SURGERIES      Family History  Problem Relation Age of Onset   Diabetes Father    Hypertension Father    Diabetes Brother    Hypertension Mother    Depression Daughter     Social History   Socioeconomic History   Marital status: Single    Spouse name: Not on file   Number of children: Not on file   Years of education: Not on file   Highest education level: Not on file  Occupational History   Not on file  Tobacco Use   Smoking status: Never   Smokeless tobacco: Never  Vaping Use   Vaping Use: Never used  Substance and Sexual Activity   Alcohol use: Not Currently    Comment: rarely   Drug use: No   Sexual activity: Yes    Birth control/protection: None  Other Topics Concern   Not on file  Social History Narrative   Not on file   Social Determinants of Health   Financial Resource Strain: Not on file  Food Insecurity: Not on file  Transportation Needs: Not on file  Physical Activity: Not on file  Stress: Not on file  Social Connections: Not on file    Allergies   Allergen Reactions   Penicillins Rash and Other (See Comments)    Has patient had a PCN reaction causing immediate rash, facial/tongue/throat swelling, SOB or lightheadedness with hypotension: No Has patient had a PCN reaction causing severe rash involving mucus membranes or skin necrosis: No Has patient had a PCN reaction that required hospitalization No Has patient had a PCN reaction occurring within the last 10 years: Yes If all of the above answers are "NO", then may proceed with Cephalosporin use.     Outpatient Medications Prior to Visit  Medication Sig Dispense Refill   acetaminophen (TYLENOL) 325 MG tablet Take 650 mg by mouth every 6 (six) hours as needed.     Blood Glucose Monitoring Suppl (TRUE METRIX METER) w/Device KIT 1 each by Does not apply route in the morning and at bedtime. 1 kit 0   ferrous sulfate 325 (65 FE) MG tablet Take 1 tablet (325 mg total) by mouth daily. 100 tablet 1   fluticasone (FLONASE) 50 MCG/ACT nasal spray PLACE 2 SPRAYS INTO BOTH NOSTRILS DAILY. 16 g 6   glucose blood (TRUE METRIX BLOOD GLUCOSE TEST) test strip Use as instructed 100 each 12   ibuprofen (ADVIL) 800 MG tablet Take 1 tablet (800 mg total) by mouth 3 (three) times daily. Prn pain 30  tablet 0   metFORMIN (GLUCOPHAGE) 500 MG tablet 2 tabs daily with dinner 60 tablet 3   methocarbamol (ROBAXIN) 500 MG tablet Take 2 tablets (1,000 mg total) by mouth every 8 (eight) hours as needed for muscle spasms. 90 tablet 0   TRUEplus Lancets 28G MISC USE AS DIRECTED IN THE MORNING AND AT BEDTIME 100 each 2   Vitamin D, Ergocalciferol, (DRISDOL) 1.25 MG (50000 UNIT) CAPS capsule Take 1 capsule (50,000 Units total) by mouth every 7 (seven) days. 16 capsule 3   cetirizine (ZYRTEC) 10 MG tablet Take 10 mg by mouth daily as needed for allergies.     No facility-administered medications prior to visit.     ROS Review of Systems  Constitutional:  Negative for activity change, appetite change and fatigue.   HENT:  Positive for ear pain. Negative for congestion, sinus pressure and sore throat.   Eyes:  Negative for visual disturbance.  Respiratory:  Negative for cough, chest tightness, shortness of breath and wheezing.   Cardiovascular:  Negative for chest pain and palpitations.  Gastrointestinal:  Negative for abdominal distention, abdominal pain and constipation.  Endocrine: Negative for polydipsia.  Genitourinary:  Negative for dysuria and frequency.  Musculoskeletal:  Negative for arthralgias and back pain.  Skin:  Negative for rash.  Neurological:  Negative for tremors, light-headedness and numbness.  Hematological:  Does not bruise/bleed easily.  Psychiatric/Behavioral:  Negative for agitation and behavioral problems.     Objective:  BP 114/74   Pulse 75   Temp 98.3 F (36.8 C) (Oral)   Ht _0  (1.651 m)   Wt 220 lb 9.6 oz (100.1 kg)   SpO2 95%   BMI 36.71 kg/m      01/23/2022   10:48 AM 01/06/2022    1:17 PM 12/02/2021    1:04 PM  BP/Weight  Systolic BP 161 096 045  Diastolic BP 74 82 79  Wt. (Lbs) 220.6 214 215  BMI 36.71 kg/m2 35.61 kg/m2 38.09 kg/m2      Physical Exam Exam conducted with a chaperone present.  Constitutional:      General: She is not in acute distress.    Appearance: She is well-developed. She is not diaphoretic.  HENT:     Head: Normocephalic.     Right Ear: Tympanic membrane and external ear normal.     Left Ear: Tympanic membrane and external ear normal.     Nose: Nose normal.  Eyes:     Conjunctiva/sclera: Conjunctivae normal.     Pupils: Pupils are equal, round, and reactive to light.  Neck:     Vascular: No JVD.     Comments: Slight tenderness in left submandibular region Cardiovascular:     Rate and Rhythm: Normal rate and regular rhythm.     Heart sounds: Normal heart sounds. No murmur heard.    No gallop.  Pulmonary:     Effort: Pulmonary effort is normal. No respiratory distress.     Breath sounds: Normal breath sounds. No  wheezing or rales.  Chest:     Chest wall: No tenderness.  Breasts:    Right: Normal. No mass, nipple discharge or tenderness.     Left: Normal. No mass, nipple discharge or tenderness.  Abdominal:     General: Bowel sounds are normal. There is no distension.     Palpations: Abdomen is soft. There is no mass.     Tenderness: There is no abdominal tenderness.     Hernia: There is no hernia in  the left inguinal area or right inguinal area.  Genitourinary:    General: Normal vulva.     Pubic Area: No rash.      Labia:        Right: No rash.        Left: No rash.      Vagina: Normal.     Cervix: Normal.     Uterus: Normal.      Adnexa: Right adnexa normal and left adnexa normal.       Right: No tenderness.         Left: No tenderness.    Musculoskeletal:        General: No tenderness. Normal range of motion.     Cervical back: Normal range of motion. No tenderness.  Lymphadenopathy:     Cervical: No cervical adenopathy.     Upper Body:     Right upper body: No supraclavicular or axillary adenopathy.     Left upper body: No supraclavicular or axillary adenopathy.  Skin:    General: Skin is warm and dry.  Neurological:     Mental Status: She is alert and oriented to person, place, and time.     Deep Tendon Reflexes: Reflexes are normal and symmetric.        Latest Ref Rng & Units 08/25/2021   10:25 AM 03/09/2021   11:32 AM 10/14/2020   10:47 AM  CMP  Glucose 70 - 99 mg/dL 119  111  106   BUN 6 - 24 mg/dL _0 Creatinine 0.57 - 1.00 mg/dL 0.67  0.59  0.58   Sodium 134 - 144 mmol/L 139  140  139   Potassium 3.5 - 5.2 mmol/L 4.4  4.7  4.3   Chloride 96 - 106 mmol/L 102  104  103   CO2 20 - 29 mmol/L _1 Calcium 8.7 - 10.2 mg/dL 9.7  9.3  8.8   Total Protein 6.0 - 8.5 g/dL 7.4  7.2  7.2   Total Bilirubin 0.0 - 1.2 mg/dL 0.6  0.6  0.5   Alkaline Phos 44 - 121 IU/L 141  135  142   AST 0 - 40 IU/L 22  25  43   ALT 0 - 32 IU/L 26  27  52     Lipid Panel      Component Value Date/Time   CHOL 175 05/26/2020 1619   TRIG 93 05/26/2020 1619   HDL 56 05/26/2020 1619   CHOLHDL 3.1 05/26/2020 1619   LDLCALC 102 (H) 05/26/2020 1619    CBC    Component Value Date/Time   WBC 8.4 08/25/2021 1025   WBC 9.7 07/22/2017 0243   RBC 5.07 08/25/2021 1025   RBC 4.79 07/22/2017 0243   HGB 12.0 08/25/2021 1025   HCT 38.6 08/25/2021 1025   PLT 314 08/25/2021 1025   MCV 76 (L) 08/25/2021 1025   MCH 23.7 (L) 08/25/2021 1025   MCH 25.9 (L) 07/22/2017 0243   MCHC 31.1 (L) 08/25/2021 1025   MCHC 31.5 07/22/2017 0243   RDW 15.5 (H) 08/25/2021 1025   LYMPHSABS 3.5 (H) 08/25/2021 1025   MONOABS 0.9 03/17/2012 2154   EOSABS 0.2 08/25/2021 1025   BASOSABS 0.1 08/25/2021 1025    Lab Results  Component Value Date   HGBA1C 6.2 (A) 08/25/2021    Assessment & Plan:  1. Annual physical exam Counseled on 150 minutes of exercise per week, healthy eating (including  decreased daily intake of saturated fats, cholesterol, added sugars, sodium), STI prevention, routine healthcare maintenance.   2. Screening for cervical cancer - Cytology - PAP  3. Encounter for screening mammogram for malignant neoplasm of breast - MM 3D SCREEN BREAST BILATERAL; Future  4. Sialoadenitis of submandibular gland With possible sinus symptoms Advised to use lemon lozenges in addition to antihistamine - cetirizine (ZYRTEC) 10 MG tablet; Take 1 tablet (10 mg total) by mouth daily as needed for allergies.  Dispense: 30 tablet; Refill: 0    Meds ordered this encounter  Medications   cetirizine (ZYRTEC) 10 MG tablet    Sig: Take 1 tablet (10 mg total) by mouth daily as needed for allergies.    Dispense:  30 tablet    Refill:  0    Follow-up: Return in about 2 months (around 03/25/2022) for Medical conditions with PCP.       Charlott Rakes, MD, FAAFP. [facility] and [place], [place]   01/23/2022, 11:06 AM

## 2022-01-25 ENCOUNTER — Ambulatory Visit (HOSPITAL_COMMUNITY): Admission: RE | Admit: 2022-01-25 | Payer: 59 | Source: Ambulatory Visit

## 2022-01-25 LAB — CYTOLOGY - PAP
Comment: NEGATIVE
Diagnosis: NEGATIVE
Diagnosis: REACTIVE
High risk HPV: NEGATIVE

## 2022-01-30 ENCOUNTER — Ambulatory Visit (HOSPITAL_COMMUNITY): Admission: RE | Admit: 2022-01-30 | Payer: Self-pay | Source: Ambulatory Visit

## 2022-02-05 ENCOUNTER — Ambulatory Visit (INDEPENDENT_AMBULATORY_CARE_PROVIDER_SITE_OTHER): Payer: Commercial Managed Care - HMO

## 2022-02-05 ENCOUNTER — Ambulatory Visit
Admission: EM | Admit: 2022-02-05 | Discharge: 2022-02-05 | Disposition: A | Discharge status: Home / Self Care | Payer: Commercial Managed Care - HMO | Attending: Emergency Medicine | Admitting: Emergency Medicine

## 2022-02-05 DIAGNOSIS — R310 Gross hematuria: Secondary | ICD-10-CM | POA: Diagnosis present

## 2022-02-05 DIAGNOSIS — N3001 Acute cystitis with hematuria: Secondary | ICD-10-CM | POA: Insufficient documentation

## 2022-02-05 DIAGNOSIS — M545 Low back pain, unspecified: Secondary | ICD-10-CM | POA: Insufficient documentation

## 2022-02-05 DIAGNOSIS — R109 Unspecified abdominal pain: Secondary | ICD-10-CM

## 2022-02-05 DIAGNOSIS — R319 Hematuria, unspecified: Secondary | ICD-10-CM

## 2022-02-05 LAB — POCT URINALYSIS DIP (MANUAL ENTRY)
Bilirubin, UA: NEGATIVE
Glucose, UA: NEGATIVE mg/dL
Ketones, POC UA: NEGATIVE mg/dL
Leukocytes, UA: NEGATIVE
Nitrite, UA: NEGATIVE
Protein Ur, POC: NEGATIVE mg/dL
Spec Grav, UA: 1.01 (ref 1.010–1.025)
Urobilinogen, UA: 0.2 E.U./dL
pH, UA: 6.5 (ref 5.0–8.0)

## 2022-02-05 LAB — POCT URINE PREGNANCY: Preg Test, Ur: NEGATIVE

## 2022-02-05 MED ORDER — CEFTRIAXONE SODIUM 1 G IJ SOLR: 1.0000 g | Freq: Once | INTRAMUSCULAR | Status: DC

## 2022-02-05 MED ORDER — TAMSULOSIN HCL 0.4 MG PO CAPS: 0.4000 mg | ORAL_CAPSULE | Freq: Every day | ORAL | 0 refills | Status: AC

## 2022-02-05 MED ORDER — PHENAZOPYRIDINE HCL 200 MG PO TABS
200.0000 mg | ORAL_TABLET | Freq: Three times a day (TID) | ORAL | 0 refills | Status: DC | PRN
Start: 2022-02-05 — End: 2022-03-16

## 2022-02-05 MED ORDER — IBUPROFEN 600 MG PO TABS: 600.0000 mg | ORAL_TABLET | Freq: Four times a day (QID) | ORAL | 0 refills | Status: DC | PRN

## 2022-02-05 NOTE — Discharge Instructions (Addendum)
Your x-ray is negative for any obvious stone, however, it does not pick up all kidney stones.  Take 600 mg of ibuprofen combined with 1000 mg of Tylenol together.  This will help with pain.  Flomax will help you pass any possible kidney stone, and the Pyridium will turn your urine orange, but will help with your urinary symptoms.  I have sent your urine off for culture to make sure that there is not a urinary tract infection.  We will try an antibiotics if it comes back positive for UTI.  Follow-up with your urologist as scheduled, go to the ER for the signs and symptoms we discussed

## 2022-02-05 NOTE — ED Provider Notes (Signed)
HPI  SUBJECTIVE:  Victoria Schneider is a 41 y.o. female who presents with 4 days of dysuria, bilateral low back pain, urinary urgency, frequency, odorous urine, hematuria, pelvic pain.  No fevers, nausea, vomiting, cloudy urine, flank pain, vaginal odor, bleeding, discharge.  She is sexually active with her husband, who is asymptomatic.  STDs are not a concern today.  No antibiotics in the past month.  She took Tylenol within 6 hours of evaluation.  She states symptoms started after having menses.  She tried Azo, Tylenol, 800 mg of ibuprofen with improvement in her symptoms.  Symptoms are worse with lying down.  She has a past medical history of frequent UTIs and is followed by urology.  She has a renal ultrasound scheduled on 8/23.  She also has a history of pyelonephritis, diabetes.  No history of nephrolithiasis, BV, hypertension.  LMP: Last week.  Denies the possibility of being pregnant.  [facility]    Past Medical History:  Diagnosis Date   Acute cystitis with hematuria 03/10/2021   Elevated LFTs 06/09/2020   GERD (gastroesophageal reflux disease)    Type 2 diabetes mellitus with hyperglycemia, without long-term current use of insulin ([place]) 05/05/2020    Past Surgical History:  Procedure Laterality Date   NO PAST SURGERIES      Family History  Problem Relation Age of Onset   Diabetes Father    Hypertension Father    Diabetes Brother    Hypertension Mother    Depression Daughter     Social History   Tobacco Use   Smoking status: Never   Smokeless tobacco: Never  Vaping Use   Vaping Use: Never used  Substance Use Topics   Alcohol use: Not Currently    Comment: rarely   Drug use: No    No current facility-administered medications for this encounter.  Current Outpatient Medications:    ferrous sulfate 325 (65 FE) MG tablet, Take 1 tablet (325 mg total) by mouth daily., Disp: 100 tablet, Rfl: 1   ibuprofen (ADVIL) 600 MG tablet, Take 1  tablet (600 mg total) by mouth every 6 (six) hours as needed., Disp: 30 tablet, Rfl: 0   metFORMIN (GLUCOPHAGE) 500 MG tablet, 2 tabs daily with dinner, Disp: 60 tablet, Rfl: 3   phenazopyridine (PYRIDIUM) 200 MG tablet, Take 1 tablet (200 mg total) by mouth 3 (three) times daily as needed for pain., Disp: 6 tablet, Rfl: 0   tamsulosin (FLOMAX) 0.4 MG CAPS capsule, Take 1 capsule (0.4 mg total) by mouth at bedtime for 7 days., Disp: 7 capsule, Rfl: 0   acetaminophen (TYLENOL) 325 MG tablet, Take 650 mg by mouth every 6 (six) hours as needed., Disp: , Rfl:    Blood Glucose Monitoring Suppl (TRUE METRIX METER) w/Device KIT, 1 each by Does not apply route in the morning and at bedtime., Disp: 1 kit, Rfl: 0   cetirizine (ZYRTEC) 10 MG tablet, Take 1 tablet (10 mg total) by mouth daily as needed for allergies., Disp: 30 tablet, Rfl: 0   fluticasone (FLONASE) 50 MCG/ACT nasal spray, PLACE 2 SPRAYS INTO BOTH NOSTRILS DAILY., Disp: 16 g, Rfl: 6   glucose blood (TRUE METRIX BLOOD GLUCOSE TEST) test strip, Use as instructed, Disp: 100 each, Rfl: 12   methocarbamol (ROBAXIN) 500 MG tablet, Take 2 tablets (1,000 mg total) by mouth every 8 (eight) hours as needed for muscle spasms., Disp: 90 tablet, Rfl: 0   TRUEplus Lancets 28G MISC, USE AS DIRECTED IN  THE MORNING AND AT BEDTIME, Disp: 100 each, Rfl: 2   Vitamin D, Ergocalciferol, (DRISDOL) 1.25 MG (50000 UNIT) CAPS capsule, Take 1 capsule (50,000 Units total) by mouth every 7 (seven) days., Disp: 16 capsule, Rfl: 3  Allergies  Allergen Reactions   Penicillins Rash and Other (See Comments)    Has patient had a PCN reaction causing immediate rash, facial/tongue/throat swelling, SOB or lightheadedness with hypotension: No Has patient had a PCN reaction causing severe rash involving mucus membranes or skin necrosis: No Has patient had a PCN reaction that required hospitalization No Has patient had a PCN reaction occurring within the last 10 years: Yes If all of  the above answers are "NO", then may proceed with Cephalosporin use.      ROS  As noted in HPI.   Physical Exam  BP 139/86 (BP Location: Left Arm)   Pulse 98   Temp 98 F (36.7 C) (Oral)   Resp 18   LMP 01/28/2022   SpO2 98%   Constitutional: Well developed, well nourished, no acute distress Eyes:  EOMI, conjunctiva normal bilaterally HENT: Normocephalic, atraumatic,mucus membranes moist Respiratory: Normal inspiratory effort Cardiovascular: Normal rate GI: nondistended.  Positive suprapubic, right flank tenderness.  No other abdominal tenderness Back: Bilateral CVAT.  No paralumbar tenderness. skin: No rash, skin intact Musculoskeletal: no deformities Neurologic: Alert & oriented x 3, no focal neuro deficits Psychiatric: Speech and behavior appropriate   ED Course   Medications - No data to display   Orders Placed This Encounter  Procedures   Urine Culture    Standing Status:   Standing    Number of Occurrences:   1    Order Specific Question:   Indication    Answer:   Acute gross hematuria   DG Abd 1 View    Standing Status:   Standing    Number of Occurrences:   1    Order Specific Question:   Reason for Exam (SYMPTOM  OR DIAGNOSIS REQUIRED)    Answer:   Hematuria, back pain, rule out nephrolithiasis   POCT urinalysis dipstick    Standing Status:   Standing    Number of Occurrences:   1   POCT urine pregnancy    Standing Status:   Standing    Number of Occurrences:   1    Results for orders placed or performed during the hospital encounter of 02/05/22 (from the past 24 hour(s))  POCT urinalysis dipstick     Status: Abnormal   Collection Time: 02/05/22  1:23 PM  Result Value Ref Range   Color, UA yellow yellow   Clarity, UA clear clear   Glucose, UA negative negative mg/dL   Bilirubin, UA negative negative   Ketones, POC UA negative negative mg/dL   Spec Grav, UA 1.010 1.010 - 1.025   Blood, UA moderate (A) negative   pH, UA 6.5 5.0 - 8.0    Protein Ur, POC negative negative mg/dL   Urobilinogen, UA 0.2 0.2 or 1.0 E.U./dL   Nitrite, UA Negative Negative   Leukocytes, UA Negative Negative  POCT urine pregnancy     Status: None   Collection Time: 02/05/22  1:23 PM  Result Value Ref Range   Preg Test, Ur Negative Negative   DG Abd 1 View  Result Date: 02/05/2022 CLINICAL DATA:  Hematuria.  Back pain and right flank pain. EXAM: ABDOMEN - 1 VIEW COMPARISON:  CT, 08/09/2016. FINDINGS: The bowel gas pattern is normal. Mild colonic stool burden. No radio-opaque  calculi or other significant radiographic abnormality are seen. Skeletal structures are unremarkable. IMPRESSION: Negative. Electronically Signed   By: Lajean Manes M.D.   On: 02/05/2022 13:40    ED Clinical Impression  1. Acute cystitis with hematuria   2. Gross hematuria   3. Acute bilateral low back pain without sciatica      ED Assessment/Plan  Patient has suprapubic, right flank and bilateral CVAT.  She has moderate hematuria, but no esterase, nitrates.  Urine pregnancy is negative.  Concern for nephrolithiasis.  Will check KUB.  Reviewed imaging independently.  No nephrolithiasis.  See radiology report for full details.  Discussed low sensitivity for nephrolithiasis on KUB with patient.  Discussed that CT is imaging modality of choice.  Holding off on antibiotics now as she has no leukocytes or nitrates in her urine.  Sending urine off for culture to confirm absence of infection.  We will base further treatment off of culture results.  In the meantime, treat as renal colic with home with Pyridium, Tylenol/ibuprofen and Flomax, continue pushing fluids.  Follow-up with urology for renal ultrasound scheduled for this Thursday.  ER return precautions given.  Discussed labs, imaging, MDM, treatment plan, and plan for follow-up with patient. Discussed sn/sx that should prompt return to the ED. patient agrees with plan.   Meds ordered this encounter  Medications    DISCONTD: cefTRIAXone (ROCEPHIN) injection 1 g   tamsulosin (FLOMAX) 0.4 MG CAPS capsule    Sig: Take 1 capsule (0.4 mg total) by mouth at bedtime for 7 days.    Dispense:  7 capsule    Refill:  0   ibuprofen (ADVIL) 600 MG tablet    Sig: Take 1 tablet (600 mg total) by mouth every 6 (six) hours as needed.    Dispense:  30 tablet    Refill:  0   phenazopyridine (PYRIDIUM) 200 MG tablet    Sig: Take 1 tablet (200 mg total) by mouth 3 (three) times daily as needed for pain.    Dispense:  6 tablet    Refill:  0      *This clinic note was created using [employer]. Therefore, there may be occasional mistakes despite careful proofreading.  ?    Melynda Ripple, MD 02/07/22 [phone]

## 2022-02-05 NOTE — ED Triage Notes (Signed)
Pt c/o rt flank pain, urinary freq and pain. Pt states she has noted blood in her urine.

## 2022-02-06 LAB — URINE CULTURE

## 2022-02-08 ENCOUNTER — Ambulatory Visit (HOSPITAL_COMMUNITY)
Admission: RE | Admit: 2022-02-08 | Discharge: 2022-02-08 | Disposition: A | Discharge status: Home / Self Care | Payer: 59 | Source: Ambulatory Visit | Attending: Urology | Admitting: Urology

## 2022-02-08 DIAGNOSIS — R3129 Other microscopic hematuria: Secondary | ICD-10-CM | POA: Insufficient documentation

## 2022-02-08 NOTE — Progress Notes (Unsigned)
Patient ID: Victoria Schneider, female   DOB: Aug 30, 1980, 41 y.o.   MRN: [id number]   After UC visit 02/05/2022  From UC note:  UC Clinical Impression   1. Acute cystitis with hematuria   2. Gross hematuria   3. Acute bilateral low back pain without sciatica       ED Assessment/Plan   Patient has suprapubic, right flank and bilateral CVAT.  She has moderate hematuria, but no esterase, nitrates.  Urine pregnancy is negative.  Concern for nephrolithiasis.  Will check KUB.   Reviewed imaging independently.  No nephrolithiasis.  See radiology report for full details.   Discussed low sensitivity for nephrolithiasis on KUB with patient.  Discussed that CT is imaging modality of choice.  Holding off on antibiotics now as she has no leukocytes or nitrates in her urine.  Sending urine off for culture to confirm absence of infection.  We will base further treatment off of culture results.  In the meantime, treat as renal colic with home with Pyridium, Tylenol/ibuprofen and Flomax, continue pushing fluids.  Follow-up with urology for renal ultrasound scheduled for this Thursday.  ER return precautions given.

## 2022-02-09 ENCOUNTER — Telehealth: Payer: Self-pay | Admitting: *Deleted

## 2022-02-09 ENCOUNTER — Ambulatory Visit: Payer: Self-pay | Attending: Physician Assistant | Admitting: Physician Assistant

## 2022-02-09 ENCOUNTER — Encounter: Payer: Self-pay | Admitting: Physician Assistant

## 2022-02-09 ENCOUNTER — Other Ambulatory Visit (HOSPITAL_COMMUNITY)
Admission: RE | Admit: 2022-02-09 | Discharge: 2022-02-09 | Disposition: A | Discharge status: Home / Self Care | Payer: Commercial Managed Care - HMO | Source: Ambulatory Visit | Attending: Physician Assistant | Admitting: Physician Assistant

## 2022-02-09 VITALS — BP 120/82 | HR 83 | Temp 98.4°F | Resp 16 | Ht 65.0 in | Wt 222.0 lb

## 2022-02-09 DIAGNOSIS — N898 Other specified noninflammatory disorders of vagina: Secondary | ICD-10-CM | POA: Diagnosis present

## 2022-02-09 DIAGNOSIS — R319 Hematuria, unspecified: Secondary | ICD-10-CM

## 2022-02-09 LAB — POCT URINALYSIS DIP (CLINITEK)
Bilirubin, UA: NEGATIVE
Glucose, UA: NEGATIVE mg/dL
Ketones, POC UA: NEGATIVE mg/dL
Leukocytes, UA: NEGATIVE
Nitrite, UA: NEGATIVE
POC PROTEIN,UA: NEGATIVE
Spec Grav, UA: 1.02 (ref 1.010–1.025)
Urobilinogen, UA: 0.2 E.U./dL
pH, UA: 6.5 (ref 5.0–8.0)

## 2022-02-09 NOTE — Telephone Encounter (Signed)
-----   Message from Riki Altes, MD sent at 02/09/2022 10:42 AM EDT ----- Renal ultrasound showed no abnormalities

## 2022-02-09 NOTE — Telephone Encounter (Signed)
Notified patient as instructed, patient pleased °

## 2022-02-10 LAB — CERVICOVAGINAL ANCILLARY ONLY
Bacterial Vaginitis (gardnerella): NEGATIVE
Candida Glabrata: NEGATIVE
Candida Vaginitis: NEGATIVE
Chlamydia: NEGATIVE
Comment: NEGATIVE
Comment: NEGATIVE
Comment: NEGATIVE
Comment: NEGATIVE
Comment: NEGATIVE
Comment: NORMAL
Neisseria Gonorrhea: NEGATIVE
Trichomonas: NEGATIVE

## 2022-02-15 ENCOUNTER — Telehealth: Payer: Self-pay

## 2022-02-15 NOTE — Telephone Encounter (Signed)
Pt returned our call.  Shared provider's note with pt. Pt states that she still has blood in her urine, she has urinary frequency and she has a lot of pain in her back. PT feels that this is a kidney infection.  Pt states that she will go to [facility] tomorrow for treatment. PT states that when she had similar s/s , she went to UC and they gave her an antibiotic which gave her relief and was symptom free for 3-4 months.   Please advise.    Anders Simmonds, PA-C  02/14/2022 11:58 AM EDT     Please call patient.  Her vaginal swabs were normal.  No yeast or any infection.  Thanks, Georgian Co,

## 2022-02-16 ENCOUNTER — Encounter: Payer: Self-pay | Admitting: Emergency Medicine

## 2022-02-16 ENCOUNTER — Other Ambulatory Visit: Payer: Self-pay

## 2022-02-16 ENCOUNTER — Ambulatory Visit
Admission: EM | Admit: 2022-02-16 | Discharge: 2022-02-16 | Disposition: A | Discharge status: Home / Self Care | Payer: Commercial Managed Care - HMO | Attending: Physician Assistant | Admitting: Physician Assistant

## 2022-02-16 DIAGNOSIS — R310 Gross hematuria: Secondary | ICD-10-CM | POA: Diagnosis present

## 2022-02-16 DIAGNOSIS — R399 Unspecified symptoms and signs involving the genitourinary system: Secondary | ICD-10-CM | POA: Diagnosis present

## 2022-02-16 LAB — POCT URINALYSIS DIP (MANUAL ENTRY)
Bilirubin, UA: NEGATIVE
Glucose, UA: NEGATIVE mg/dL
Ketones, POC UA: NEGATIVE mg/dL
Leukocytes, UA: NEGATIVE
Nitrite, UA: NEGATIVE
Protein Ur, POC: NEGATIVE mg/dL
Spec Grav, UA: 1.02 (ref 1.010–1.025)
Urobilinogen, UA: 0.2 E.U./dL
pH, UA: 6 (ref 5.0–8.0)

## 2022-02-16 MED ORDER — SULFAMETHOXAZOLE-TRIMETHOPRIM 800-160 MG PO TABS: 1.0000 | ORAL_TABLET | Freq: Two times a day (BID) | ORAL | 0 refills | Status: AC

## 2022-02-16 NOTE — ED Provider Notes (Signed)
[name]    CSN: [id number] Arrival date & time: 02/16/22  1712      History   Chief Complaint Chief Complaint  Patient presents with   Urinary Tract Infection    HPI Victoria Schneider is a 41 y.o. female.   Patient presents today with a several week history of urinary symptoms.  Reports frequency, urgency, feeling that she is not completely emptying her bladder, dysuria, back pain.  Denies any fever, nausea, vomiting.  She does report ongoing hematuria and is followed by urologist.  She had a kidney ultrasound recently that was negative.  She has had urinary tract infections in the past that have resolved with antibiotics temporarily.  She was last seen by our clinic on 02/05/2022 at which point she did not have any significant leukocytosis or nitrates.  Symptoms were associated with possible nephrolithiasis and she was started on Flomax, ibuprofen, Pyridium.  Urine culture was obtained at that visit but grew multiple species and it was never recollected.  She denies any vaginal symptoms and has no concern for STI.  She is confident that she is not pregnant.    Past Medical History:  Diagnosis Date   Acute cystitis with hematuria 03/10/2021   Elevated LFTs 06/09/2020   GERD (gastroesophageal reflux disease)    Type 2 diabetes mellitus with hyperglycemia, without long-term current use of insulin ([place]) 05/05/2020    Patient Active Problem List   Diagnosis Date Noted   Hematuria 12/01/2021   Cervical cancer screening 03/10/2021   Menorrhagia with regular cycle 03/10/2021   Iron deficiency anemia due to chronic blood loss 03/10/2021   BMI 37.0-37.9, adult 03/10/2021   Vitamin D deficiency 03/10/2021   Hypersomnia 06/09/2020   Type 2 diabetes mellitus with hyperglycemia, without long-term current use of insulin ([place]) 05/05/2020    Past Surgical History:  Procedure Laterality Date   NO PAST SURGERIES      OB History     Gravida  4   Para  4   Term   4   Preterm      AB      Living  4      SAB      IAB      Ectopic      Multiple  0   Live Births  4            Home Medications    Prior to Admission medications   Medication Sig Start Date End Date Taking? Authorizing Provider  sulfamethoxazole-trimethoprim (BACTRIM DS) 800-160 MG tablet Take 1 tablet by mouth 2 (two) times daily for 5 days. 02/16/22 02/21/22 Yes Kierstyn Baranowski, Derry Skill, PA-C  acetaminophen (TYLENOL) 325 MG tablet Take 650 mg by mouth every 6 (six) hours as needed.    [provider]  Blood Glucose Monitoring Suppl (TRUE METRIX METER) w/Device KIT 1 each by Does not apply route in the morning and at bedtime. Patient not taking: Reported on 02/09/2022 05/05/20   Argentina Donovan, PA-C  cetirizine (ZYRTEC) 10 MG tablet Take 1 tablet (10 mg total) by mouth daily as needed for allergies. 01/23/22   Charlott Rakes, MD  ferrous sulfate 325 (65 FE) MG tablet Take 1 tablet (325 mg total) by mouth daily. 12/01/21   Elsie Stain, MD  fluticasone (FLONASE) 50 MCG/ACT nasal spray PLACE 2 SPRAYS INTO BOTH NOSTRILS DAILY. Patient not taking: Reported on 02/09/2022 12/01/21 12/01/22  Elsie Stain, MD  glucose blood (TRUE METRIX BLOOD GLUCOSE TEST)  test strip Use as instructed Patient not taking: Reported on 02/09/2022 12/01/21   Elsie Stain, MD  ibuprofen (ADVIL) 600 MG tablet Take 1 tablet (600 mg total) by mouth every 6 (six) hours as needed. 02/05/22   Melynda Ripple, MD  metFORMIN (GLUCOPHAGE) 500 MG tablet 2 tabs daily with dinner 12/01/21   Elsie Stain, MD  methocarbamol (ROBAXIN) 500 MG tablet Take 2 tablets (1,000 mg total) by mouth every 8 (eight) hours as needed for muscle spasms. Patient not taking: Reported on 02/09/2022 08/25/21   Argentina Donovan, PA-C  phenazopyridine (PYRIDIUM) 200 MG tablet Take 1 tablet (200 mg total) by mouth 3 (three) times daily as needed for pain. Patient not taking: Reported on 02/09/2022 02/05/22   Melynda Ripple,  MD  TRUEplus Lancets 28G MISC USE AS DIRECTED IN THE MORNING AND AT BEDTIME Patient not taking: Reported on 02/09/2022 12/01/21 12/01/22  Elsie Stain, MD  Vitamin D, Ergocalciferol, (DRISDOL) 1.25 MG (50000 UNIT) CAPS capsule Take 1 capsule (50,000 Units total) by mouth every 7 (seven) days. 12/01/21   Elsie Stain, MD  albuterol (VENTOLIN HFA) 108 (90 Base) MCG/ACT inhaler Inhale 1-2 puffs into the lungs every 6 (six) hours as needed for wheezing or shortness of breath. Patient not taking: Reported on 08/06/2019 07/15/19 02/09/20  Hall-Potvin, [place], PA-C  famotidine (PEPCID) 20 MG tablet One after supper Patient not taking: Reported on 08/06/2019 01/23/19 02/09/20  Tanda Rockers, MD    Family History Family History  Problem Relation Age of Onset   Diabetes Father    Hypertension Father    Diabetes Brother    Hypertension Mother    Depression Daughter     Social History Social History   Tobacco Use   Smoking status: Never   Smokeless tobacco: Never  Vaping Use   Vaping Use: Never used  Substance Use Topics   Alcohol use: Not Currently    Comment: rarely   Drug use: No     Allergies   Penicillins   Review of Systems Review of Systems  Constitutional:  Positive for activity change. Negative for appetite change, fatigue and fever.  Respiratory:  Negative for cough and shortness of breath.   Cardiovascular:  Negative for chest pain.  Gastrointestinal:  Positive for abdominal pain. Negative for diarrhea, nausea and vomiting.  Genitourinary:  Positive for dysuria, frequency, hematuria and urgency. Negative for flank pain, vaginal bleeding, vaginal discharge and vaginal pain.  Musculoskeletal:  Positive for back pain. Negative for arthralgias and myalgias.     Physical Exam Triage Vital Signs ED Triage Vitals  Enc Vitals Group     BP 02/16/22 1729 135/82     Pulse Rate 02/16/22 1729 (!) 104     Resp 02/16/22 1729 18     Temp 02/16/22 1729 98.9 F (37.2 C)      Temp Source 02/16/22 1729 Oral     SpO2 02/16/22 1729 97 %     Weight --      Height --      Head Circumference --      Peak Flow --      Pain Score 02/16/22 1727 7     Pain Loc --      Pain Edu? --      Excl. in [place]? --    No data found.  Updated Vital Signs BP 135/82 (BP Location: Left Arm) Comment: large cuff Comment (BP Location): large cuff  Pulse (!) 104   Temp 98.9  F (37.2 C) (Oral)   Resp 18   LMP 02/02/2022   SpO2 97%   Visual Acuity Right Eye Distance:   Left Eye Distance:   Bilateral Distance:    Right Eye Near:   Left Eye Near:    Bilateral Near:     Physical Exam Vitals reviewed.  Constitutional:      General: She is awake. She is not in acute distress.    Appearance: Normal appearance. She is well-developed. She is not ill-appearing.     Comments: Very pleasant female appears stated age no acute distress sitting comfortably in exam room  HENT:     Head: Normocephalic and atraumatic.  Cardiovascular:     Rate and Rhythm: Normal rate and regular rhythm.     Heart sounds: Normal heart sounds, S1 normal and S2 normal. No murmur heard. Pulmonary:     Effort: Pulmonary effort is normal.     Breath sounds: Normal breath sounds. No wheezing, rhonchi or rales.     Comments: Clear to auscultation bilaterally Abdominal:     General: Bowel sounds are normal.     Palpations: Abdomen is soft.     Tenderness: There is abdominal tenderness in the suprapubic area. There is no right CVA tenderness, left CVA tenderness, guarding or rebound.     Comments: Suprapubic tenderness but no significant CVA tenderness.  Psychiatric:        Behavior: Behavior is cooperative.      UC Treatments / Results  Labs (all labs ordered are listed, but only abnormal results are displayed) Labs Reviewed  POCT URINALYSIS DIP (MANUAL ENTRY) - Abnormal; Notable for the following components:      Result Value   Clarity, UA cloudy (*)    Blood, UA moderate (*)    All other  components within normal limits  URINE CULTURE    EKG   Radiology No results found.  Procedures Procedures (including critical care time)  Medications Ordered in UC Medications - No data to display  Initial Impression / Assessment and Plan / UC Course  I have reviewed the triage vital signs and the nursing notes.  Pertinent labs & imaging results that were available during my care of the patient were reviewed by me and considered in my medical decision making (see chart for details).     Patient is mildly tachycardic but otherwise well-appearing and afebrile in clinic.  She has no significant CVA tenderness and has had negative KUB recently so this was not obtained today.  She has been taking treatment for nephrolithiasis without improvement.  UA showed hematuria but was otherwise normal.  Discussed that this is not convincing for an infection but given her clinical presentation including worsening symptoms will start 5-day course of Bactrim DS.  I did recommend that she follow-up closely with her primary care and/or urologist to consider CT scan for further evaluation and management since we do not have the capabilities of arranging this in urgent care.  Urine culture was obtained today we discussed that if nothing grows we would discontinue the antibiotics.  If need to change antibiotics we will contact her.  Recommend close follow-up with your urologist.  Patient denies any vaginal symptoms so STI testing was deferred.  She is to rest and drink plenty of fluids.  Discussed that if anything worsen she needs to go to the emergency room immediately.  Final Clinical Impressions(s) / UC Diagnoses   Final diagnoses:  Gross hematuria  UTI symptoms  Discharge Instructions      We are going to treat you for a urinary tract infection.  Start Bactrim DS twice daily for 5 days.  We are sending this for culture.  If we need to change your antibiotics or stop them we will call you.   Please follow-up closely with your urologist.  If anything worsens you need to be seen immediately.     ED Prescriptions     Medication Sig Dispense Auth. Provider   sulfamethoxazole-trimethoprim (BACTRIM DS) 800-160 MG tablet Take 1 tablet by mouth 2 (two) times daily for 5 days. 10 tablet Nykayla Marcelli, Derry Skill, PA-C      PDMP not reviewed this encounter.   Terrilee Croak, PA-C 02/16/22 1750

## 2022-02-16 NOTE — Discharge Instructions (Signed)
We are going to treat you for a urinary tract infection.  Start Bactrim DS twice daily for 5 days.  We are sending this for culture.  If we need to change your antibiotics or stop them we will call you.  Please follow-up closely with your urologist.  If anything worsens you need to be seen immediately.

## 2022-02-16 NOTE — ED Triage Notes (Signed)
Patient reports being seen for pain with urination.  Patient seen 02/05/2022.  Patient reports she is still hurting, right and left back.  Patient reports she is still urinating too frequently

## 2022-02-17 LAB — URINE CULTURE

## 2022-02-26 NOTE — Progress Notes (Signed)
Established Patient Office Visit  Subjective:  Patient ID: Victoria Schneider, female    DOB: [birth year]  Age: 41 y.o. MRN: [id number]  Virtual Visit via Telephone Note  I connected with Victoria Schneider on 02/27/22 at  2:00 PM EDT by telephone and verified that I am speaking with the correct person using two identifiers.   Consent:  I discussed the limitations, risks, security and privacy concerns of performing an evaluation and management service by telephone and the availability of in person appointments. I also discussed with the patient that there may be a patient responsible charge related to this service. The patient expressed understanding and agreed to proceed.  Location of patient: Patient is at home  Location of provider: I am in my office  Persons participating in the televisit with the patient.   No one else on the phone or call    History of Present Illness:   CC:  Flank pain  HPI 02/2021 ZLATA ALCAIDE presents for primary care follow-up visit last seen December 2021.  Patient has been noting periods of blood in the urine without dysuria.  She was seen previously this year in the emergency room for acute cystitis with hematuria.  Treated with a course of antibiotics for E. coli.  Patient does note excess menses with each menstrual period.  Patient does maintain metformin 1000 mg a.m. 500 mg p.m. for type 2 diabetes.  A1c had been controlled previously.  Blood sugar on arrival today 115 patient has also mild iron deficiency anemia from previous lab testing for which she is on iron supplementation. Patient does have mild vitamin D deficiency and is on vitamin D supplement.  Prior history of hepatic steatosis with elevated liver functions for which use of statin was held.  Patient still has significant obesity BMI of nearly 38.  Patient does state her bilateral wrist pain has been improved suspected to be due to carpal tunnel syndrome and doing well with wrist  splints at night.  Patient also has hypersomnia with excess snoring.  She was not able to follow through with a referral to sleep medicine.  12/01/21 patient did not need interpreter This is a return visit from prior visit in September 2022.  In the interim the patient was seen in May for a bout of sinusitis from which she has resolved.  She does still have some ear popping and was given Zyrtec-D and Flonase by the PA.  Patient also had a visit in March was found to have hematuria and iron deficiency anemia referred to urology she has never been able to complete the appointment as she did not receive a phone call but they state they tried to call with no answer.  She is also had low back pain and neck pain that is gotten worse and her weight is up to 215 pounds.  Note in March with her diabetes she was screened again has an A1c of 6.2 and is on the metformin at 1000 mg daily.  She had low vitamin D levels and was recommended to take vitamin D but she is not actually on any supplements at this time.  Today she needs a urine microalbumin assessed she needs a Pap smear foot exam and needs to get her eye exam performed  Patient still notes some blood in the urine and has occasional vaginal discharge.  There are no other complaints.  She is due a pneumonia vaccine she agrees to receive at this visit.   Below  is assessment from the PA visit in March McClung 08/2021   1. Type 2 diabetes mellitus with hyperglycemia, without long-term current use of insulin (HCC) Controlled on the dose she is actually taking which I will send prescription she is currently taking it now - Glucose (CBG) - HgB A1c - POCT URINALYSIS DIP (CLINITEK) - Comprehensive metabolic panel - metFORMIN (GLUCOPHAGE) 500 MG tablet; 2 tabs daily with dinner  Dispense: 60 tablet; Refill: 3   2. Other microscopic hematuria No infection-increase water intake to 80-100 ounces water daily - POCT URINALYSIS DIP (CLINITEK) - Ambulatory referral to  Urology   3. Dysfunction of both eustachian tubes Flonase sent - fluticasone (FLONASE) 50 MCG/ACT nasal spray; PLACE 2 SPRAYS INTO BOTH NOSTRILS DAILY.  Dispense: 16 g; Refill: 6   4. Iron deficiency anemia due to chronic blood loss - ferrous sulfate 325 (65 FE) MG tablet; Take 1 tablet (325 mg total) by mouth daily.  Dispense: 100 tablet; Refill: 1 - CBC with Differential/Platelet   6. Allergy, subsequent encounter - cetirizine-pseudoephedrine (ZYRTEC-D) 5-120 MG tablet; Take 1 tablet by mouth 2 (two) times daily. prn  Dispense: 60 tablet; Refill: 1 - fluticasone (FLONASE) 50 MCG/ACT nasal spray; PLACE 2 SPRAYS INTO BOTH NOSTRILS DAILY.  Dispense: 16 g; Refill: 6   7. Vitamin D deficiency - Vitamin D, 25-hydroxy   8. Chronic right-sided low back pain without sciatica - ibuprofen (ADVIL) 800 MG tablet; Take 1 tablet (800 mg total) by mouth 3 (three) times daily. Prn pain  Dispense: 30 tablet; Refill: 0 - methocarbamol (ROBAXIN) 500 MG tablet; Take 2 tablets (1,000 mg total) by mouth every 8 (eight) hours as needed for muscle spasms.  Dispense: 90 tablet; Refill: 0       The patient never received her Pap smear never got the visit with gynecology.  9/11 This patient is seen in return follow-up by way of a phone visit she has had continued flank pain bilaterally and was seen previously this year for blood in the urine by urology in [place].  Cystoscopy was done was normal repeat urinalysis showed blood in the urine but urine cultures were negative she had a visit here in our clinic recently had urine cultures done which showed multiple organisms which suggested repeat collection.  She is yet to have a CT renal study but she did have a renal ultrasound that was unremarkable did not show hydronephrosis.  Patient was not able to come to the office today and instead was set up for a virtual visit.  She still having flank pain.  She has been to urgent care more recently.  She was given Bactrim  and this helped to some degree but her symptoms have recurred.  Past Medical History:  Diagnosis Date   Acute cystitis with hematuria 03/10/2021   Elevated LFTs 06/09/2020   GERD (gastroesophageal reflux disease)    Type 2 diabetes mellitus with hyperglycemia, without long-term current use of insulin ([place]) 05/05/2020    Past Surgical History:  Procedure Laterality Date   NO PAST SURGERIES      Family History  Problem Relation Age of Onset   Diabetes Father    Hypertension Father    Diabetes Brother    Hypertension Mother    Depression Daughter     Social History   Socioeconomic History   Marital status: Single    Spouse name: Not on file   Number of children: Not on file   Years of education: Not on file  Highest education level: Not on file  Occupational History   Not on file  Tobacco Use   Smoking status: Never   Smokeless tobacco: Never  Vaping Use   Vaping Use: Never used  Substance and Sexual Activity   Alcohol use: Not Currently    Comment: rarely   Drug use: No   Sexual activity: Yes    Birth control/protection: None  Other Topics Concern   Not on file  Social History Narrative   Not on file   Social Determinants of Health   Financial Resource Strain: Not on file  Food Insecurity: Not on file  Transportation Needs: Not on file  Physical Activity: Not on file  Stress: Not on file  Social Connections: Not on file  Intimate Partner Violence: Not on file    Outpatient Medications Prior to Visit  Medication Sig Dispense Refill   acetaminophen (TYLENOL) 325 MG tablet Take 650 mg by mouth every 6 (six) hours as needed.     Blood Glucose Monitoring Suppl (TRUE METRIX METER) w/Device KIT 1 each by Does not apply route in the morning and at bedtime. (Patient not taking: Reported on 02/09/2022) 1 kit 0   cetirizine (ZYRTEC) 10 MG tablet Take 1 tablet (10 mg total) by mouth daily as needed for allergies. 30 tablet 0   ferrous sulfate 325 (65 FE) MG tablet  Take 1 tablet (325 mg total) by mouth daily. 100 tablet 1   fluticasone (FLONASE) 50 MCG/ACT nasal spray PLACE 2 SPRAYS INTO BOTH NOSTRILS DAILY. (Patient not taking: Reported on 02/09/2022) 16 g 6   glucose blood (TRUE METRIX BLOOD GLUCOSE TEST) test strip Use as instructed (Patient not taking: Reported on 02/09/2022) 100 each 12   ibuprofen (ADVIL) 600 MG tablet Take 1 tablet (600 mg total) by mouth every 6 (six) hours as needed. 30 tablet 0   metFORMIN (GLUCOPHAGE) 500 MG tablet 2 tabs daily with dinner 60 tablet 3   methocarbamol (ROBAXIN) 500 MG tablet Take 2 tablets (1,000 mg total) by mouth every 8 (eight) hours as needed for muscle spasms. (Patient not taking: Reported on 02/09/2022) 90 tablet 0   phenazopyridine (PYRIDIUM) 200 MG tablet Take 1 tablet (200 mg total) by mouth 3 (three) times daily as needed for pain. (Patient not taking: Reported on 02/09/2022) 6 tablet 0   TRUEplus Lancets 28G MISC USE AS DIRECTED IN THE MORNING AND AT BEDTIME (Patient not taking: Reported on 02/09/2022) 100 each 2   Vitamin D, Ergocalciferol, (DRISDOL) 1.25 MG (50000 UNIT) CAPS capsule Take 1 capsule (50,000 Units total) by mouth every 7 (seven) days. 16 capsule 3   No facility-administered medications prior to visit.    Allergies  Allergen Reactions   Penicillins Rash and Other (See Comments)    Has patient had a PCN reaction causing immediate rash, facial/tongue/throat swelling, SOB or lightheadedness with hypotension: No Has patient had a PCN reaction causing severe rash involving mucus membranes or skin necrosis: No Has patient had a PCN reaction that required hospitalization No Has patient had a PCN reaction occurring within the last 10 years: Yes If all of the above answers are "NO", then may proceed with Cephalosporin use.     ROS Review of Systems  Constitutional:  Negative for chills, diaphoresis and fever.  HENT:  Negative for congestion, hearing loss, nosebleeds, sore throat and tinnitus.    Eyes:  Negative for photophobia and redness.  Respiratory:  Negative for cough, shortness of breath, wheezing and stridor.   Cardiovascular:  Negative for chest pain, palpitations and leg swelling.  Gastrointestinal:  Negative for abdominal pain, blood in stool, constipation, diarrhea, nausea and vomiting.  Endocrine: Negative for polydipsia.  Genitourinary:  Positive for dysuria and hematuria. Negative for flank pain, frequency, menstrual problem and urgency.  Musculoskeletal:  Positive for back pain. Negative for myalgias and neck pain.  Skin:  Negative for rash.  Allergic/Immunologic: Negative for environmental allergies.  Neurological:  Negative for dizziness, tremors, seizures, weakness and headaches.  Hematological:  Does not bruise/bleed easily.  Psychiatric/Behavioral:  Negative for suicidal ideas. The patient is not nervous/anxious.       Objective:      LMP 02/02/2022  Wt Readings from Last 3 Encounters:  02/09/22 222 lb (100.7 kg)  01/23/22 220 lb 9.6 oz (100.1 kg)  01/06/22 214 lb (97.1 kg)     Health Maintenance Due  Topic Date Due   OPHTHALMOLOGY EXAM  Never done   HEMOGLOBIN A1C  02/25/2022    There are no preventive care reminders to display for this patient.  Lab Results  Component Value Date   TSH 2.670 10/14/2020   Lab Results  Component Value Date   WBC 8.4 08/25/2021   HGB 12.0 08/25/2021   HCT 38.6 08/25/2021   MCV 76 (L) 08/25/2021   PLT 314 08/25/2021   Lab Results  Component Value Date   NA 139 08/25/2021   K 4.4 08/25/2021   CO2 25 08/25/2021   GLUCOSE 119 (H) 08/25/2021   BUN 6 08/25/2021   CREATININE 0.67 08/25/2021   BILITOT 0.6 08/25/2021   ALKPHOS 141 (H) 08/25/2021   AST 22 08/25/2021   ALT 26 08/25/2021   PROT 7.4 08/25/2021   ALBUMIN 4.3 08/25/2021   CALCIUM 9.7 08/25/2021   ANIONGAP 9 07/22/2017   EGFR 112 08/25/2021   Lab Results  Component Value Date   CHOL 175 05/26/2020   Lab Results  Component Value Date    HDL 56 05/26/2020   Lab Results  Component Value Date   LDLCALC 102 (H) 05/26/2020   Lab Results  Component Value Date   TRIG 93 05/26/2020   Lab Results  Component Value Date   CHOLHDL 3.1 05/26/2020   Lab Results  Component Value Date   HGBA1C 6.2 (A) 08/25/2021      Assessment & Plan:   Problem List Items Addressed This Visit    I personally reviewed all images and lab data in the [facility] system as well as any outside material available during this office visit and agree with the  radiology impressions.   Hematuria Hematuria with flank pain for this we will obtain another urinalysis urine culture and complete set of screening labs patient will also have a CT renal study obtained and based on this further recommendations will follow  Diagnoses and all orders for this visit:  Lower abdominal pain -     CT RENAL STONE STUDY; Future  Flank pain -     Urinalysis; Future -     Urine Culture; Future -     BMP8+eGFR; Future -     CBC with Differential/Platelet; Future  Gross hematuria -     Urinalysis; Future -     Urine Culture; Future -     BMP8+eGFR; Future -     CBC with Differential/Platelet; Future  Type 2 diabetes mellitus with hyperglycemia, without long-term current use of insulin (HCC) -     BMP8+eGFR; Future -     Hemoglobin A1c; Future  Follow Up Instructions: Patient knows she will come in for labs urine studies and then a CT renal stone study will be obtained   I discussed the assessment and treatment plan with the patient. The patient was provided an opportunity to ask questions and all were answered. The patient agreed with the plan and demonstrated an understanding of the instructions.   The patient was advised to call back or seek an in-person evaluation if the symptoms worsen or if the condition fails to improve as anticipated.  I provided 20 minutes of non-face-to-face time during this encounter  including  median intraservice time ,  review of notes, labs, imaging, medications  and explaining diagnosis and management to the patient .    Asencion Noble, MD

## 2022-02-27 ENCOUNTER — Ambulatory Visit: Payer: Commercial Managed Care - HMO | Attending: Critical Care Medicine | Admitting: Critical Care Medicine

## 2022-02-27 ENCOUNTER — Encounter: Payer: Self-pay | Admitting: Critical Care Medicine

## 2022-02-27 DIAGNOSIS — R310 Gross hematuria: Secondary | ICD-10-CM

## 2022-02-27 DIAGNOSIS — H6993 Unspecified Eustachian tube disorder, bilateral: Secondary | ICD-10-CM | POA: Diagnosis not present

## 2022-02-27 DIAGNOSIS — R3129 Other microscopic hematuria: Secondary | ICD-10-CM

## 2022-02-27 DIAGNOSIS — Z7984 Long term (current) use of oral hypoglycemic drugs: Secondary | ICD-10-CM

## 2022-02-27 DIAGNOSIS — E1165 Type 2 diabetes mellitus with hyperglycemia: Secondary | ICD-10-CM

## 2022-02-27 DIAGNOSIS — M5459 Other low back pain: Secondary | ICD-10-CM

## 2022-02-27 DIAGNOSIS — R1030 Lower abdominal pain, unspecified: Secondary | ICD-10-CM

## 2022-02-27 DIAGNOSIS — R109 Unspecified abdominal pain: Secondary | ICD-10-CM

## 2022-02-27 DIAGNOSIS — D500 Iron deficiency anemia secondary to blood loss (chronic): Secondary | ICD-10-CM | POA: Diagnosis not present

## 2022-02-27 DIAGNOSIS — E559 Vitamin D deficiency, unspecified: Secondary | ICD-10-CM

## 2022-02-27 NOTE — Assessment & Plan Note (Signed)
Hematuria with flank pain for this we will obtain another urinalysis urine culture and complete set of screening labs patient will also have a CT renal study obtained and based on this further recommendations will follow

## 2022-02-28 ENCOUNTER — Ambulatory Visit: Payer: Commercial Managed Care - HMO | Attending: Critical Care Medicine

## 2022-02-28 DIAGNOSIS — R310 Gross hematuria: Secondary | ICD-10-CM

## 2022-02-28 DIAGNOSIS — R109 Unspecified abdominal pain: Secondary | ICD-10-CM

## 2022-02-28 DIAGNOSIS — E1165 Type 2 diabetes mellitus with hyperglycemia: Secondary | ICD-10-CM

## 2022-03-01 LAB — URINALYSIS
Bilirubin, UA: NEGATIVE
Glucose, UA: NEGATIVE
Ketones, UA: NEGATIVE
Leukocytes,UA: NEGATIVE
Nitrite, UA: NEGATIVE
Specific Gravity, UA: 1.02 (ref 1.005–1.030)
Urobilinogen, Ur: 0.2 mg/dL (ref 0.2–1.0)
pH, UA: 6.5 (ref 5.0–7.5)

## 2022-03-01 LAB — CBC WITH DIFFERENTIAL/PLATELET
Basophils Absolute: 0 10*3/uL (ref 0.0–0.2)
Basos: 1 %
EOS (ABSOLUTE): 0.1 10*3/uL (ref 0.0–0.4)
Eos: 2 %
Hematocrit: 38.8 % (ref 34.0–46.6)
Hemoglobin: 12.3 g/dL (ref 11.1–15.9)
Immature Grans (Abs): 0 10*3/uL (ref 0.0–0.1)
Immature Granulocytes: 0 %
Lymphocytes Absolute: 2.9 10*3/uL (ref 0.7–3.1)
Lymphs: 38 %
MCH: 24.7 pg — ABNORMAL LOW (ref 26.6–33.0)
MCHC: 31.7 g/dL (ref 31.5–35.7)
MCV: 78 fL — ABNORMAL LOW (ref 79–97)
Monocytes Absolute: 0.6 10*3/uL (ref 0.1–0.9)
Monocytes: 8 %
Neutrophils Absolute: 4.1 10*3/uL (ref 1.4–7.0)
Neutrophils: 51 %
Platelets: 344 10*3/uL (ref 150–450)
RBC: 4.97 x10E6/uL (ref 3.77–5.28)
RDW: 16.1 % — ABNORMAL HIGH (ref 11.7–15.4)
WBC: 7.8 10*3/uL (ref 3.4–10.8)

## 2022-03-01 LAB — BMP8+EGFR
BUN/Creatinine Ratio: 10 (ref 9–23)
BUN: 7 mg/dL (ref 6–24)
CO2: 26 mmol/L (ref 20–29)
Calcium: 9.7 mg/dL (ref 8.7–10.2)
Chloride: 102 mmol/L (ref 96–106)
Creatinine, Ser: 0.69 mg/dL (ref 0.57–1.00)
Glucose: 115 mg/dL — ABNORMAL HIGH (ref 70–99)
Potassium: 4.2 mmol/L (ref 3.5–5.2)
Sodium: 141 mmol/L (ref 134–144)
eGFR: 111 mL/min/{1.73_m2} (ref >59)

## 2022-03-01 LAB — HEMOGLOBIN A1C
Est. average glucose Bld gHb Est-mCnc: 151 mg/dL
Hgb A1c MFr Bld: 6.9 % — ABNORMAL HIGH (ref 4.8–5.6)

## 2022-03-02 LAB — URINE CULTURE

## 2022-03-02 NOTE — Progress Notes (Signed)
Let pt know urine show no infection but still with blood  obtain CT renal study as requested

## 2022-03-03 ENCOUNTER — Telehealth: Payer: Self-pay

## 2022-03-03 ENCOUNTER — Ambulatory Visit
Admission: RE | Admit: 2022-03-03 | Discharge: 2022-03-03 | Disposition: A | Discharge status: Home / Self Care | Payer: No Typology Code available for payment source | Source: Ambulatory Visit | Attending: Critical Care Medicine | Admitting: Critical Care Medicine

## 2022-03-03 ENCOUNTER — Other Ambulatory Visit: Payer: Commercial Managed Care - HMO

## 2022-03-03 DIAGNOSIS — R1030 Lower abdominal pain, unspecified: Secondary | ICD-10-CM

## 2022-03-03 NOTE — Telephone Encounter (Signed)
Pt was called and is aware of results, DOB was confirmed.   Ct appointment is today 9/15

## 2022-03-03 NOTE — Telephone Encounter (Signed)
-----   Message from Storm Frisk, MD sent at 03/02/2022  6:17 AM EDT ----- Let pt know urine show no infection but still with blood  obtain CT renal study as requested

## 2022-03-05 NOTE — Progress Notes (Signed)
Let pt know CT was negative. No kidney stone or other pathology

## 2022-03-06 ENCOUNTER — Telehealth: Payer: Self-pay

## 2022-03-06 NOTE — Telephone Encounter (Signed)
Pt was called and is aware of results, DOB was confirmed.  ?

## 2022-03-06 NOTE — Telephone Encounter (Signed)
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 Would need a f/u appt with either me or go to [facility] or see angela mcclung

## 2022-03-06 NOTE — Telephone Encounter (Signed)
-----   Message from Elsie Stain, MD sent at 03/05/2022  4:45 PM EDT ----- Let pt know CT was negative. No kidney stone or other pathology

## 2022-03-07 ENCOUNTER — Telehealth: Payer: Self-pay

## 2022-03-07 NOTE — Telephone Encounter (Signed)
Pt was called and is aware of results, DOB was confirmed.  ?

## 2022-03-07 NOTE — Telephone Encounter (Signed)
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 See if can be worked in [employer] or [place]

## 2022-03-07 NOTE — Telephone Encounter (Signed)
Noted patient is aware and has appointment next week

## 2022-03-16 ENCOUNTER — Ambulatory Visit
Admission: RE | Admit: 2022-03-16 | Discharge: 2022-03-16 | Disposition: A | Discharge status: Home / Self Care | Payer: Commercial Managed Care - HMO | Source: Ambulatory Visit | Attending: Critical Care Medicine | Admitting: Critical Care Medicine

## 2022-03-16 ENCOUNTER — Encounter: Payer: Self-pay | Admitting: Critical Care Medicine

## 2022-03-16 ENCOUNTER — Ambulatory Visit: Payer: Commercial Managed Care - HMO | Attending: Critical Care Medicine | Admitting: Critical Care Medicine

## 2022-03-16 VITALS — BP 114/72 | HR 85 | Temp 98.0°F | Ht 65.0 in | Wt 224.2 lb

## 2022-03-16 DIAGNOSIS — H6982 Other specified disorders of Eustachian tube, left ear: Secondary | ICD-10-CM | POA: Diagnosis not present

## 2022-03-16 DIAGNOSIS — T7840XD Allergy, unspecified, subsequent encounter: Secondary | ICD-10-CM

## 2022-03-16 DIAGNOSIS — R311 Benign essential microscopic hematuria: Secondary | ICD-10-CM

## 2022-03-16 DIAGNOSIS — K1120 Sialoadenitis, unspecified: Secondary | ICD-10-CM | POA: Diagnosis not present

## 2022-03-16 DIAGNOSIS — M545 Low back pain, unspecified: Secondary | ICD-10-CM

## 2022-03-16 DIAGNOSIS — E1165 Type 2 diabetes mellitus with hyperglycemia: Secondary | ICD-10-CM

## 2022-03-16 DIAGNOSIS — Z6837 Body mass index (BMI) 37.0-37.9, adult: Secondary | ICD-10-CM

## 2022-03-16 MED ORDER — VITAMIN D (ERGOCALCIFEROL) 1.25 MG (50000 UNIT) PO CAPS: 50000.0000 [IU] | ORAL_CAPSULE | ORAL | 3 refills | Status: DC

## 2022-03-16 MED ORDER — CETIRIZINE HCL 10 MG PO TABS: 10.0000 mg | ORAL_TABLET | Freq: Every day | ORAL | 0 refills | Status: DC | PRN

## 2022-03-16 MED ORDER — FLUTICASONE PROPIONATE 50 MCG/ACT NA SUSP: 2.0000 | Freq: Every day | NASAL | 6 refills | Status: DC

## 2022-03-16 MED ORDER — CYCLOBENZAPRINE HCL 5 MG PO TABS: 5.0000 mg | ORAL_TABLET | Freq: Three times a day (TID) | ORAL | 1 refills | Status: DC | PRN

## 2022-03-16 MED ORDER — METFORMIN HCL 500 MG PO TABS: ORAL_TABLET | ORAL | 3 refills | Status: DC

## 2022-03-16 NOTE — Assessment & Plan Note (Signed)
Plan to give x-rays for further evaluation refer to physical therapy and give muscle relaxant

## 2022-03-16 NOTE — Assessment & Plan Note (Signed)

## 2022-03-16 NOTE — Progress Notes (Signed)
Established Patient Office Visit  Subjective:  Patient ID: Victoria Schneider, female    DOB: 1980-10-15  Age: 41 y.o. MRN: [id number]    History of Present Illness:   CC:  Flank pain  HPI 02/2021 Victoria Schneider presents for primary care follow-up visit last seen December 2021.  Patient has been noting periods of blood in the urine without dysuria.  She was seen previously this year in the emergency room for acute cystitis with hematuria.  Treated with a course of antibiotics for E. coli.  Patient does note excess menses with each menstrual period.  Patient does maintain metformin 1000 mg a.m. 500 mg p.m. for type 2 diabetes.  A1c had been controlled previously.  Blood sugar on arrival today 115 patient has also mild iron deficiency anemia from previous lab testing for which she is on iron supplementation. Patient does have mild vitamin D deficiency and is on vitamin D supplement.  Prior history of hepatic steatosis with elevated liver functions for which use of statin was held.  Patient still has significant obesity BMI of nearly 38.  Patient does state her bilateral wrist pain has been improved suspected to be due to carpal tunnel syndrome and doing well with wrist splints at night.  Patient also has hypersomnia with excess snoring.  She was not able to follow through with a referral to sleep medicine.  12/01/21 patient did not need interpreter This is a return visit from prior visit in September 2022.  In the interim the patient was seen in May for a bout of sinusitis from which she has resolved.  She does still have some ear popping and was given Zyrtec-D and Flonase by the PA.  Patient also had a visit in March was found to have hematuria and iron deficiency anemia referred to urology she has never been able to complete the appointment as she did not receive a phone call but they state they tried to call with no answer.  She is also had low back pain and neck pain that is gotten  worse and her weight is up to 215 pounds.  Note in March with her diabetes she was screened again has an A1c of 6.2 and is on the metformin at 1000 mg daily.  She had low vitamin D levels and was recommended to take vitamin D but she is not actually on any supplements at this time.  Today she needs a urine microalbumin assessed she needs a Pap smear foot exam and needs to get her eye exam performed  Patient still notes some blood in the urine and has occasional vaginal discharge.  There are no other complaints.  She is due a pneumonia vaccine she agrees to receive at this visit.   Below is assessment from the PA visit in March [place] 08/2021   1. Type 2 diabetes mellitus with hyperglycemia, without long-term current use of insulin (HCC) Controlled on the dose she is actually taking which I will send prescription she is currently taking it now - Glucose (CBG) - HgB A1c - POCT URINALYSIS DIP (CLINITEK) - Comprehensive metabolic panel - metFORMIN (GLUCOPHAGE) 500 MG tablet; 2 tabs daily with dinner  Dispense: 60 tablet; Refill: 3   2. Other microscopic hematuria No infection-increase water intake to 80-100 ounces water daily - POCT URINALYSIS DIP (CLINITEK) - Ambulatory referral to Urology   3. Dysfunction of both eustachian tubes Flonase sent - fluticasone (FLONASE) 50 MCG/ACT nasal spray; PLACE 2 SPRAYS INTO BOTH NOSTRILS DAILY.  Dispense: 16 g; Refill: 6   4. Iron deficiency anemia due to chronic blood loss - ferrous sulfate 325 (65 FE) MG tablet; Take 1 tablet (325 mg total) by mouth daily.  Dispense: 100 tablet; Refill: 1 - CBC with Differential/Platelet   6. Allergy, subsequent encounter - cetirizine-pseudoephedrine (ZYRTEC-D) 5-120 MG tablet; Take 1 tablet by mouth 2 (two) times daily. prn  Dispense: 60 tablet; Refill: 1 - fluticasone (FLONASE) 50 MCG/ACT nasal spray; PLACE 2 SPRAYS INTO BOTH NOSTRILS DAILY.  Dispense: 16 g; Refill: 6   7. Vitamin D deficiency - Vitamin D,  25-hydroxy   8. Chronic right-sided low back pain without sciatica - ibuprofen (ADVIL) 800 MG tablet; Take 1 tablet (800 mg total) by mouth 3 (three) times daily. Prn pain  Dispense: 30 tablet; Refill: 0 - methocarbamol (ROBAXIN) 500 MG tablet; Take 2 tablets (1,000 mg total) by mouth every 8 (eight) hours as needed for muscle spasms.  Dispense: 90 tablet; Refill: 0       The patient never received her Pap smear never got the visit with gynecology.  9/11 This patient is seen in return follow-up by way of a phone visit she has had continued flank pain bilaterally and was seen previously this year for blood in the urine by urology in [place].  Cystoscopy was done was normal repeat urinalysis showed blood in the urine but urine cultures were negative she had a visit here in our clinic recently had urine cultures done which showed multiple organisms which suggested repeat collection.  She is yet to have a CT renal study but she did have a renal ultrasound that was unremarkable did not show hydronephrosis.  Patient was not able to come to the office today and instead was set up for a virtual visit.  She still having flank pain.  She has been to urgent care more recently.  She was given Bactrim and this helped to some degree but her symptoms have recurred.  9/28 Patient returns from the last visit that was a phone visit and September of this month.  She still having low back pain but it is less.  She denies any blood in the urine at this time.  She is yet to achieve the lumbosacral spine x-ray. Urinalysis previously has shown some hematuria.  She had a CT renal study which was negative for stones or abnormalities in the kidneys.  She continues to eat a lot of rice tortillas in her diet.  She works Education administrator houses.  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  No other complaints.  Past Medical History:  Diagnosis Date   Acute cystitis with hematuria 03/10/2021   Elevated LFTs 06/09/2020   GERD  (gastroesophageal reflux disease)    Type 2 diabetes mellitus with hyperglycemia, without long-term current use of insulin ([place]) 05/05/2020    Past Surgical History:  Procedure Laterality Date   NO PAST SURGERIES      Family History  Problem Relation Age of Onset   Diabetes Father    Hypertension Father    Diabetes Brother    Hypertension Mother    Depression Daughter     Social History   Socioeconomic History   Marital status: Single    Spouse name: Not on file   Number of children: Not on file   Years of education: Not on file   Highest education level: Not on file  Occupational History   Not on file  Tobacco Use   Smoking status: Never  Smokeless tobacco: Never  Vaping Use   Vaping Use: Never used  Substance and Sexual Activity   Alcohol use: Not Currently    Comment: rarely   Drug use: No   Sexual activity: Yes    Birth control/protection: None  Other Topics Concern   Not on file  Social History Narrative   Not on file   Social Determinants of Health   Financial Resource Strain: Not on file  Food Insecurity: Not on file  Transportation Needs: Not on file  Physical Activity: Not on file  Stress: Not on file  Social Connections: Not on file  Intimate Partner Violence: Not on file    Outpatient Medications Prior to Visit  Medication Sig Dispense Refill   acetaminophen (TYLENOL) 325 MG tablet Take 650 mg by mouth every 6 (six) hours as needed.     Blood Glucose Monitoring Suppl (TRUE METRIX METER) w/Device KIT 1 each by Does not apply route in the morning and at bedtime. 1 kit 0   ferrous sulfate 325 (65 FE) MG tablet Take 1 tablet (325 mg total) by mouth daily. 100 tablet 1   glucose blood (TRUE METRIX BLOOD GLUCOSE TEST) test strip Use as instructed 100 each 12   ibuprofen (ADVIL) 600 MG tablet Take 1 tablet (600 mg total) by mouth every 6 (six) hours as needed. 30 tablet 0   TRUEplus Lancets 28G MISC USE AS DIRECTED IN THE MORNING AND AT BEDTIME 100  each 2   cetirizine (ZYRTEC) 10 MG tablet Take 1 tablet (10 mg total) by mouth daily as needed for allergies. 30 tablet 0   metFORMIN (GLUCOPHAGE) 500 MG tablet 2 tabs daily with dinner 60 tablet 3   Vitamin D, Ergocalciferol, (DRISDOL) 1.25 MG (50000 UNIT) CAPS capsule Take 1 capsule (50,000 Units total) by mouth every 7 (seven) days. 16 capsule 3   fluticasone (FLONASE) 50 MCG/ACT nasal spray PLACE 2 SPRAYS INTO BOTH NOSTRILS DAILY. (Patient not taking: Reported on 03/16/2022) 16 g 6   methocarbamol (ROBAXIN) 500 MG tablet Take 2 tablets (1,000 mg total) by mouth every 8 (eight) hours as needed for muscle spasms. (Patient not taking: Reported on 03/16/2022) 90 tablet 0   phenazopyridine (PYRIDIUM) 200 MG tablet Take 1 tablet (200 mg total) by mouth 3 (three) times daily as needed for pain. (Patient not taking: Reported on 03/16/2022) 6 tablet 0   No facility-administered medications prior to visit.    Allergies  Allergen Reactions   Penicillins Rash and Other (See Comments)    Has patient had a PCN reaction causing immediate rash, facial/tongue/throat swelling, SOB or lightheadedness with hypotension: No Has patient had a PCN reaction causing severe rash involving mucus membranes or skin necrosis: No Has patient had a PCN reaction that required hospitalization No Has patient had a PCN reaction occurring within the last 10 years: Yes If all of the above answers are "NO", then may proceed with Cephalosporin use.     ROS Review of Systems  Constitutional:  Negative for chills, diaphoresis and fever.  HENT:  Negative for congestion, hearing loss, nosebleeds, sore throat and tinnitus.   Eyes:  Negative for photophobia and redness.  Respiratory:  Negative for cough, shortness of breath, wheezing and stridor.   Cardiovascular:  Negative for chest pain, palpitations and leg swelling.  Gastrointestinal:  Negative for abdominal pain, blood in stool, constipation, diarrhea, nausea and vomiting.   Endocrine: Negative for polydipsia.  Genitourinary:  Positive for dysuria. Negative for flank pain, frequency, hematuria, menstrual  problem and urgency.  Musculoskeletal:  Positive for back pain. Negative for myalgias and neck pain.  Skin:  Negative for rash.  Allergic/Immunologic: Negative for environmental allergies.  Neurological:  Negative for dizziness, tremors, seizures, weakness and headaches.  Hematological:  Does not bruise/bleed easily.  Psychiatric/Behavioral:  Negative for suicidal ideas. The patient is not nervous/anxious.       Objective:      BP 114/72 (BP Location: Left Arm, Patient Position: Sitting, Cuff Size: Normal)   Pulse 85   Temp 98 F (36.7 C) (Oral)   Ht 5' 5"  (1.651 m)   Wt 224 lb 3.2 oz (101.7 kg)   LMP 02/02/2022   BMI 37.31 kg/m  Wt Readings from Last 3 Encounters:  03/16/22 224 lb 3.2 oz (101.7 kg)  02/09/22 222 lb (100.7 kg)  01/23/22 220 lb 9.6 oz (100.1 kg)   Vitals:   03/16/22 0933  BP: 114/72  Pulse: 85  Temp: 98 F (36.7 C)  TempSrc: Oral  Weight: 224 lb 3.2 oz (101.7 kg)  Height: 5' 5"  (1.651 m)    Gen: Pleasant, well-nourished, in no distress,  normal affect  ENT: No lesions,  mouth clear,  oropharynx clear, no postnasal drip  Neck: No JVD, no TMG, no carotid bruits  Lungs: No use of accessory muscles, no dullness to percussion, clear without rales or rhonchi  Cardiovascular: RRR, heart sounds normal, no murmur or gallops, no peripheral edema  Abdomen: soft and NT, no HSM,  BS normal  Musculoskeletal: No deformities, no cyanosis or clubbing, muscles and lumbar spine paraspinal are spastic and tender  Neuro: alert, non focal  Skin: Warm, no lesions or rashes  No results found.   Health Maintenance Due  Topic Date Due   OPHTHALMOLOGY EXAM  Never done    There are no preventive care reminders to display for this patient.  Lab Results  Component Value Date   TSH 2.670 10/14/2020   Lab Results  Component  Value Date   WBC 7.8 02/28/2022   HGB 12.3 02/28/2022   HCT 38.8 02/28/2022   MCV 78 (L) 02/28/2022   PLT 344 02/28/2022   Lab Results  Component Value Date   NA 141 02/28/2022   K 4.2 02/28/2022   CO2 26 02/28/2022   GLUCOSE 115 (H) 02/28/2022   BUN 7 02/28/2022   CREATININE 0.69 02/28/2022   BILITOT 0.6 08/25/2021   ALKPHOS 141 (H) 08/25/2021   AST 22 08/25/2021   ALT 26 08/25/2021   PROT 7.4 08/25/2021   ALBUMIN 4.3 08/25/2021   CALCIUM 9.7 02/28/2022   ANIONGAP 9 07/22/2017   EGFR 111 02/28/2022   Lab Results  Component Value Date   CHOL 175 05/26/2020   Lab Results  Component Value Date   HDL 56 05/26/2020   Lab Results  Component Value Date   LDLCALC 102 (H) 05/26/2020   Lab Results  Component Value Date   TRIG 93 05/26/2020   Lab Results  Component Value Date   CHOLHDL 3.1 05/26/2020   Lab Results  Component Value Date   HGBA1C 6.9 (H) 02/28/2022      Assessment & Plan:   Problem List Items Addressed This Visit    I personally reviewed all images and lab data in the [facility] system as well as any outside material available during this office visit and agree with the  radiology impressions.   Hematuria Would benefit from repeat urinalysis urine culture and referral to urology may benefit from a cystoscopy  does have insurance  Type 2 diabetes mellitus with hyperglycemia, without long-term current use of insulin [facility]) Patient yet again given a lifestyle medicine handout The following Lifestyle Medicine recommendations according to [place] [facility]) were discussed and offered to patient who agrees to start the journey:  A. Whole Foods, Plant-based plate comprising of fruits and vegetables, plant-based proteins, whole-grain carbohydrates was discussed in detail with the patient.   A list for source of those nutrients were also provided to the patient.  Patient will use only water or unsweetened tea for hydration. B.  The need to  stay away from risky substances including alcohol, smoking; obtaining 7 to 9 hours of restorative sleep, at least 150 minutes of moderate intensity exercise weekly, the importance of healthy social connections,  and stress reduction techniques were discussed. C.  A full color page of  Calorie density of various food groups per pound showing examples of each food groups was provided to the patient.  Continue metformin for now   Lumbar back pain Plan to give x-rays for further evaluation refer to physical therapy and give muscle relaxant  BMI 37.0-37.9, adult The following Lifestyle Medicine recommendations according to [facility]) were discussed and offered to patient who agrees to start the journey:  A. Whole Foods, Plant-based plate comprising of fruits and vegetables, plant-based proteins, whole-grain carbohydrates was discussed in detail with the patient.   A list for source of those nutrients were also provided to the patient.  Patient will use only water or unsweetened tea for hydration. B.  The need to stay away from risky substances including alcohol, smoking; obtaining 7 to 9 hours of restorative sleep, at least 150 minutes of moderate intensity exercise weekly, the importance of healthy social connections,  and stress reduction techniques were discussed. C.  A full color page of  Calorie density of various food groups per pound showing examples of each food groups was provided to the patient.  Kariah was seen today for arthritis.  Diagnoses and all orders for this visit:  Type 2 diabetes mellitus with hyperglycemia, without long-term current use of insulin (HCC) -     metFORMIN (GLUCOPHAGE) 500 MG tablet; 2 tabs daily with dinner  Sialoadenitis of submandibular gland -     cetirizine (ZYRTEC) 10 MG tablet; Take 1 tablet (10 mg total) by mouth daily as needed for allergies.  Eustachian tube dysfunction, left -     fluticasone (FLONASE) 50 MCG/ACT  nasal spray; PLACE 2 SPRAYS INTO BOTH NOSTRILS DAILY.  Allergy, subsequent encounter -     fluticasone (FLONASE) 50 MCG/ACT nasal spray; PLACE 2 SPRAYS INTO BOTH NOSTRILS DAILY.  Benign essential microscopic hematuria -     Urinalysis -     Urine Culture -     Ambulatory referral to Urology  Lumbar back pain -     Ambulatory referral to Physical Therapy -     DG Lumbar Spine Complete; Future  BMI 37.0-37.9, adult  Other orders -     Vitamin D, Ergocalciferol, (DRISDOL) 1.25 MG (50000 UNIT) CAPS capsule; Take 1 capsule (50,000 Units total) by mouth every 7 (seven) days. -     cyclobenzaprine (FLEXERIL) 5 MG tablet; Take 1 tablet (5 mg total) by mouth 3 (three) times daily as needed for muscle spasms.  38 minutes spent extra time needed for patient education Encouraged to get an eye exam Plan follow-up in 3 months   Asencion Noble, MD

## 2022-03-16 NOTE — Assessment & Plan Note (Signed)
Would benefit from repeat urinalysis urine culture and referral to urology may benefit from a cystoscopy does have insurance

## 2022-03-16 NOTE — Patient Instructions (Signed)
Follow recommendations on the lifestyle medicine handout we gave you with regards to diet  Please get an eye exam this calendar year with your diabetes you can go to the [facility] for the most affordable eye exam let them know you have diabetes  Start cyclobenzaprine 3 times daily as needed for back spasms and physical therapy referral was made  Urine sample again obtained today for culture and analysis and we will refer you to urology  Physical therapy referral was made for your back pain and we will obtain lower back x-rays today downstairs on your way out in the x-ray department  Return to see Dr. Joya Gaskins 3 months  Refills on all medications sent to your pharmacy

## 2022-03-16 NOTE — Assessment & Plan Note (Signed)
Patient yet again given a lifestyle medicine handout The following Lifestyle Medicine recommendations according to [place] of Lifestyle Medicine [facility]) were discussed and offered to patient who agrees to start the journey:  A. Whole Foods, Plant-based plate comprising of fruits and vegetables, plant-based proteins, whole-grain carbohydrates was discussed in detail with the patient.   A list for source of those nutrients were also provided to the patient.  Patient will use only water or unsweetened tea for hydration. B.  The need to stay away from risky substances including alcohol, smoking; obtaining 7 to 9 hours of restorative sleep, at least 150 minutes of moderate intensity exercise weekly, the importance of healthy social connections,  and stress reduction techniques were discussed. C.  A full color page of  Calorie density of various food groups per pound showing examples of each food groups was provided to the patient.  Continue metformin for now

## 2022-03-17 ENCOUNTER — Telehealth: Payer: Self-pay

## 2022-03-17 LAB — URINALYSIS
Bilirubin, UA: NEGATIVE
Glucose, UA: NEGATIVE
Ketones, UA: NEGATIVE
Leukocytes,UA: NEGATIVE
Nitrite, UA: NEGATIVE
Specific Gravity, UA: 1.025 (ref 1.005–1.030)
Urobilinogen, Ur: 0.2 mg/dL (ref 0.2–1.0)
pH, UA: 6.5 (ref 5.0–7.5)

## 2022-03-17 NOTE — Telephone Encounter (Signed)
-----   Message from Elsie Stain, MD sent at 03/17/2022  6:39 AM EDT ----- Let pt know still has blood in urine keep urology appt when made

## 2022-03-17 NOTE — Telephone Encounter (Signed)
Pt was called and is aware of results, DOB was confirmed.  ?

## 2022-03-17 NOTE — Progress Notes (Signed)
Let pt know still has blood in urine keep urology appt when made

## 2022-03-18 LAB — URINE CULTURE

## 2022-03-19 NOTE — Progress Notes (Signed)
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

## 2022-03-20 ENCOUNTER — Telehealth: Payer: Self-pay

## 2022-03-20 NOTE — Telephone Encounter (Signed)
Pt was called and is aware of results, DOB was confirmed.  ?

## 2022-03-20 NOTE — Telephone Encounter (Signed)
-----   Message from Elsie Stain, MD sent at 03/18/2022 10:24 AM EDT ----- Let pt know urine is negative for UTI

## 2022-03-20 NOTE — Telephone Encounter (Signed)
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

## 2022-03-21 NOTE — Therapy (Addendum)
OUTPATIENT PHYSICAL THERAPY THORACOLUMBAR EVALUATION/DC SUMMARY   Patient Name: Victoria Schneider MRN: [id number] DOB:[birth year], 41 y.o., female Today's Date: 03/22/2022 PHYSICAL THERAPY DISCHARGE SUMMARY  Visits from Start of Care: 1  Current functional level related to goals / functional outcomes: UTA   Remaining deficits: UTA   Education / Equipment: HEP   Patient agrees to discharge. Patient goals were  UTA . Patient is being discharged due to not returning since the last visit.   PT End of Session - 03/22/22 1505     Visit Number 1    Number of Visits 8    Date for PT Re-Evaluation 05/17/22    Authorization Type [employer]    PT Start Time 1315    PT Stop Time 1400    PT Time Calculation (min) 45 min    Activity Tolerance Patient tolerated treatment well    Behavior During Therapy WFL for tasks assessed/performed             Past Medical History:  Diagnosis Date   Acute cystitis with hematuria 03/10/2021   Elevated LFTs 06/09/2020   GERD (gastroesophageal reflux disease)    Type 2 diabetes mellitus with hyperglycemia, without long-term current use of insulin (HCC) 05/05/2020   Past Surgical History:  Procedure Laterality Date   NO PAST SURGERIES     Patient Active Problem List   Diagnosis Date Noted   Lumbar back pain 03/16/2022   Hematuria 12/01/2021   Cervical cancer screening 03/10/2021   Menorrhagia with regular cycle 03/10/2021   Iron deficiency anemia due to chronic blood loss 03/10/2021   BMI 37.0-37.9, adult 03/10/2021   Vitamin D deficiency 03/10/2021   Hypersomnia 06/09/2020   Type 2 diabetes mellitus with hyperglycemia, without long-term current use of insulin (HCC) 05/05/2020    PCP: Storm Frisk, MD  REFERRING PROVIDER: Storm Frisk, MD  REFERRING DIAG: M54.50 (ICD-10-CM) - Lumbar back pain   Rationale for Evaluation and Treatment Rehabilitation  THERAPY DIAG: Lumbar back pain   ONSET DATE: several months    SUBJECTIVE:                                                                                                                                                                                           SUBJECTIVE STATEMENT: Describes a history of low back pain, worse upon waking and with weather changes, denies radicular symptoms, muscle relaxers do not help, 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  She  still having low back pain but it is less.  She denies any blood in the urine at this time.  She is yet to achieve the lumbosacral spine x-ray. Urinalysis previously has shown some hematuria.  She had a CT renal study which was negative for stones or abnormalities in the kidneys.  She continues to eat a lot of rice tortillas in her diet.  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  No other complaints.  PAIN:  Are you having pain? Yes: NPRS scale: 8/10 Pain location: low back Pain description: ache Aggravating factors: sleeping, weather changes   Relieving factors: position changes, OTC meds    PRECAUTIONS: None  WEIGHT BEARING RESTRICTIONS No  FALLS:  Has patient fallen in last 6 months? No  LIVING ENVIRONMENT: Lives with: lives with their family  OCCUPATION: housekeeper  PLOF: Independent  PATIENT GOALS To reduce and manage my back pain   OBJECTIVE:   DIAGNOSTIC FINDINGS:  CLINICAL DATA:  Low back pain for 2-3 months.  No injury.   EXAM: LUMBAR SPINE - COMPLETE 4+ VIEW   COMPARISON:  08/27/2016.   FINDINGS: No fracture, bone lesion or spondylolisthesis.   Disc spaces preserved. Minor anterior endplate osteophytes, L1 through L5.   Facet degenerative changes bilaterally at L5-S1.   Soft tissues are unremarkable.   IMPRESSION: 1. No fracture or acute finding. 2. Minimal disc degenerative change.  Mild bilateral facet degenerative change at L5-S1.     Electronically Signed   By: Amie Portland M.D.   On: 03/17/2022 14:07  PATIENT SURVEYS:  FOTO 67(77 predicted)  SCREENING FOR RED FLAGS:  negative  COGNITION:  Overall cognitive status: Within functional limits for tasks assessed     SENSATION: Not tested  MUSCLE LENGTH: Hamstrings: Right 80 deg; Left 80 deg Thomas test: positive for low back pain B  POSTURE: No Significant postural limitations  PALPATION: Increased myofascial restrictions throughout lumbosacral region  LUMBAR ROM:   Active  A/PROM  eval  Flexion 50%  Extension 50%  Right lateral flexion 75%  Left lateral flexion 50%  Right rotation   Left rotation    (Blank rows = not tested)  LOWER EXTREMITY ROM:     Active  Right eval Left eval  Hip flexion 120 120  Hip extension 10 10  Hip abduction    Hip adduction    Hip internal rotation    Hip external rotation    Knee flexion    Knee extension    Ankle dorsiflexion    Ankle plantarflexion    Ankle inversion    Ankle eversion     (Blank rows = not tested)  LOWER EXTREMITY MMT:    MMT Right eval Left eval  Hip flexion 4 4  Hip extension 4 4  Hip abduction 4 4  Hip adduction    Hip internal rotation    Hip external rotation    Knee flexion 4 4  Knee extension 4 4  Ankle dorsiflexion    Ankle plantarflexion    Ankle inversion    Ankle eversion    core 3 3     LUMBAR SPECIAL TESTS:  Straight leg raise test: Negative, Slump test: Negative, FABER test: Positive, and Thomas test: Positive  FUNCTIONAL TESTS:  5 times sit to stand: 12s arms crossed  GAIT: Distance walked: 36ft x2 Assistive device utilized: None Level of assistance: Complete Independence Comments: limited trunk rotation    TODAY'S TREATMENT  Eval and HEP  PATIENT EDUCATION:  Education details: Discussed eval findings, rehab rationale and POC and patient is in agreement  Person educated:  Patient Education method: Explanation Education comprehension: verbalized understanding and needs further education   HOME EXERCISE PROGRAM: Access Code: [email or web address] URL: [email or web address] Date: 03/22/2022 Prepared by: Sharlynn Oliphant  Exercises - Prone Press Up On Elbows  - 2 x daily - 5 x weekly - 1 sets - 1 reps - 60s hold - Hooklying Single Knee to Chest Stretch  - 2 x daily - 5 x weekly - 1 sets - 3 reps - 30s hold - Modified Thomas Stretch  - 2 x daily - 5 x weekly - 1 sets - 3 reps - 30s hold - Curl Up with Arms Crossed  - 2 x daily - 5 x weekly - 1 sets - 10 reps  ASSESSMENT:  CLINICAL IMPRESSION: Patient is a 41 y.o. female who was seen today for physical therapy evaluation and treatment for low back pain w/o radicular symptoms. AROM limited by soft tissue and myofascial restrictions in lumbosacral region, hip flexors as limited intersegmental restrictions limting extension   OBJECTIVE IMPAIRMENTS decreased knowledge of condition, decreased strength, impaired flexibility, postural dysfunction, and pain.   ACTIVITY LIMITATIONS carrying, lifting, sleeping, and bed mobility  PERSONAL FACTORS Fitness and Time since onset of injury/illness/exacerbation are also affecting patient's functional outcome.   REHAB POTENTIAL: Good  CLINICAL DECISION MAKING: Stable/uncomplicated  EVALUATION COMPLEXITY: Low   GOALS: Goals reviewed with patient? No  SHORT TERM GOALS: Target date: 04/05/2022  Patient to demonstrate independence in HEP  Baseline:[id number] Goal status: INITIAL  2.  Decrease pain to 6/10 at worst Baseline: 8/10 at worst Goal status: INITIAL   LONG TERM GOALS: Target date: 04/19/2022  Increase AROM of trunk to 75% Baseline:  Active  A/PROM  eval  Flexion 50%  Extension 50%  Right lateral flexion 75%  Left lateral flexion 50%   Goal status: INITIAL  2.  Increase FOTO score to 77 Baseline: 67 Goal status: INITIAL  3.  Increase core  strength to 3+/5 Baseline: 3/5 Goal status: INITIAL    PLAN: PT FREQUENCY: 2x/week  PT DURATION: 4 weeks  PLANNED INTERVENTIONS: Therapeutic exercises, Therapeutic activity, Neuromuscular re-education, Balance training, Gait training, Patient/Family education, Self Care, Joint mobilization, Stair training, Dry Needling, Electrical stimulation, Spinal mobilization, Manual therapy, and Re-evaluation.  PLAN FOR NEXT SESSION: HEP review and update, core strengthening, flexibility and ROM tasks, body mechanics training   Lanice Shirts, PT 03/22/2022, 3:16 PM

## 2022-03-22 ENCOUNTER — Ambulatory Visit: Payer: Commercial Managed Care - HMO | Attending: Critical Care Medicine

## 2022-03-22 DIAGNOSIS — S39012A Strain of muscle, fascia and tendon of lower back, initial encounter: Secondary | ICD-10-CM | POA: Insufficient documentation

## 2022-03-22 DIAGNOSIS — M545 Low back pain, unspecified: Secondary | ICD-10-CM | POA: Insufficient documentation

## 2022-03-22 DIAGNOSIS — M6281 Muscle weakness (generalized): Secondary | ICD-10-CM | POA: Insufficient documentation

## 2022-03-22 DIAGNOSIS — M5459 Other low back pain: Secondary | ICD-10-CM | POA: Insufficient documentation

## 2022-03-28 ENCOUNTER — Ambulatory Visit
Admission: EM | Admit: 2022-03-28 | Discharge: 2022-03-28 | Disposition: A | Discharge status: Home / Self Care | Payer: Commercial Managed Care - HMO | Attending: Physician Assistant | Admitting: Physician Assistant

## 2022-03-28 DIAGNOSIS — J019 Acute sinusitis, unspecified: Secondary | ICD-10-CM

## 2022-03-28 MED ORDER — DOXYCYCLINE HYCLATE 100 MG PO CAPS: 100.0000 mg | ORAL_CAPSULE | Freq: Two times a day (BID) | ORAL | 0 refills | Status: DC

## 2022-03-28 NOTE — ED Triage Notes (Signed)
Pt presents to [facility] with co of sore throat cough stuffy nose, ha and otalgia for one week otc medications not helping.

## 2022-03-28 NOTE — ED Provider Notes (Signed)
[name]    CSN: [id number] Arrival date & time: 03/28/22  1102      History   Chief Complaint Chief Complaint  Patient presents with   Otalgia   Sore Throat   Headache    HPI Victoria Schneider is a 41 y.o. female.   Patient here today for evaluation of sore throat, cough, stuffy nose, headache that started a week ago and seems to be worsening. She has not had fever. She has not had any vomiting or diarrhea. She has tried multiple OTC meds without resolution of symptoms.   The history is provided by the patient.  Otalgia Associated symptoms: congestion, cough, headaches and sore throat   Associated symptoms: no diarrhea, no fever and no vomiting   Sore Throat Associated symptoms include headaches. Pertinent negatives include no shortness of breath.  Headache Associated symptoms: congestion, cough, ear pain, sinus pressure and sore throat   Associated symptoms: no diarrhea, no fever, no nausea and no vomiting     Past Medical History:  Diagnosis Date   Acute cystitis with hematuria 03/10/2021   Elevated LFTs 06/09/2020   GERD (gastroesophageal reflux disease)    Type 2 diabetes mellitus with hyperglycemia, without long-term current use of insulin ([place]) 05/05/2020    Patient Active Problem List   Diagnosis Date Noted   Lumbar back pain 03/16/2022   Hematuria 12/01/2021   Cervical cancer screening 03/10/2021   Menorrhagia with regular cycle 03/10/2021   Iron deficiency anemia due to chronic blood loss 03/10/2021   BMI 37.0-37.9, adult 03/10/2021   Vitamin D deficiency 03/10/2021   Hypersomnia 06/09/2020   Type 2 diabetes mellitus with hyperglycemia, without long-term current use of insulin ([place]) 05/05/2020    Past Surgical History:  Procedure Laterality Date   NO PAST SURGERIES      OB History     Gravida  4   Para  4   Term  4   Preterm      AB      Living  4      SAB      IAB      Ectopic      Multiple  0   Live  Births  4            Home Medications    Prior to Admission medications   Medication Sig Start Date End Date Taking? Authorizing Provider  doxycycline (VIBRAMYCIN) 100 MG capsule Take 1 capsule (100 mg total) by mouth 2 (two) times daily. 03/28/22  Yes Francene Finders, PA-C  acetaminophen (TYLENOL) 325 MG tablet Take 650 mg by mouth every 6 (six) hours as needed.    [provider]  Blood Glucose Monitoring Suppl (TRUE METRIX METER) w/Device KIT 1 each by Does not apply route in the morning and at bedtime. 05/05/20   Argentina Donovan, PA-C  cetirizine (ZYRTEC) 10 MG tablet Take 1 tablet (10 mg total) by mouth daily as needed for allergies. 03/16/22   Elsie Stain, MD  cyclobenzaprine (FLEXERIL) 5 MG tablet Take 1 tablet (5 mg total) by mouth 3 (three) times daily as needed for muscle spasms. 03/16/22   Elsie Stain, MD  ferrous sulfate 325 (65 FE) MG tablet Take 1 tablet (325 mg total) by mouth daily. 12/01/21   Elsie Stain, MD  fluticasone (FLONASE) 50 MCG/ACT nasal spray PLACE 2 SPRAYS INTO BOTH NOSTRILS DAILY. 03/16/22 03/16/23  Elsie Stain, MD  glucose blood (TRUE METRIX BLOOD GLUCOSE  TEST) test strip Use as instructed 12/01/21   Elsie Stain, MD  ibuprofen (ADVIL) 600 MG tablet Take 1 tablet (600 mg total) by mouth every 6 (six) hours as needed. 02/05/22   Melynda Ripple, MD  metFORMIN (GLUCOPHAGE) 500 MG tablet 2 tabs daily with dinner 03/16/22   Elsie Stain, MD  TRUEplus Lancets 28G MISC USE AS DIRECTED IN THE MORNING AND AT BEDTIME 12/01/21 12/01/22  Elsie Stain, MD  Vitamin D, Ergocalciferol, (DRISDOL) 1.25 MG (50000 UNIT) CAPS capsule Take 1 capsule (50,000 Units total) by mouth every 7 (seven) days. 03/16/22   Elsie Stain, MD  albuterol (VENTOLIN HFA) 108 (90 Base) MCG/ACT inhaler Inhale 1-2 puffs into the lungs every 6 (six) hours as needed for wheezing or shortness of breath. Patient not taking: Reported on 08/06/2019 07/15/19  02/09/20  Hall-Potvin, [place], PA-C  famotidine (PEPCID) 20 MG tablet One after supper Patient not taking: Reported on 08/06/2019 01/23/19 02/09/20  Tanda Rockers, MD    Family History Family History  Problem Relation Age of Onset   Diabetes Father    Hypertension Father    Diabetes Brother    Hypertension Mother    Depression Daughter     Social History Social History   Tobacco Use   Smoking status: Never   Smokeless tobacco: Never  Vaping Use   Vaping Use: Never used  Substance Use Topics   Alcohol use: Not Currently    Comment: rarely   Drug use: No     Allergies   Penicillins   Review of Systems Review of Systems  Constitutional:  Negative for chills and fever.  HENT:  Positive for congestion, ear pain, sinus pressure and sore throat.   Eyes:  Negative for discharge and redness.  Respiratory:  Positive for cough. Negative for shortness of breath and wheezing.   Gastrointestinal:  Negative for diarrhea, nausea and vomiting.  Neurological:  Positive for headaches.     Physical Exam Triage Vital Signs ED Triage Vitals  Enc Vitals Group     BP 03/28/22 1121 118/83     Pulse Rate 03/28/22 1121 85     Resp 03/28/22 1121 20     Temp 03/28/22 1121 97.8 F (36.6 C)     Temp src --      SpO2 03/28/22 1121 98 %     Weight --      Height --      Head Circumference --      Peak Flow --      Pain Score 03/28/22 1120 7     Pain Loc --      Pain Edu? --      Excl. in [place]? --    No data found.  Updated Vital Signs BP 118/83   Pulse 85   Temp 97.8 F (36.6 C)   Resp 20   LMP 03/02/2022   SpO2 98%      Physical Exam Vitals and nursing note reviewed.  Constitutional:      General: She is not in acute distress.    Appearance: Normal appearance. She is not ill-appearing.  HENT:     Head: Normocephalic and atraumatic.     Right Ear: Tympanic membrane normal.     Left Ear: Tympanic membrane normal.     Nose: Congestion present.     Mouth/Throat:      Mouth: Mucous membranes are moist.     Pharynx: No oropharyngeal exudate or posterior oropharyngeal erythema.  Eyes:  Conjunctiva/sclera: Conjunctivae normal.  Cardiovascular:     Rate and Rhythm: Normal rate and regular rhythm.     Heart sounds: Normal heart sounds. No murmur heard. Pulmonary:     Effort: Pulmonary effort is normal. No respiratory distress.     Breath sounds: Normal breath sounds. No wheezing, rhonchi or rales.  Skin:    General: Skin is warm and dry.  Neurological:     Mental Status: She is alert.  Psychiatric:        Mood and Affect: Mood normal.        Thought Content: Thought content normal.      UC Treatments / Results  Labs (all labs ordered are listed, but only abnormal results are displayed) Labs Reviewed - No data to display  EKG   Radiology No results found.  Procedures Procedures (including critical care time)  Medications Ordered in UC Medications - No data to display  Initial Impression / Assessment and Plan / UC Course  I have reviewed the triage vital signs and the nursing notes.  Pertinent labs & imaging results that were available during my care of the patient were reviewed by me and considered in my medical decision making (see chart for details).    Suspect sinusitis- doxycycline prescribed for treatment of same. Encouraged follow up if no improvement or with any other concerns.   Final Clinical Impressions(s) / UC Diagnoses   Final diagnoses:  Acute sinusitis, recurrence not specified, unspecified location   Discharge Instructions   None    ED Prescriptions     Medication Sig Dispense Auth. Provider   doxycycline (VIBRAMYCIN) 100 MG capsule Take 1 capsule (100 mg total) by mouth 2 (two) times daily. 20 capsule Francene Finders, PA-C      PDMP not reviewed this encounter.   Francene Finders, PA-C 03/28/22 1218

## 2022-03-29 ENCOUNTER — Ambulatory Visit: Payer: Commercial Managed Care - HMO

## 2022-03-31 ENCOUNTER — Ambulatory Visit: Payer: Commercial Managed Care - HMO

## 2022-04-04 ENCOUNTER — Telehealth: Payer: Self-pay

## 2022-04-04 ENCOUNTER — Ambulatory Visit: Payer: Commercial Managed Care - HMO

## 2022-04-04 NOTE — Telephone Encounter (Signed)
TC due to missed visit, spoke directly to patient.  She reports she had cancelled today's session as she was not feeling well and spoke to someone in the front office.  Requested to cancel 10/19 visit as well and plans to return on 10/24 for next session.

## 2022-04-04 NOTE — Therapy (Deleted)
OUTPATIENT PHYSICAL THERAPY TREATMENT NOTE   Patient Name: Victoria Schneider MRN: [id number] DOB:08/18/1980, 41 y.o., female Today's Date: 04/04/2022  PCP: Elsie Stain, MD REFERRING PROVIDER: Elsie Stain, MD  END OF SESSION:    Past Medical History:  Diagnosis Date   Acute cystitis with hematuria 03/10/2021   Elevated LFTs 06/09/2020   GERD (gastroesophageal reflux disease)    Type 2 diabetes mellitus with hyperglycemia, without long-term current use of insulin ([place]) 05/05/2020   Past Surgical History:  Procedure Laterality Date   NO PAST SURGERIES     Patient Active Problem List   Diagnosis Date Noted   Lumbar back pain 03/16/2022   Hematuria 12/01/2021   Cervical cancer screening 03/10/2021   Menorrhagia with regular cycle 03/10/2021   Iron deficiency anemia due to chronic blood loss 03/10/2021   BMI 37.0-37.9, adult 03/10/2021   Vitamin D deficiency 03/10/2021   Hypersomnia 06/09/2020   Type 2 diabetes mellitus with hyperglycemia, without long-term current use of insulin ([place]) 05/05/2020    REFERRING DIAG: M54.50 (ICD-10-CM) - Lumbar back pain   THERAPY DIAG: Lumbar back pain   Rationale for Evaluation and Treatment Rehabilitation  PERTINENT HISTORY: 9/28 Patient returns from the last visit that was a phone visit and September of this month.  She still having low back pain but it is less.  She denies any blood in the urine at this time.  She is yet to achieve the lumbosacral spine x-ray. Urinalysis previously has shown some hematuria.  She had a CT renal study which was negative for stones or abnormalities in the kidneys.  She continues to eat a lot of rice tortillas in her diet.  She works Education administrator houses.  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  No other complaints.  PRECAUTIONS: None  SUBJECTIVE: ***  PAIN:  Are you having pain? {OPRCPAIN:[id number]}   OBJECTIVE: (objective measures completed at initial evaluation unless otherwise  dated)   DIAGNOSTIC FINDINGS:  CLINICAL DATA:  Low back pain for 2-3 months.  No injury.   EXAM: LUMBAR SPINE - COMPLETE 4+ VIEW   COMPARISON:  08/27/2016.   FINDINGS: No fracture, bone lesion or spondylolisthesis.   Disc spaces preserved. Minor anterior endplate osteophytes, L1 through L5.   Facet degenerative changes bilaterally at L5-S1.   Soft tissues are unremarkable.   IMPRESSION: 1. No fracture or acute finding. 2. Minimal disc degenerative change. Mild bilateral facet degenerative change at L5-S1.     Electronically Signed   By: Lajean Manes M.D.   On: 03/17/2022 14:07   PATIENT SURVEYS:  FOTO 67(77 predicted)   SCREENING FOR RED FLAGS:            negative   COGNITION:           Overall cognitive status: Within functional limits for tasks assessed                          SENSATION: Not tested   MUSCLE LENGTH: Hamstrings: Right 80 deg; Left 80 deg Thomas test: positive for low back pain B   POSTURE: No Significant postural limitations   PALPATION: Increased myofascial restrictions throughout lumbosacral region   LUMBAR ROM:    Active  A/PROM  eval  Flexion 50%  Extension 50%  Right lateral flexion 75%  Left lateral flexion 50%  Right rotation    Left rotation     (Blank rows = not tested)   LOWER EXTREMITY ROM:  Active  Right eval Left eval  Hip flexion 120 120  Hip extension 10 10  Hip abduction      Hip adduction      Hip internal rotation      Hip external rotation      Knee flexion      Knee extension      Ankle dorsiflexion      Ankle plantarflexion      Ankle inversion      Ankle eversion       (Blank rows = not tested)   LOWER EXTREMITY MMT:     MMT Right eval Left eval  Hip flexion 4 4  Hip extension 4 4  Hip abduction 4 4  Hip adduction      Hip internal rotation      Hip external rotation      Knee flexion 4 4  Knee extension 4 4  Ankle dorsiflexion      Ankle plantarflexion      Ankle inversion       Ankle eversion      core 3 3      LUMBAR SPECIAL TESTS:  Straight leg raise test: Negative, Slump test: Negative, FABER test: Positive, and Thomas test: Positive   FUNCTIONAL TESTS:  5 times sit to stand: 12s arms crossed   GAIT: Distance walked: 69ft x2 Assistive device utilized: None Level of assistance: Complete Independence Comments: limited trunk rotation       TODAY'S TREATMENT  Eval and HEP     PATIENT EDUCATION:  Education details: Discussed eval findings, rehab rationale and POC and patient is in agreement  Person educated: Patient Education method: Explanation Education comprehension: verbalized understanding and needs further education     HOME EXERCISE PROGRAM: Access Code: [email or web address] URL: [email or web address] Date: 03/22/2022 Prepared by: Gustavus Bryant   Exercises - Prone Press Up On Elbows  - 2 x daily - 5 x weekly - 1 sets - 1 reps - 60s hold - Hooklying Single Knee to Chest Stretch  - 2 x daily - 5 x weekly - 1 sets - 3 reps - 30s hold - Modified Thomas Stretch  - 2 x daily - 5 x weekly - 1 sets - 3 reps - 30s hold - Curl Up with Arms Crossed  - 2 x daily - 5 x weekly - 1 sets - 10 reps   ASSESSMENT:   CLINICAL IMPRESSION: Patient is a 41 y.o. female who was seen today for physical therapy evaluation and treatment for low back pain w/o radicular symptoms. AROM limited by soft tissue and myofascial restrictions in lumbosacral region, hip flexors as limited intersegmental restrictions limting extension     OBJECTIVE IMPAIRMENTS decreased knowledge of condition, decreased strength, impaired flexibility, postural dysfunction, and pain.    ACTIVITY LIMITATIONS carrying, lifting, sleeping, and bed mobility   PERSONAL FACTORS Fitness and Time since onset of injury/illness/exacerbation are also affecting patient's functional outcome.    REHAB POTENTIAL: Good   CLINICAL DECISION MAKING: Stable/uncomplicated   EVALUATION COMPLEXITY:  Low     GOALS: Goals reviewed with patient? No   SHORT TERM GOALS: Target date: 04/05/2022   Patient to demonstrate independence in HEP  Baseline:[id number] Goal status: INITIAL   2.  Decrease pain to 6/10 at worst Baseline: 8/10 at worst Goal status: INITIAL     LONG TERM GOALS: Target date: 04/19/2022   Increase AROM of trunk to 75% Baseline:  Active  A/PROM  eval  Flexion 50%  Extension 50%  Right lateral flexion 75%  Left lateral flexion 50%    Goal status: INITIAL   2.  Increase FOTO score to 77 Baseline: 67 Goal status: INITIAL   3.  Increase core strength to 3+/5 Baseline: 3/5 Goal status: INITIAL       PLAN: PT FREQUENCY: 2x/week   PT DURATION: 4 weeks   PLANNED INTERVENTIONS: Therapeutic exercises, Therapeutic activity, Neuromuscular re-education, Balance training, Gait training, Patient/Family education, Self Care, Joint mobilization, Stair training, Dry Needling, Electrical stimulation, Spinal mobilization, Manual therapy, and Re-evaluation.   PLAN FOR NEXT SESSION: HEP review and update, core strengthening, flexibility and ROM tasks, body mechanics training    Hildred Laser, PT 04/04/2022, 10:22 AM

## 2022-04-06 ENCOUNTER — Ambulatory Visit: Payer: Commercial Managed Care - HMO

## 2022-04-11 ENCOUNTER — Ambulatory Visit: Payer: Commercial Managed Care - HMO

## 2022-04-11 NOTE — Therapy (Deleted)
OUTPATIENT PHYSICAL THERAPY TREATMENT NOTE   Patient Name: Victoria Schneider MRN: [id number] DOB:October 06, 1980, 41 y.o., female Today's Date: 04/11/2022  PCP: Elsie Stain, MD REFERRING PROVIDER: Elsie Stain, MD  END OF SESSION:    Past Medical History:  Diagnosis Date   Acute cystitis with hematuria 03/10/2021   Elevated LFTs 06/09/2020   GERD (gastroesophageal reflux disease)    Type 2 diabetes mellitus with hyperglycemia, without long-term current use of insulin ([facility]) 05/05/2020   Past Surgical History:  Procedure Laterality Date   NO PAST SURGERIES     Patient Active Problem List   Diagnosis Date Noted   Lumbar back pain 03/16/2022   Hematuria 12/01/2021   Cervical cancer screening 03/10/2021   Menorrhagia with regular cycle 03/10/2021   Iron deficiency anemia due to chronic blood loss 03/10/2021   BMI 37.0-37.9, adult 03/10/2021   Vitamin D deficiency 03/10/2021   Hypersomnia 06/09/2020   Type 2 diabetes mellitus with hyperglycemia, without long-term current use of insulin ([facility]) 05/05/2020    REFERRING DIAG: M54.50 (ICD-10-CM) - Lumbar back pain   THERAPY DIAG: Lumbar back pain   Rationale for Evaluation and Treatment Rehabilitation  PERTINENT HISTORY: 9/28 Patient returns from the last visit that was a phone visit and September of this month.  She still having low back pain but it is less.  She denies any blood in the urine at this time.  She is yet to achieve the lumbosacral spine x-ray. Urinalysis previously has shown some hematuria.  She had a CT renal study which was negative for stones or abnormalities in the kidneys.  She continues to eat a lot of rice tortillas in her diet.  She works Education administrator houses.  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  No other complaints.  PRECAUTIONS: None  SUBJECTIVE:                                                                                                                                                                                       SUBJECTIVE STATEMENT:  ***   PAIN:  Are you having pain? {OPRCPAIN:[id number]}   OBJECTIVE: (objective measures completed at initial evaluation unless otherwise dated)   DIAGNOSTIC FINDINGS:  CLINICAL DATA:  Low back pain for 2-3 months.  No injury.   EXAM: LUMBAR SPINE - COMPLETE 4+ VIEW   COMPARISON:  08/27/2016.   FINDINGS: No fracture, bone lesion or spondylolisthesis.   Disc spaces preserved. Minor anterior endplate osteophytes, L1 through L5.   Facet degenerative changes bilaterally at L5-S1.   Soft tissues are unremarkable.   IMPRESSION: 1. No fracture or acute finding. 2. Minimal disc degenerative change. Mild bilateral facet  degenerative change at L5-S1.     Electronically Signed   By: Amie Portland M.D.   On: 03/17/2022 14:07   PATIENT SURVEYS:  FOTO 67(77 predicted)   SCREENING FOR RED FLAGS:            negative   COGNITION:           Overall cognitive status: Within functional limits for tasks assessed                          SENSATION: Not tested   MUSCLE LENGTH: Hamstrings: Right 80 deg; Left 80 deg Thomas test: positive for low back pain B   POSTURE: No Significant postural limitations   PALPATION: Increased myofascial restrictions throughout lumbosacral region   LUMBAR ROM:    Active  A/PROM  eval  Flexion 50%  Extension 50%  Right lateral flexion 75%  Left lateral flexion 50%  Right rotation    Left rotation     (Blank rows = not tested)   LOWER EXTREMITY ROM:      Active  Right eval Left eval  Hip flexion 120 120  Hip extension 10 10  Hip abduction      Hip adduction      Hip internal rotation      Hip external rotation      Knee flexion      Knee extension      Ankle dorsiflexion      Ankle plantarflexion      Ankle inversion      Ankle eversion       (Blank rows = not tested)   LOWER EXTREMITY MMT:     MMT Right eval Left eval  Hip flexion 4 4  Hip extension 4 4  Hip abduction 4  4  Hip adduction      Hip internal rotation      Hip external rotation      Knee flexion 4 4  Knee extension 4 4  Ankle dorsiflexion      Ankle plantarflexion      Ankle inversion      Ankle eversion      core 3 3      LUMBAR SPECIAL TESTS:  Straight leg raise test: Negative, Slump test: Negative, FABER test: Positive, and Thomas test: Positive   FUNCTIONAL TESTS:  5 times sit to stand: 12s arms crossed   GAIT: Distance walked: 53ft x2 Assistive device utilized: None Level of assistance: Complete Independence Comments: limited trunk rotation       TODAY'S TREATMENT  Eval and HEP     PATIENT EDUCATION:  Education details: Discussed eval findings, rehab rationale and POC and patient is in agreement  Person educated: Patient Education method: Explanation Education comprehension: verbalized understanding and needs further education     HOME EXERCISE PROGRAM: Access Code: [email or web address] URL: [email or web address] Date: 03/22/2022 Prepared by: Gustavus Bryant   Exercises - Prone Press Up On Elbows  - 2 x daily - 5 x weekly - 1 sets - 1 reps - 60s hold - Hooklying Single Knee to Chest Stretch  - 2 x daily - 5 x weekly - 1 sets - 3 reps - 30s hold - Modified Thomas Stretch  - 2 x daily - 5 x weekly - 1 sets - 3 reps - 30s hold - Curl Up with Arms Crossed  - 2 x daily - 5 x weekly - 1 sets - 10 reps   ASSESSMENT:  CLINICAL IMPRESSION: Patient is a 41 y.o. female who was seen today for physical therapy evaluation and treatment for low back pain w/o radicular symptoms. AROM limited by soft tissue and myofascial restrictions in lumbosacral region, hip flexors as limited intersegmental restrictions limting extension     OBJECTIVE IMPAIRMENTS decreased knowledge of condition, decreased strength, impaired flexibility, postural dysfunction, and pain.    ACTIVITY LIMITATIONS carrying, lifting, sleeping, and bed mobility   PERSONAL FACTORS Fitness and Time since  onset of injury/illness/exacerbation are also affecting patient's functional outcome.    REHAB POTENTIAL: Good   CLINICAL DECISION MAKING: Stable/uncomplicated   EVALUATION COMPLEXITY: Low     GOALS: Goals reviewed with patient? No   SHORT TERM GOALS: Target date: 04/05/2022   Patient to demonstrate independence in HEP  Baseline:[id number] Goal status: INITIAL   2.  Decrease pain to 6/10 at worst Baseline: 8/10 at worst Goal status: INITIAL     LONG TERM GOALS: Target date: 04/19/2022   Increase AROM of trunk to 75% Baseline:  Active  A/PROM  eval  Flexion 50%  Extension 50%  Right lateral flexion 75%  Left lateral flexion 50%    Goal status: INITIAL   2.  Increase FOTO score to 77 Baseline: 67 Goal status: INITIAL   3.  Increase core strength to 3+/5 Baseline: 3/5 Goal status: INITIAL       PLAN: PT FREQUENCY: 2x/week   PT DURATION: 4 weeks   PLANNED INTERVENTIONS: Therapeutic exercises, Therapeutic activity, Neuromuscular re-education, Balance training, Gait training, Patient/Family education, Self Care, Joint mobilization, Stair training, Dry Needling, Electrical stimulation, Spinal mobilization, Manual therapy, and Re-evaluation.   PLAN FOR NEXT SESSION: HEP review and update, core strengthening, flexibility and ROM tasks, body mechanics training    Hildred Laser, PT 04/11/2022, 9:53 AM

## 2022-04-12 ENCOUNTER — Other Ambulatory Visit: Payer: Self-pay | Admitting: Critical Care Medicine

## 2022-04-12 ENCOUNTER — Telehealth: Payer: Self-pay

## 2022-04-12 DIAGNOSIS — K1120 Sialoadenitis, unspecified: Secondary | ICD-10-CM

## 2022-04-12 NOTE — Telephone Encounter (Signed)
TC due to missed visit on 04/11/22. VM left reminding patient of next appointment.

## 2022-04-13 ENCOUNTER — Ambulatory Visit: Payer: Commercial Managed Care - HMO

## 2022-04-13 ENCOUNTER — Telehealth: Payer: Self-pay

## 2022-04-13 NOTE — Telephone Encounter (Signed)
LVM regarding missed appointment. Keeping next appointment but cancelling all future appointments afterwards. Going forward patient will only be able to schedule one at a time.   Margarette Canada, PTA 04/13/22 4:49 PM

## 2022-04-13 NOTE — Therapy (Incomplete)
OUTPATIENT PHYSICAL THERAPY TREATMENT NOTE   Patient Name: Victoria Schneider MRN: [id number] DOB:May 08, 1981, 41 y.o., female Today's Date: 04/13/2022  PCP: Storm Frisk, MD REFERRING PROVIDER: Storm Frisk, MD  END OF SESSION:    Past Medical History:  Diagnosis Date   Acute cystitis with hematuria 03/10/2021   Elevated LFTs 06/09/2020   GERD (gastroesophageal reflux disease)    Type 2 diabetes mellitus with hyperglycemia, without long-term current use of insulin ([facility]) 05/05/2020   Past Surgical History:  Procedure Laterality Date   NO PAST SURGERIES     Patient Active Problem List   Diagnosis Date Noted   Lumbar back pain 03/16/2022   Hematuria 12/01/2021   Cervical cancer screening 03/10/2021   Menorrhagia with regular cycle 03/10/2021   Iron deficiency anemia due to chronic blood loss 03/10/2021   BMI 37.0-37.9, adult 03/10/2021   Vitamin D deficiency 03/10/2021   Hypersomnia 06/09/2020   Type 2 diabetes mellitus with hyperglycemia, without long-term current use of insulin ([facility]) 05/05/2020    REFERRING DIAG: M54.50 (ICD-10-CM) - Lumbar back pain   THERAPY DIAG:  No diagnosis found.  Rationale for Evaluation and Treatment Rehabilitation  PERTINENT HISTORY: 9/28 Patient returns from the last visit that was a phone visit and September of this month.  She still having low back pain but it is less.  She denies any blood in the urine at this time.  She is yet to achieve the lumbosacral spine x-ray. Urinalysis previously has shown some hematuria.  She had a CT renal study which was negative for stones or abnormalities in the kidneys.  She continues to eat a lot of rice tortillas in her diet.  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  No other complaints.  PRECAUTIONS: None  SUBJECTIVE:                                                                                                                                                                                       SUBJECTIVE STATEMENT:  ***   PAIN:  Are you having pain? Yes: NPRS scale: ***8/10 Pain location: low back Pain description: ache Aggravating factors: sleeping, weather changes   Relieving factors: position changes, OTC meds    OBJECTIVE: (objective measures completed at initial evaluation unless otherwise dated)   (DIAGNOSTIC FINDINGS:  CLINICAL DATA:  Low back pain for 2-3 months.  No injury.   EXAM: LUMBAR SPINE - COMPLETE 4+ VIEW   COMPARISON:  08/27/2016.   FINDINGS: No fracture, bone lesion or spondylolisthesis.   Disc spaces preserved. Minor anterior endplate osteophytes, L1 through L5.   Facet degenerative changes bilaterally at  L5-S1.   Soft tissues are unremarkable.   IMPRESSION: 1. No fracture or acute finding. 2. Minimal disc degenerative change. Mild bilateral facet degenerative change at L5-S1.     Electronically Signed   By: Lajean Manes M.D.   On: 03/17/2022 14:07   PATIENT SURVEYS:  FOTO 67(77 predicted)   SCREENING FOR RED FLAGS:            negative   COGNITION:           Overall cognitive status: Within functional limits for tasks assessed                          SENSATION: Not tested   MUSCLE LENGTH: Hamstrings: Right 80 deg; Left 80 deg Thomas test: positive for low back pain B   POSTURE: No Significant postural limitations   PALPATION: Increased myofascial restrictions throughout lumbosacral region   LUMBAR ROM:    Active  A/PROM  eval  Flexion 50%  Extension 50%  Right lateral flexion 75%  Left lateral flexion 50%  Right rotation    Left rotation     (Blank rows = not tested)   LOWER EXTREMITY ROM:      Active  Right eval Left eval  Hip flexion 120 120  Hip extension 10 10  Hip abduction      Hip adduction      Hip internal rotation      Hip external rotation      Knee flexion      Knee extension      Ankle dorsiflexion      Ankle plantarflexion      Ankle inversion       Ankle eversion       (Blank rows = not tested)   LOWER EXTREMITY MMT:     MMT Right eval Left eval  Hip flexion 4 4  Hip extension 4 4  Hip abduction 4 4  Hip adduction      Hip internal rotation      Hip external rotation      Knee flexion 4 4  Knee extension 4 4  Ankle dorsiflexion      Ankle plantarflexion      Ankle inversion      Ankle eversion      core 3 3      LUMBAR SPECIAL TESTS:  Straight leg raise test: Negative, Slump test: Negative, FABER test: Positive, and Thomas test: Positive   FUNCTIONAL TESTS:  5 times sit to stand: 12s arms crossed   GAIT: Distance walked: 50ft x2 Assistive device utilized: None Level of assistance: Complete Independence Comments: limited trunk rotation       TODAY'S TREATMENT  [facility] Adult PT Treatment:                                                DATE: 04/13/2022 Therapeutic Exercise: Nustep level 5 x 5 mins Standing hip abduction/extension RTB at ankles Prone press ups Modified thomas stretch Supine 90/90 isometric hold Supine 90/90 with handheld resistance Bridges LTR Open books STS with 10# KB Manual Therapy: *** Neuromuscular re-ed: *** Therapeutic Activity: *** Modalities: *** Self Care: ***  03/22/2022: Eval and HEP     PATIENT EDUCATION:  Education details: Discussed eval findings, rehab rationale and POC and patient is in agreement  Person  educated: Patient Education method: Explanation Education comprehension: verbalized understanding and needs further education     HOME EXERCISE PROGRAM: Access Code: [email or web address] URL: [email or web address] Date: 03/22/2022 Prepared by: Gustavus Bryant   Exercises - Prone Press Up On Elbows  - 2 x daily - 5 x weekly - 1 sets - 1 reps - 60s hold - Hooklying Single Knee to Chest Stretch  - 2 x daily - 5 x weekly - 1 sets - 3 reps - 30s hold - Modified Thomas Stretch  - 2 x daily - 5 x weekly - 1 sets - 3 reps - 30s hold - Curl Up with Arms Crossed   - 2 x daily - 5 x weekly - 1 sets - 10 reps   ASSESSMENT:   CLINICAL IMPRESSION: ***  Patient is a 41 y.o. female who was seen today for physical therapy evaluation and treatment for low back pain w/o radicular symptoms. AROM limited by soft tissue and myofascial restrictions in lumbosacral region, hip flexors as limited intersegmental restrictions limting extension     OBJECTIVE IMPAIRMENTS decreased knowledge of condition, decreased strength, impaired flexibility, postural dysfunction, and pain.    ACTIVITY LIMITATIONS carrying, lifting, sleeping, and bed mobility   PERSONAL FACTORS Fitness and Time since onset of injury/illness/exacerbation are also affecting patient's functional outcome.    REHAB POTENTIAL: Good   CLINICAL DECISION MAKING: Stable/uncomplicated   EVALUATION COMPLEXITY: Low     GOALS: Goals reviewed with patient? No   SHORT TERM GOALS: Target date: 04/05/2022   Patient to demonstrate independence in HEP  Baseline:[id number] Goal status: INITIAL   2.  Decrease pain to 6/10 at worst Baseline: 8/10 at worst Goal status: INITIAL     LONG TERM GOALS: Target date: 04/19/2022   Increase AROM of trunk to 75% Baseline:  Active  A/PROM  eval  Flexion 50%  Extension 50%  Right lateral flexion 75%  Left lateral flexion 50%    Goal status: INITIAL   2.  Increase FOTO score to 77 Baseline: 67 Goal status: INITIAL   3.  Increase core strength to 3+/5 Baseline: 3/5 Goal status: INITIAL       PLAN: PT FREQUENCY: 2x/week   PT DURATION: 4 weeks   PLANNED INTERVENTIONS: Therapeutic exercises, Therapeutic activity, Neuromuscular re-education, Balance training, Gait training, Patient/Family education, Self Care, Joint mobilization, Stair training, Dry Needling, Electrical stimulation, Spinal mobilization, Manual therapy, and Re-evaluation.   PLAN FOR NEXT SESSION: HEP review and update, core strengthening, flexibility and ROM tasks, body mechanics  training    Berta Minor, PTA 04/13/2022, 9:10 AM

## 2022-04-18 ENCOUNTER — Ambulatory Visit: Payer: Commercial Managed Care - HMO

## 2022-04-18 NOTE — Therapy (Incomplete)
OUTPATIENT PHYSICAL THERAPY TREATMENT NOTE   Patient Name: Victoria Schneider MRN: [id number] DOB:[birth year], 41 y.o., female Today's Date: 04/18/2022  PCP: Storm Frisk, MD REFERRING PROVIDER: Storm Frisk, MD  END OF SESSION:    Past Medical History:  Diagnosis Date   Acute cystitis with hematuria 03/10/2021   Elevated LFTs 06/09/2020   GERD (gastroesophageal reflux disease)    Type 2 diabetes mellitus with hyperglycemia, without long-term current use of insulin ([facility]) 05/05/2020   Past Surgical History:  Procedure Laterality Date   NO PAST SURGERIES     Patient Active Problem List   Diagnosis Date Noted   Lumbar back pain 03/16/2022   Hematuria 12/01/2021   Cervical cancer screening 03/10/2021   Menorrhagia with regular cycle 03/10/2021   Iron deficiency anemia due to chronic blood loss 03/10/2021   BMI 37.0-37.9, adult 03/10/2021   Vitamin D deficiency 03/10/2021   Hypersomnia 06/09/2020   Type 2 diabetes mellitus with hyperglycemia, without long-term current use of insulin ([facility]) 05/05/2020    REFERRING DIAG: M54.50 (ICD-10-CM) - Lumbar back pain   THERAPY DIAG:  No diagnosis found.  Rationale for Evaluation and Treatment Rehabilitation  PERTINENT HISTORY: 9/28 Patient returns from the last visit that was a phone visit and September of this month.  She still having low back pain but it is less.  She denies any blood in the urine at this time.  She is yet to achieve the lumbosacral spine x-ray. Urinalysis previously has shown some hematuria.  She had a CT renal study which was negative for stones or abnormalities in the kidneys.  She continues to eat a lot of rice tortillas in her diet.  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  No other complaints.  PRECAUTIONS: None  SUBJECTIVE:                                                                                                                                                                                       SUBJECTIVE STATEMENT:  ***   PAIN:  Are you having pain? Yes: NPRS scale: ***8/10 Pain location: low back Pain description: ache Aggravating factors: sleeping, weather changes   Relieving factors: position changes, OTC meds    OBJECTIVE: (objective measures completed at initial evaluation unless otherwise dated)   (DIAGNOSTIC FINDINGS:  CLINICAL DATA:  Low back pain for 2-3 months.  No injury.   EXAM: LUMBAR SPINE - COMPLETE 4+ VIEW   COMPARISON:  08/27/2016.   FINDINGS: No fracture, bone lesion or spondylolisthesis.   Disc spaces preserved. Minor anterior endplate osteophytes, L1 through L5.   Facet degenerative changes bilaterally at  L5-S1.   Soft tissues are unremarkable.   IMPRESSION: 1. No fracture or acute finding. 2. Minimal disc degenerative change. Mild bilateral facet degenerative change at L5-S1.     Electronically Signed   By: Amie Portland M.D.   On: 03/17/2022 14:07   PATIENT SURVEYS:  FOTO 67(77 predicted)   SCREENING FOR RED FLAGS:            negative   COGNITION:           Overall cognitive status: Within functional limits for tasks assessed                          SENSATION: Not tested   MUSCLE LENGTH: Hamstrings: Right 80 deg; Left 80 deg Thomas test: positive for low back pain B   POSTURE: No Significant postural limitations   PALPATION: Increased myofascial restrictions throughout lumbosacral region   LUMBAR ROM:    Active  A/PROM  eval  Flexion 50%  Extension 50%  Right lateral flexion 75%  Left lateral flexion 50%  Right rotation    Left rotation     (Blank rows = not tested)   LOWER EXTREMITY ROM:      Active  Right eval Left eval  Hip flexion 120 120  Hip extension 10 10  Hip abduction      Hip adduction      Hip internal rotation      Hip external rotation      Knee flexion      Knee extension      Ankle dorsiflexion      Ankle plantarflexion      Ankle inversion       Ankle eversion       (Blank rows = not tested)   LOWER EXTREMITY MMT:     MMT Right eval Left eval  Hip flexion 4 4  Hip extension 4 4  Hip abduction 4 4  Hip adduction      Hip internal rotation      Hip external rotation      Knee flexion 4 4  Knee extension 4 4  Ankle dorsiflexion      Ankle plantarflexion      Ankle inversion      Ankle eversion      core 3 3      LUMBAR SPECIAL TESTS:  Straight leg raise test: Negative, Slump test: Negative, FABER test: Positive, and Thomas test: Positive   FUNCTIONAL TESTS:  5 times sit to stand: 12s arms crossed   GAIT: Distance walked: 105ft x2 Assistive device utilized: None Level of assistance: Complete Independence Comments: limited trunk rotation       TODAY'S TREATMENT  [facility] Adult PT Treatment:                                                DATE: 04/18/2022 Therapeutic Exercise: Nustep level 5 x 5 mins Standing hip abduction/extension RTB at ankles Prone press ups Modified thomas stretch Supine 90/90 isometric hold Supine 90/90 with handheld resistance Bridges LTR Open books STS with 10# KB  03/22/2022: Eval and HEP     PATIENT EDUCATION:  Education details: Discussed eval findings, rehab rationale and POC and patient is in agreement  Person educated: Patient Education method: Explanation Education comprehension: verbalized understanding and needs further education  HOME EXERCISE PROGRAM: Access Code: [email or web address] URL: [email or web address] Date: 03/22/2022 Prepared by: Sharlynn Oliphant   Exercises - Prone Press Up On Elbows  - 2 x daily - 5 x weekly - 1 sets - 1 reps - 60s hold - Hooklying Single Knee to Chest Stretch  - 2 x daily - 5 x weekly - 1 sets - 3 reps - 30s hold - Modified Thomas Stretch  - 2 x daily - 5 x weekly - 1 sets - 3 reps - 30s hold - Curl Up with Arms Crossed  - 2 x daily - 5 x weekly - 1 sets - 10 reps   ASSESSMENT:   CLINICAL IMPRESSION: ***  Patient is a 41  y.o. female who was seen today for physical therapy evaluation and treatment for low back pain w/o radicular symptoms. AROM limited by soft tissue and myofascial restrictions in lumbosacral region, hip flexors as limited intersegmental restrictions limting extension     OBJECTIVE IMPAIRMENTS decreased knowledge of condition, decreased strength, impaired flexibility, postural dysfunction, and pain.    ACTIVITY LIMITATIONS carrying, lifting, sleeping, and bed mobility   PERSONAL FACTORS Fitness and Time since onset of injury/illness/exacerbation are also affecting patient's functional outcome.    REHAB POTENTIAL: Good   CLINICAL DECISION MAKING: Stable/uncomplicated   EVALUATION COMPLEXITY: Low     GOALS: Goals reviewed with patient? No   SHORT TERM GOALS: Target date: 04/05/2022   Patient to demonstrate independence in HEP  Baseline:[id number] Goal status: INITIAL   2.  Decrease pain to 6/10 at worst Baseline: 8/10 at worst Goal status: INITIAL     LONG TERM GOALS: Target date: 04/19/2022   Increase AROM of trunk to 75% Baseline:  Active  A/PROM  eval  Flexion 50%  Extension 50%  Right lateral flexion 75%  Left lateral flexion 50%    Goal status: INITIAL   2.  Increase FOTO score to 77 Baseline: 67 Goal status: INITIAL   3.  Increase core strength to 3+/5 Baseline: 3/5 Goal status: INITIAL       PLAN: PT FREQUENCY: 2x/week   PT DURATION: 4 weeks   PLANNED INTERVENTIONS: Therapeutic exercises, Therapeutic activity, Neuromuscular re-education, Balance training, Gait training, Patient/Family education, Self Care, Joint mobilization, Stair training, Dry Needling, Electrical stimulation, Spinal mobilization, Manual therapy, and Re-evaluation.   PLAN FOR NEXT SESSION: HEP review and update, core strengthening, flexibility and ROM tasks, body mechanics training    Margarette Canada, PTA 04/18/2022, 10:16 AM

## 2022-04-20 ENCOUNTER — Ambulatory Visit: Payer: Commercial Managed Care - HMO

## 2022-05-03 ENCOUNTER — Ambulatory Visit: Payer: Self-pay | Admitting: Physician Assistant

## 2022-05-03 ENCOUNTER — Ambulatory Visit: Payer: 59 | Admitting: Critical Care Medicine

## 2022-05-14 ENCOUNTER — Encounter: Payer: Self-pay | Admitting: Emergency Medicine

## 2022-05-14 ENCOUNTER — Other Ambulatory Visit: Payer: Self-pay

## 2022-05-14 ENCOUNTER — Ambulatory Visit
Admission: EM | Admit: 2022-05-14 | Discharge: 2022-05-14 | Disposition: A | Discharge status: Home / Self Care | Payer: Commercial Managed Care - HMO | Attending: Physician Assistant | Admitting: Physician Assistant

## 2022-05-14 DIAGNOSIS — N3000 Acute cystitis without hematuria: Secondary | ICD-10-CM | POA: Diagnosis present

## 2022-05-14 LAB — POCT URINALYSIS DIP (MANUAL ENTRY)
Bilirubin, UA: NEGATIVE
Glucose, UA: 100 mg/dL — AB
Ketones, POC UA: NEGATIVE mg/dL
Nitrite, UA: POSITIVE — AB
Protein Ur, POC: 100 mg/dL — AB
Spec Grav, UA: 1.01 (ref 1.010–1.025)
Urobilinogen, UA: 2 E.U./dL — AB
pH, UA: 7 (ref 5.0–8.0)

## 2022-05-14 MED ORDER — NITROFURANTOIN MONOHYD MACRO 100 MG PO CAPS: 100.0000 mg | ORAL_CAPSULE | Freq: Two times a day (BID) | ORAL | 0 refills | Status: DC

## 2022-05-14 NOTE — ED Triage Notes (Signed)
Pt here for dysuria x 5 days

## 2022-05-14 NOTE — ED Provider Notes (Signed)
[name]    CSN: [id number] Arrival date & time: 05/14/22  1105      History   Chief Complaint Chief Complaint  Patient presents with   Dysuria    HPI Victoria Schneider is a 41 y.o. female.   Patient here today for evaluation dysuria she has had for 5 days.  She reports suprapubic pain as well.  She denies any back pain and has not had any fever.  She denies any nausea or vomiting.  She states she did take Azo with mild relief but symptoms have persisted.  The history is provided by the patient.  Dysuria Associated symptoms: abdominal pain   Associated symptoms: no fever, no nausea and no vomiting     Past Medical History:  Diagnosis Date   Acute cystitis with hematuria 03/10/2021   Elevated LFTs 06/09/2020   GERD (gastroesophageal reflux disease)    Type 2 diabetes mellitus with hyperglycemia, without long-term current use of insulin ([place]) 05/05/2020    Patient Active Problem List   Diagnosis Date Noted   Lumbar back pain 03/16/2022   Hematuria 12/01/2021   Cervical cancer screening 03/10/2021   Menorrhagia with regular cycle 03/10/2021   Iron deficiency anemia due to chronic blood loss 03/10/2021   BMI 37.0-37.9, adult 03/10/2021   Vitamin D deficiency 03/10/2021   Hypersomnia 06/09/2020   Type 2 diabetes mellitus with hyperglycemia, without long-term current use of insulin ([place]) 05/05/2020    Past Surgical History:  Procedure Laterality Date   NO PAST SURGERIES      OB History     Gravida  4   Para  4   Term  4   Preterm      AB      Living  4      SAB      IAB      Ectopic      Multiple  0   Live Births  4            Home Medications    Prior to Admission medications   Medication Sig Start Date End Date Taking? Authorizing Provider  nitrofurantoin, macrocrystal-monohydrate, (MACROBID) 100 MG capsule Take 1 capsule (100 mg total) by mouth 2 (two) times daily. 05/14/22  Yes Francene Finders, PA-C   acetaminophen (TYLENOL) 325 MG tablet Take 650 mg by mouth every 6 (six) hours as needed.    [provider]  Blood Glucose Monitoring Suppl (TRUE METRIX METER) w/Device KIT 1 each by Does not apply route in the morning and at bedtime. 05/05/20   Argentina Donovan, PA-C  cetirizine (ZYRTEC) 10 MG tablet TAKE 1 TABLET BY MOUTH ONCE DAILY AS NEEDED FOR ALLERGIES 04/12/22   Elsie Stain, MD  cyclobenzaprine (FLEXERIL) 5 MG tablet Take 1 tablet (5 mg total) by mouth 3 (three) times daily as needed for muscle spasms. 03/16/22   Elsie Stain, MD  doxycycline (VIBRAMYCIN) 100 MG capsule Take 1 capsule (100 mg total) by mouth 2 (two) times daily. 03/28/22   Francene Finders, PA-C  ferrous sulfate 325 (65 FE) MG tablet Take 1 tablet (325 mg total) by mouth daily. 12/01/21   Elsie Stain, MD  fluticasone (FLONASE) 50 MCG/ACT nasal spray PLACE 2 SPRAYS INTO BOTH NOSTRILS DAILY. 03/16/22 03/16/23  Elsie Stain, MD  glucose blood (TRUE METRIX BLOOD GLUCOSE TEST) test strip Use as instructed 12/01/21   Elsie Stain, MD  ibuprofen (ADVIL) 600 MG tablet Take 1 tablet (  600 mg total) by mouth every 6 (six) hours as needed. 02/05/22   Melynda Ripple, MD  metFORMIN (GLUCOPHAGE) 500 MG tablet 2 tabs daily with dinner 03/16/22   Elsie Stain, MD  TRUEplus Lancets 28G MISC USE AS DIRECTED IN THE MORNING AND AT BEDTIME 12/01/21 12/01/22  Elsie Stain, MD  Vitamin D, Ergocalciferol, (DRISDOL) 1.25 MG (50000 UNIT) CAPS capsule Take 1 capsule (50,000 Units total) by mouth every 7 (seven) days. 03/16/22   Elsie Stain, MD  albuterol (VENTOLIN HFA) 108 (90 Base) MCG/ACT inhaler Inhale 1-2 puffs into the lungs every 6 (six) hours as needed for wheezing or shortness of breath. Patient not taking: Reported on 08/06/2019 07/15/19 02/09/20  Hall-Potvin, [place], PA-C  famotidine (PEPCID) 20 MG tablet One after supper Patient not taking: Reported on 08/06/2019 01/23/19 02/09/20  Tanda Rockers, MD     Family History Family History  Problem Relation Age of Onset   Diabetes Father    Hypertension Father    Diabetes Brother    Hypertension Mother    Depression Daughter     Social History Social History   Tobacco Use   Smoking status: Never   Smokeless tobacco: Never  Vaping Use   Vaping Use: Never used  Substance Use Topics   Alcohol use: Not Currently    Comment: rarely   Drug use: No     Allergies   Penicillins   Review of Systems Review of Systems  Constitutional:  Negative for chills and fever.  Eyes:  Negative for discharge and redness.  Respiratory:  Negative for shortness of breath.   Gastrointestinal:  Positive for abdominal pain. Negative for nausea and vomiting.  Genitourinary:  Positive for dysuria and frequency.  Musculoskeletal:  Negative for back pain.     Physical Exam Triage Vital Signs ED Triage Vitals  Enc Vitals Group     BP 05/14/22 1302 125/81     Pulse Rate 05/14/22 1302 90     Resp 05/14/22 1302 18     Temp 05/14/22 1302 98.1 F (36.7 C)     Temp Source 05/14/22 1302 Oral     SpO2 05/14/22 1302 95 %     Weight --      Height --      Head Circumference --      Peak Flow --      Pain Score 05/14/22 1303 3     Pain Loc --      Pain Edu? --      Excl. in [place]? --    No data found.  Updated Vital Signs BP 125/81 (BP Location: Left Arm)   Pulse 90   Temp 98.1 F (36.7 C) (Oral)   Resp 18   SpO2 95%   Physical Exam Vitals and nursing note reviewed.  Constitutional:      General: She is not in acute distress.    Appearance: Normal appearance. She is not ill-appearing.  HENT:     Head: Normocephalic and atraumatic.  Eyes:     Conjunctiva/sclera: Conjunctivae normal.  Cardiovascular:     Rate and Rhythm: Normal rate.  Pulmonary:     Effort: Pulmonary effort is normal.  Neurological:     Mental Status: She is alert.  Psychiatric:        Mood and Affect: Mood normal.        Behavior: Behavior normal.        Thought  Content: Thought content normal.  UC Treatments / Results  Labs (all labs ordered are listed, but only abnormal results are displayed) Labs Reviewed  POCT URINALYSIS DIP (MANUAL ENTRY) - Abnormal; Notable for the following components:      Result Value   Color, UA orange (*)    Clarity, UA cloudy (*)    Glucose, UA =100 (*)    Blood, UA moderate (*)    Protein Ur, POC =100 (*)    Urobilinogen, UA 2.0 (*)    Nitrite, UA Positive (*)    Leukocytes, UA Large (3+) (*)    All other components within normal limits  URINE CULTURE    EKG   Radiology No results found.  Procedures Procedures (including critical care time)  Medications Ordered in UC Medications - No data to display  Initial Impression / Assessment and Plan / UC Course  I have reviewed the triage vital signs and the nursing notes.  Pertinent labs & imaging results that were available during my care of the patient were reviewed by me and considered in my medical decision making (see chart for details).    UA unreliable due to color interference, Macrobid prescribed to cover UTI and urine culture ordered.  Recommended follow-up with any further concerns.  Patient expresses understanding.  Final Clinical Impressions(s) / UC Diagnoses   Final diagnoses:  Acute cystitis without hematuria   Discharge Instructions   None    ED Prescriptions     Medication Sig Dispense Auth. Provider   nitrofurantoin, macrocrystal-monohydrate, (MACROBID) 100 MG capsule Take 1 capsule (100 mg total) by mouth 2 (two) times daily. 10 capsule Francene Finders, PA-C      PDMP not reviewed this encounter.   Francene Finders, PA-C 05/14/22 [phone]

## 2022-05-15 LAB — URINE CULTURE

## 2022-05-18 ENCOUNTER — Other Ambulatory Visit: Payer: Self-pay

## 2022-05-18 ENCOUNTER — Encounter: Payer: Self-pay | Admitting: Critical Care Medicine

## 2022-05-18 ENCOUNTER — Ambulatory Visit: Payer: Commercial Managed Care - HMO | Attending: Critical Care Medicine | Admitting: Critical Care Medicine

## 2022-05-18 VITALS — BP 130/80 | HR 86 | Ht 65.0 in | Wt 220.2 lb

## 2022-05-18 DIAGNOSIS — N3001 Acute cystitis with hematuria: Secondary | ICD-10-CM | POA: Diagnosis not present

## 2022-05-18 DIAGNOSIS — E1165 Type 2 diabetes mellitus with hyperglycemia: Secondary | ICD-10-CM

## 2022-05-18 MED ORDER — VITAMIN D (ERGOCALCIFEROL) 1.25 MG (50000 UNIT) PO CAPS
50000.0000 [IU] | ORAL_CAPSULE | ORAL | 3 refills | Status: DC
  Filled 2022-05-18: qty 4, 28d supply, fill #0

## 2022-05-18 MED ORDER — TRULICITY 0.75 MG/0.5ML ~~LOC~~ SOAJ
0.7500 mg | SUBCUTANEOUS | 4 refills | Status: DC
  Filled 2022-05-18: qty 2, 28d supply, fill #0

## 2022-05-18 MED ORDER — METFORMIN HCL 500 MG PO TABS
ORAL_TABLET | ORAL | 3 refills | Status: DC
  Filled 2022-05-18: qty 90, 30d supply, fill #0
  Filled 2022-08-17: qty 90, 30d supply, fill #1

## 2022-05-18 MED ORDER — SULFAMETHOXAZOLE-TRIMETHOPRIM 800-160 MG PO TABS
1.0000 | ORAL_TABLET | Freq: Two times a day (BID) | ORAL | 0 refills | Status: AC
  Filled 2022-05-18: qty 14, 7d supply, fill #0

## 2022-05-18 NOTE — Assessment & Plan Note (Signed)
Blood sugars have been running higher will increase metformin to 1000 mg a.m. 500 mg p.m. and begin Trulicity 0.75 mcg weekly she was given a demonstration pen to see how that works and a prescription will be given under patient assistance

## 2022-05-18 NOTE — Assessment & Plan Note (Signed)
Recurrent acute cystitis with chronic hematuria will need urology referral we were able to get her in with the fellows faculty practice at [facility] urology for evaluation as she cannot get into the [facility] practice it does not accept her insurance  Will give a course of Bactrim double strength twice daily for 7 days  Will obtain another urine culture and urinalysis

## 2022-05-18 NOTE — Progress Notes (Signed)
UTI with no relief from antibiotics. Burning, itching and frequency with urinating

## 2022-05-18 NOTE — Progress Notes (Signed)
Established Patient Office Visit  Subjective:  Patient ID: Victoria Schneider, female    DOB: 04-Nov-1980  Age: 41 y.o. MRN: [id number]    History of Present Illness:   CC:  Recurrent bladder pain and dysuria  HPI 02/2021 Victoria Schneider presents for primary care follow-up visit last seen December 2021.  Patient has been noting periods of blood in the urine without dysuria.  She was seen previously this year in the emergency room for acute cystitis with hematuria.  Treated with a course of antibiotics for E. coli.  Patient does note excess menses with each menstrual period.  Patient does maintain metformin 1000 mg a.m. 500 mg p.m. for type 2 diabetes.  A1c had been controlled previously.  Blood sugar on arrival today 115 patient has also mild iron deficiency anemia from previous lab testing for which she is on iron supplementation. Patient does have mild vitamin D deficiency and is on vitamin D supplement.  Prior history of hepatic steatosis with elevated liver functions for which use of statin was held.  Patient still has significant obesity BMI of nearly 38.  Patient does state her bilateral wrist pain has been improved suspected to be due to carpal tunnel syndrome and doing well with wrist splints at night.  Patient also has hypersomnia with excess snoring.  She was not able to follow through with a referral to sleep medicine.  12/01/21 patient did not need interpreter This is a return visit from prior visit in September 2022.  In the interim the patient was seen in May for a bout of sinusitis from which she has resolved.  She does still have some ear popping and was given Zyrtec-D and Flonase by the PA.  Patient also had a visit in March was found to have hematuria and iron deficiency anemia referred to urology she has never been able to complete the appointment as she did not receive a phone call but they state they tried to call with no answer.  She is also had low back pain and neck  pain that is gotten worse and her weight is up to 215 pounds.  Note in March with her diabetes she was screened again has an A1c of 6.2 and is on the metformin at 1000 mg daily.  She had low vitamin D levels and was recommended to take vitamin D but she is not actually on any supplements at this time.  Today she needs a urine microalbumin assessed she needs a Pap smear foot exam and needs to get her eye exam performed  Patient still notes some blood in the urine and has occasional vaginal discharge.  There are no other complaints.  She is due a pneumonia vaccine she agrees to receive at this visit.   Below is assessment from the PA visit in March [place] 08/2021   1. Type 2 diabetes mellitus with hyperglycemia, without long-term current use of insulin (HCC) Controlled on the dose she is actually taking which I will send prescription she is currently taking it now - Glucose (CBG) - HgB A1c - POCT URINALYSIS DIP (CLINITEK) - Comprehensive metabolic panel - metFORMIN (GLUCOPHAGE) 500 MG tablet; 2 tabs daily with dinner  Dispense: 60 tablet; Refill: 3   2. Other microscopic hematuria No infection-increase water intake to 80-100 ounces water daily - POCT URINALYSIS DIP (CLINITEK) - Ambulatory referral to Urology   3. Dysfunction of both eustachian tubes Flonase sent - fluticasone (FLONASE) 50 MCG/ACT nasal spray; PLACE 2 SPRAYS INTO  BOTH NOSTRILS DAILY.  Dispense: 16 g; Refill: 6   4. Iron deficiency anemia due to chronic blood loss - ferrous sulfate 325 (65 FE) MG tablet; Take 1 tablet (325 mg total) by mouth daily.  Dispense: 100 tablet; Refill: 1 - CBC with Differential/Platelet   6. Allergy, subsequent encounter - cetirizine-pseudoephedrine (ZYRTEC-D) 5-120 MG tablet; Take 1 tablet by mouth 2 (two) times daily. prn  Dispense: 60 tablet; Refill: 1 - fluticasone (FLONASE) 50 MCG/ACT nasal spray; PLACE 2 SPRAYS INTO BOTH NOSTRILS DAILY.  Dispense: 16 g; Refill: 6   7. Vitamin D  deficiency - Vitamin D, 25-hydroxy   8. Chronic right-sided low back pain without sciatica - ibuprofen (ADVIL) 800 MG tablet; Take 1 tablet (800 mg total) by mouth 3 (three) times daily. Prn pain  Dispense: 30 tablet; Refill: 0 - methocarbamol (ROBAXIN) 500 MG tablet; Take 2 tablets (1,000 mg total) by mouth every 8 (eight) hours as needed for muscle spasms.  Dispense: 90 tablet; Refill: 0       The patient never received her Pap smear never got the visit with gynecology.  9/11 This patient is seen in return follow-up by way of a phone visit she has had continued flank pain bilaterally and was seen previously this year for blood in the urine by urology in [place].  Cystoscopy was done was normal repeat urinalysis showed blood in the urine but urine cultures were negative she had a visit here in our clinic recently had urine cultures done which showed multiple organisms which suggested repeat collection.  She is yet to have a CT renal study but she did have a renal ultrasound that was unremarkable did not show hydronephrosis.  Patient was not able to come to the office today and instead was set up for a virtual visit.  She still having flank pain.  She has been to urgent care more recently.  She was given Bactrim and this helped to some degree but her symptoms have recurred.  9/28 Patient returns from the last visit that was a phone visit and September of this month.  She still having low back pain but it is less.  She denies any blood in the urine at this time.  She is yet to achieve the lumbosacral spine x-ray. Urinalysis previously has shown some hematuria.  She had a CT renal study which was negative for stones or abnormalities in the kidneys.  She continues to eat a lot of rice tortillas in her diet.  She works Education administrator houses.  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  No other complaints.  11/30 Patient returns still having difficulty with dysuria and hematuria and had been to the emergency  room where she was evaluated had blood in the urine but urine culture showed multiple species contaminated.  Note she declines flu vaccine and blood pressure is good at this visit.  Patient does have lower abdominal pain with current symptom complex she was referred to urology but they declined to see her because she has [employer]. Past Medical History:  Diagnosis Date   Acute cystitis with hematuria 03/10/2021   Elevated LFTs 06/09/2020   GERD (gastroesophageal reflux disease)    Type 2 diabetes mellitus with hyperglycemia, without long-term current use of insulin ([place]) 05/05/2020    Past Surgical History:  Procedure Laterality Date   NO PAST SURGERIES      Family History  Problem Relation Age of Onset   Diabetes Father    Hypertension Father  Diabetes Brother    Hypertension Mother    Depression Daughter     Social History   Socioeconomic History   Marital status: Single    Spouse name: Not on file   Number of children: Not on file   Years of education: Not on file   Highest education level: Not on file  Occupational History   Not on file  Tobacco Use   Smoking status: Never   Smokeless tobacco: Never  Vaping Use   Vaping Use: Never used  Substance and Sexual Activity   Alcohol use: Not Currently    Comment: rarely   Drug use: No   Sexual activity: Yes    Birth control/protection: None  Other Topics Concern   Not on file  Social History Narrative   Not on file   Social Determinants of Health   Financial Resource Strain: Not on file  Food Insecurity: Not on file  Transportation Needs: Not on file  Physical Activity: Not on file  Stress: Not on file  Social Connections: Not on file  Intimate Partner Violence: Not on file    Outpatient Medications Prior to Visit  Medication Sig Dispense Refill   ferrous sulfate 325 (65 FE) MG tablet Take 1 tablet (325 mg total) by mouth daily. 100 tablet 1   fluticasone (FLONASE) 50 MCG/ACT nasal spray PLACE 2  SPRAYS INTO BOTH NOSTRILS DAILY. 16 g 6   metFORMIN (GLUCOPHAGE) 500 MG tablet 2 tabs daily with dinner (Patient taking differently: 2 am and 1 pm) 60 tablet 3   acetaminophen (TYLENOL) 325 MG tablet Take 650 mg by mouth every 6 (six) hours as needed.     Blood Glucose Monitoring Suppl (TRUE METRIX METER) w/Device KIT 1 each by Does not apply route in the morning and at bedtime. 1 kit 0   cetirizine (ZYRTEC) 10 MG tablet TAKE 1 TABLET BY MOUTH ONCE DAILY AS NEEDED FOR ALLERGIES 30 tablet 3   cyclobenzaprine (FLEXERIL) 5 MG tablet Take 1 tablet (5 mg total) by mouth 3 (three) times daily as needed for muscle spasms. 60 tablet 1   glucose blood (TRUE METRIX BLOOD GLUCOSE TEST) test strip Use as instructed 100 each 12   ibuprofen (ADVIL) 600 MG tablet Take 1 tablet (600 mg total) by mouth every 6 (six) hours as needed. 30 tablet 0   TRUEplus Lancets 28G MISC USE AS DIRECTED IN THE MORNING AND AT BEDTIME 100 each 2   doxycycline (VIBRAMYCIN) 100 MG capsule Take 1 capsule (100 mg total) by mouth 2 (two) times daily. (Patient not taking: Reported on 05/18/2022) 20 capsule 0   nitrofurantoin, macrocrystal-monohydrate, (MACROBID) 100 MG capsule Take 1 capsule (100 mg total) by mouth 2 (two) times daily. 10 capsule 0   Vitamin D, Ergocalciferol, (DRISDOL) 1.25 MG (50000 UNIT) CAPS capsule Take 1 capsule (50,000 Units total) by mouth every 7 (seven) days. 16 capsule 3   No facility-administered medications prior to visit.    Allergies  Allergen Reactions   Penicillins Rash and Other (See Comments)    Has patient had a PCN reaction causing immediate rash, facial/tongue/throat swelling, SOB or lightheadedness with hypotension: No Has patient had a PCN reaction causing severe rash involving mucus membranes or skin necrosis: No Has patient had a PCN reaction that required hospitalization No Has patient had a PCN reaction occurring within the last 10 years: Yes If all of the above answers are "NO", then may  proceed with Cephalosporin use.     ROS Review  of Systems  Constitutional:  Negative for chills, diaphoresis and fever.  HENT:  Negative for congestion, hearing loss, nosebleeds, sore throat and tinnitus.   Eyes:  Negative for photophobia and redness.  Respiratory:  Negative for cough, shortness of breath, wheezing and stridor.   Cardiovascular:  Negative for chest pain, palpitations and leg swelling.  Gastrointestinal:  Negative for abdominal pain, blood in stool, constipation, diarrhea, nausea and vomiting.  Endocrine: Negative for polydipsia.  Genitourinary:  Positive for dysuria, hematuria, pelvic pain and urgency. Negative for flank pain, frequency and menstrual problem.  Musculoskeletal:  Positive for back pain. Negative for myalgias and neck pain.  Skin:  Negative for rash.  Allergic/Immunologic: Negative for environmental allergies.  Neurological:  Negative for dizziness, tremors, seizures, weakness and headaches.  Hematological:  Does not bruise/bleed easily.  Psychiatric/Behavioral:  Negative for suicidal ideas. The patient is not nervous/anxious.       Objective:    Vitals:   05/18/22 0928  BP: 130/80  Pulse: 86  SpO2: 98%  Weight: 220 lb 3.2 oz (99.9 kg)  Height: _0  (1.651 m)    Gen: Pleasant, well-nourished, in no distress,  normal affect  ENT: No lesions,  mouth clear,  oropharynx clear, no postnasal drip  Neck: No JVD, no TMG, no carotid bruits  Lungs: No use of accessory muscles, no dullness to percussion, clear without rales or rhonchi  Cardiovascular: RRR, heart sounds normal, no murmur or gallops, no peripheral edema  Abdomen: Suprapubic tenderness no HSM,  BS normal  Musculoskeletal: No deformities, no cyanosis or clubbing  Neuro: alert, non focal  Skin: Warm, no lesions or rashes  No results found.   BP 130/80   Pulse 86   Ht _1  (1.651 m)   Wt 220 lb 3.2 oz (99.9 kg)   LMP 05/04/2022 (Exact Date)   SpO2 98%   BMI 36.64 kg/m  Wt  Readings from Last 3 Encounters:  05/18/22 220 lb 3.2 oz (99.9 kg)  03/16/22 224 lb 3.2 oz (101.7 kg)  02/09/22 222 lb (100.7 kg)   Vitals:   05/18/22 0928  BP: 130/80  Pulse: 86  SpO2: 98%  Weight: 220 lb 3.2 oz (99.9 kg)  Height: _2  (1.651 m)    Gen: Pleasant, well-nourished, in no distress,  normal affect  ENT: No lesions,  mouth clear,  oropharynx clear, no postnasal drip  Neck: No JVD, no TMG, no carotid bruits  Lungs: No use of accessory muscles, no dullness to percussion, clear without rales or rhonchi  Cardiovascular: RRR, heart sounds normal, no murmur or gallops, no peripheral edema  Abdomen: soft and NT, no HSM,  BS normal  Musculoskeletal: No deformities, no cyanosis or clubbing, muscles and lumbar spine paraspinal are spastic and tender  Neuro: alert, non focal  Skin: Warm, no lesions or rashes  No results found.   Health Maintenance Due  Topic Date Due   OPHTHALMOLOGY EXAM  Never done    There are no preventive care reminders to display for this patient.  Lab Results  Component Value Date   TSH 2.670 10/14/2020   Lab Results  Component Value Date   WBC 7.8 02/28/2022   HGB 12.3 02/28/2022   HCT 38.8 02/28/2022   MCV 78 (L) 02/28/2022   PLT 344 02/28/2022   Lab Results  Component Value Date   NA 141 02/28/2022   K 4.2 02/28/2022   CO2 26 02/28/2022   GLUCOSE 115 (H) 02/28/2022   BUN 7 02/28/2022  CREATININE 0.69 02/28/2022   BILITOT 0.6 08/25/2021   ALKPHOS 141 (H) 08/25/2021   AST 22 08/25/2021   ALT 26 08/25/2021   PROT 7.4 08/25/2021   ALBUMIN 4.3 08/25/2021   CALCIUM 9.7 02/28/2022   ANIONGAP 9 07/22/2017   EGFR 111 02/28/2022   Lab Results  Component Value Date   CHOL 175 05/26/2020   Lab Results  Component Value Date   HDL 56 05/26/2020   Lab Results  Component Value Date   LDLCALC 102 (H) 05/26/2020   Lab Results  Component Value Date   TRIG 93 05/26/2020   Lab Results  Component Value Date   CHOLHDL 3.1  05/26/2020   Lab Results  Component Value Date   HGBA1C 6.9 (H) 02/28/2022      Assessment & Plan:   Problem List Items Addressed This Visit    I personally reviewed all images and lab data in the [facility] system as well as any outside material available during this office visit and agree with the  radiology impressions.   Acute cystitis with hematuria Recurrent acute cystitis with chronic hematuria will need urology referral we were able to get her in with the fellows faculty practice at [facility] urology for evaluation as she cannot get into the [facility] practice it does not accept her insurance  Will give a course of Bactrim double strength twice daily for 7 days  Will obtain another urine culture and urinalysis  Type 2 diabetes mellitus with hyperglycemia, without long-term current use of insulin ([place]) Blood sugars have been running higher will increase metformin to 1000 mg a.m. 500 mg p.m. and begin Trulicity 6.00 mcg weekly she was given a demonstration pen to see how that works and a prescription will be given under patient assistance  Contina was seen today for back pain.  Diagnoses and all orders for this visit:  Acute cystitis with hematuria -     Urinalysis -     Urine Culture  Type 2 diabetes mellitus with hyperglycemia, without long-term current use of insulin (HCC) -     metFORMIN (GLUCOPHAGE) 500 MG tablet; Take 2 tablets (1,000 mg total) by mouth daily with breakfast AND 1 tablet (500 mg total) daily with supper.  Other orders -     Vitamin D, Ergocalciferol, (DRISDOL) 1.25 MG (50000 UNIT) CAPS capsule; Take 1 capsule (50,000 Units total) by mouth every 7 (seven) days. -     Dulaglutide (TRULICITY) 4.59 XH/7.4FS SOPN; Inject 0.75 mg into the skin once a week. -     sulfamethoxazole-trimethoprim (BACTRIM DS) 800-160 MG tablet; Take 1 tablet by mouth 2 (two) times daily for 7 days.  38 minutes spent extra time needed for patient education Encouraged to get an eye  exam Plan follow-up in 3 months   Asencion Noble, MD

## 2022-05-18 NOTE — Patient Instructions (Signed)
Start Trulicity injection once weekly  Increase metformin to 2 in the morning with breakfast 1 in the evening with dinner  Refills on all medications sent to pharmacy  We asked urology to see you be looking for a phone call  Return to see Dr. Delford Field 2 months

## 2022-05-20 LAB — URINALYSIS
Bilirubin, UA: NEGATIVE
Glucose, UA: NEGATIVE
Ketones, UA: NEGATIVE
Leukocytes,UA: NEGATIVE
Nitrite, UA: NEGATIVE
RBC, UA: NEGATIVE
Specific Gravity, UA: 1.024 (ref 1.005–1.030)
Urobilinogen, Ur: 0.2 mg/dL (ref 0.2–1.0)
pH, UA: 5.5 (ref 5.0–7.5)

## 2022-05-20 LAB — URINE CULTURE: Organism ID, Bacteria: NO GROWTH

## 2022-05-21 NOTE — Progress Notes (Signed)
Let pt know urine clear and no infection  keep urology appt

## 2022-05-22 ENCOUNTER — Other Ambulatory Visit: Payer: Self-pay | Admitting: Critical Care Medicine

## 2022-05-22 ENCOUNTER — Telehealth: Payer: Self-pay

## 2022-05-22 DIAGNOSIS — E1165 Type 2 diabetes mellitus with hyperglycemia: Secondary | ICD-10-CM

## 2022-05-22 NOTE — Telephone Encounter (Signed)
-----   Message from Storm Frisk, MD sent at 05/21/2022  8:44 AM EST ----- Let pt know urine clear and no infection  keep urology appt

## 2022-05-22 NOTE — Telephone Encounter (Signed)
These are Trulicity side effects.  Stop medication immediately and make appt next weeks with [facility] clinical pharmacy

## 2022-05-22 NOTE — Telephone Encounter (Signed)
Pt was called and is aware of results, DOB was confirmed.  Interpreter id [phone]   Patient also stated that she has started Trulicity and is having vomiting and dizziness

## 2022-05-23 NOTE — Telephone Encounter (Signed)
Called patient and she is aware   What day and time can I put her on for luke ?

## 2022-05-24 ENCOUNTER — Ambulatory Visit: Payer: Self-pay | Admitting: Urology

## 2022-05-24 NOTE — Telephone Encounter (Signed)
D back and left vm about getting patient scheduled

## 2022-05-31 ENCOUNTER — Encounter: Payer: Self-pay | Admitting: Urology

## 2022-05-31 ENCOUNTER — Ambulatory Visit: Payer: Commercial Managed Care - HMO | Admitting: Urology

## 2022-05-31 VITALS — BP 135/82 | HR 88 | Ht 65.0 in | Wt 220.0 lb

## 2022-05-31 DIAGNOSIS — R3915 Urgency of urination: Secondary | ICD-10-CM | POA: Diagnosis not present

## 2022-05-31 DIAGNOSIS — R3129 Other microscopic hematuria: Secondary | ICD-10-CM

## 2022-05-31 DIAGNOSIS — R350 Frequency of micturition: Secondary | ICD-10-CM

## 2022-05-31 DIAGNOSIS — R300 Dysuria: Secondary | ICD-10-CM

## 2022-05-31 LAB — URINALYSIS, COMPLETE
Bilirubin, UA: NEGATIVE
Glucose, UA: NEGATIVE
Ketones, UA: NEGATIVE
Leukocytes,UA: NEGATIVE
Nitrite, UA: NEGATIVE
Protein,UA: NEGATIVE
Specific Gravity, UA: 1.015 (ref 1.005–1.030)
Urobilinogen, Ur: 0.2 mg/dL (ref 0.2–1.0)
pH, UA: 6.5 (ref 5.0–7.5)

## 2022-05-31 LAB — MICROSCOPIC EXAMINATION: Epithelial Cells (non renal): >10 /hpf — AB (ref 0–10)

## 2022-05-31 MED ORDER — URIBEL 118 MG PO CAPS: 118.0000 mg | ORAL_CAPSULE | ORAL | 1 refills | Status: DC | PRN

## 2022-05-31 NOTE — Progress Notes (Signed)
05/31/2022 8:53 AM   Victoria Schneider Nov 02, 1980 [id number]  Referring provider: Elsie Stain, MD [address] [place],  [place] [address]  Chief Complaint  Patient presents with   Hematuria    HPI: 41 y.o. female referred for microhematuria however she was seen June 2023 for intermediate risk microhematuria and had a negative renal ultrasound.  Cystoscopy performed July 2023 was unremarkable.  Since that visit she complains of intermittent episodes of frequency, urgency and pain at the end of urination Multiple UAs have shown positive blood on dipstick however microscopy's have not been ordered.  Urine cultures have been negative. A noncontrast CT of the abdomen pelvis was performed September 2023 which was unremarkable Denies gross hematuria Asymptomatic today   PMH: Past Medical History:  Diagnosis Date   Acute cystitis with hematuria 03/10/2021   Elevated LFTs 06/09/2020   GERD (gastroesophageal reflux disease)    Type 2 diabetes mellitus with hyperglycemia, without long-term current use of insulin ([place]) 05/05/2020    Surgical History: Past Surgical History:  Procedure Laterality Date   NO PAST SURGERIES      Home Medications:  Allergies as of 05/31/2022       Reactions   Penicillins Rash, Other (See Comments)   Has patient had a PCN reaction causing immediate rash, facial/tongue/throat swelling, SOB or lightheadedness with hypotension: No Has patient had a PCN reaction causing severe rash involving mucus membranes or skin necrosis: No Has patient had a PCN reaction that required hospitalization No Has patient had a PCN reaction occurring within the last 10 years: Yes If all of the above answers are "NO", then may proceed with Cephalosporin use.        Medication List        Accurate as of May 31, 2022  8:53 AM. If you have any questions, ask your nurse or doctor.          acetaminophen 325 MG tablet Commonly known as:  TYLENOL Take 650 mg by mouth every 6 (six) hours as needed.   cetirizine 10 MG tablet Commonly known as: ZYRTEC TAKE 1 TABLET BY MOUTH ONCE DAILY AS NEEDED FOR ALLERGIES   cyclobenzaprine 5 MG tablet Commonly known as: FLEXERIL Take 1 tablet (5 mg total) by mouth 3 (three) times daily as needed for muscle spasms.   ferrous sulfate 325 (65 FE) MG tablet Take 1 tablet (325 mg total) by mouth daily.   fluticasone 50 MCG/ACT nasal spray Commonly known as: FLONASE PLACE 2 SPRAYS INTO BOTH NOSTRILS DAILY.   ibuprofen 600 MG tablet Commonly known as: ADVIL Take 1 tablet (600 mg total) by mouth every 6 (six) hours as needed.   metFORMIN 500 MG tablet Commonly known as: GLUCOPHAGE Take 2 tablets (1,000 mg total) by mouth daily with breakfast AND 1 tablet (500 mg total) daily with supper.   True Metrix Blood Glucose Test test strip Generic drug: glucose blood USE AS DIRECTED   True Metrix Meter w/Device Kit 1 each by Does not apply route in the morning and at bedtime.   TRUEplus Lancets 28G Misc USE AS DIRECTED IN THE MORNING AND AT BEDTIME   Trulicity 3.90 ZE/0.9QZ Sopn Generic drug: Dulaglutide Inject 0.75 mg into the skin once a week.   Uribel 118 MG Caps Take 1 capsule (118 mg total) by mouth as needed. Started by: [name], MD   Vitamin D (Ergocalciferol) 1.25 MG (50000 UNIT) Caps capsule Commonly known as: DRISDOL Take 1 capsule (50,000  Units total) by mouth every 7 (seven) days.        Allergies:  Allergies  Allergen Reactions   Penicillins Rash and Other (See Comments)    Has patient had a PCN reaction causing immediate rash, facial/tongue/throat swelling, SOB or lightheadedness with hypotension: No Has patient had a PCN reaction causing severe rash involving mucus membranes or skin necrosis: No Has patient had a PCN reaction that required hospitalization No Has patient had a PCN reaction occurring within the last 10 years: Yes If all of the above  answers are "NO", then may proceed with Cephalosporin use.     Family History: Family History  Problem Relation Age of Onset   Diabetes Father    Hypertension Father    Diabetes Brother    Hypertension Mother    Depression Daughter     Social History:  reports that she has never smoked. She has never used smokeless tobacco. She reports that she does not currently use alcohol. She reports that she does not use drugs.   Physical Exam: BP 135/82   Pulse 88   Ht _0  (1.651 m)   Wt 220 lb (99.8 kg)   LMP 05/04/2022 (Exact Date)   BMI 36.61 kg/m   Constitutional:  Alert and oriented, No acute distress. HEENT: [place] AT Respiratory: Normal respiratory effort, no increased work of breathing. Psychiatric: Normal mood and affect.  Laboratory Data:  Urinalysis Dipstick 2+ blood Microscopy 3-10 RBC/> 10 epis   Pertinent Imaging: CT images were personally reviewed and interpreted  CT RENAL STONE STUDY  Narrative CLINICAL DATA:  Bilateral flank pain, acute cystitis with hematuria.  EXAM: CT ABDOMEN AND PELVIS WITHOUT CONTRAST  TECHNIQUE: Multidetector CT imaging of the abdomen and pelvis was performed following the standard protocol without IV contrast.  RADIATION DOSE REDUCTION: This exam was performed according to the departmental dose-optimization program which includes automated exposure control, adjustment of the mA and/or kV according to patient size and/or use of iterative reconstruction technique.  COMPARISON:  08/09/2016  FINDINGS: Lower chest: No acute abnormality.  Hepatobiliary: Limited assessment without IV contrast. Decreased hepatic attenuation compatible with hepatic steatosis. Trace fatty sparing along the gallbladder fossa. Gallbladder is collapsed. Common bile duct nondilated.  Pancreas: Unremarkable. No pancreatic ductal dilatation or surrounding inflammatory changes.  Spleen: Normal in size without focal abnormality.  Adrenals/Urinary  Tract: Adrenal glands are unremarkable. Kidneys are normal, without renal calculi, focal lesion, or hydronephrosis. Bladder is unremarkable.  Stomach/Bowel: Stomach is within normal limits. Appendix appears normal. No evidence of bowel wall thickening, distention, or inflammatory changes.  Vascular/Lymphatic: Limited without IV contrast. No aneurysm. No bulky adenopathy.  Reproductive: Uterus and bilateral adnexa are unremarkable.  Other: Chronic midline abdominal wall diastasis without definitive hernia. No free fluid or ascites.  Musculoskeletal: Minor lumbar degenerative changes and facet arthropathy posteriorly. No acute osseous finding.  IMPRESSION: No acute obstructing urinary tract or ureteral calculus. No acute obstructive uropathy or hydronephrosis.  Hepatic steatosis.  No other acute finding by noncontrast CT.   Electronically Signed By: Jerilynn Mages.  Shick M.D. On: 03/05/2022 11:58   Assessment & Plan:    1. Microhematuria She has had a recent renal ultrasound, CT abdomen/pelvis and cystoscopy and does not need further evaluation for microhematuria unless she has a significant increase in the amount of her hematuria or development of gross hematuria.  Previous UA at time of cystoscopy 12/2021 with 11-30 RBCs UA today with 3-10 RBCs however there were >10 epis indicating a contaminated specimen and  not valid We discussed the high false-negative rate of dipstick positive readings for hematuria and recommend sending UAs for microscopic examination if they show positive blood on dipstick  2.  Lower urinary tract symptoms Intermittent episodes of pain at the end of urination, frequency and urgency.  Urine cultures have been negative We discussed possibility of dietary triggers and she was provided literature on foods and beverages to avoid Rx Uribel 4 times daily as needed for symptom onset Recommend she contact our office for an acute visit if symptoms recur Follow-up visit 3  months   [name], MD  [facility] [address] [place], [place] [address] [phone]

## 2022-06-07 ENCOUNTER — Telehealth: Payer: Self-pay

## 2022-06-07 NOTE — Telephone Encounter (Signed)
Talked with [facility] about medication medication was as needed.

## 2022-06-07 NOTE — Telephone Encounter (Signed)
Pharmacist Maeola Harman on triage line stating that they need clarification on sig for Uribel. They cannot take PRN as a sig for this medication. Please call.

## 2022-06-20 NOTE — Progress Notes (Deleted)
Established Patient Office Visit  Subjective:  Patient ID: Victoria Schneider, female    DOB: December 28, 1980  Age: 42 y.o. MRN: [id number]    History of Present Illness:   CC:  Recurrent bladder pain and dysuria  HPI 02/2021 [name] presents for primary care follow-up visit last seen December 2021.  Patient has been noting periods of blood in the urine without dysuria.  She was seen previously this year in the emergency room for acute cystitis with hematuria.  Treated with a course of antibiotics for E. coli.  Patient does note excess menses with each menstrual period.  Patient does maintain metformin 1000 mg a.m. 500 mg p.m. for type 2 diabetes.  A1c had been controlled previously.  Blood sugar on arrival today 115 patient has also mild iron deficiency anemia from previous lab testing for which she is on iron supplementation. Patient does have mild vitamin D deficiency and is on vitamin D supplement.  Prior history of hepatic steatosis with elevated liver functions for which use of statin was held.  Patient still has significant obesity BMI of nearly 38.  Patient does state her bilateral wrist pain has been improved suspected to be due to carpal tunnel syndrome and doing well with wrist splints at night.  Patient also has hypersomnia with excess snoring.  She was not able to follow through with a referral to sleep medicine.  12/01/21 patient did not need interpreter This is a return visit from prior visit in September 2022.  In the interim the patient was seen in May for a bout of sinusitis from which she has resolved.  She does still have some ear popping and was given Zyrtec-D and Flonase by the PA.  Patient also had a visit in March was found to have hematuria and iron deficiency anemia referred to urology she has never been able to complete the appointment as she did not receive a phone call but they state they tried to call with no answer.  She is also had low back pain and neck  pain that is gotten worse and her weight is up to 215 pounds.  Note in March with her diabetes she was screened again has an A1c of 6.2 and is on the metformin at 1000 mg daily.  She had low vitamin D levels and was recommended to take vitamin D but she is not actually on any supplements at this time.  Today she needs a urine microalbumin assessed she needs a Pap smear foot exam and needs to get her eye exam performed  Patient still notes some blood in the urine and has occasional vaginal discharge.  There are no other complaints.  She is due a pneumonia vaccine she agrees to receive at this visit.   Below is assessment from the PA visit in March [place] 08/2021   1. Type 2 diabetes mellitus with hyperglycemia, without long-term current use of insulin (HCC) Controlled on the dose she is actually taking which I will send prescription she is currently taking it now - Glucose (CBG) - HgB A1c - POCT URINALYSIS DIP (CLINITEK) - Comprehensive metabolic panel - metFORMIN (GLUCOPHAGE) 500 MG tablet; 2 tabs daily with dinner  Dispense: 60 tablet; Refill: 3   2. Other microscopic hematuria No infection-increase water intake to 80-100 ounces water daily - POCT URINALYSIS DIP (CLINITEK) - Ambulatory referral to Urology   3. Dysfunction of both eustachian tubes Flonase sent - fluticasone (FLONASE) 50 MCG/ACT nasal spray; PLACE 2 SPRAYS INTO  BOTH NOSTRILS DAILY.  Dispense: 16 g; Refill: 6   4. Iron deficiency anemia due to chronic blood loss - ferrous sulfate 325 (65 FE) MG tablet; Take 1 tablet (325 mg total) by mouth daily.  Dispense: 100 tablet; Refill: 1 - CBC with Differential/Platelet   6. Allergy, subsequent encounter - cetirizine-pseudoephedrine (ZYRTEC-D) 5-120 MG tablet; Take 1 tablet by mouth 2 (two) times daily. prn  Dispense: 60 tablet; Refill: 1 - fluticasone (FLONASE) 50 MCG/ACT nasal spray; PLACE 2 SPRAYS INTO BOTH NOSTRILS DAILY.  Dispense: 16 g; Refill: 6   7. Vitamin D  deficiency - Vitamin D, 25-hydroxy   8. Chronic right-sided low back pain without sciatica - ibuprofen (ADVIL) 800 MG tablet; Take 1 tablet (800 mg total) by mouth 3 (three) times daily. Prn pain  Dispense: 30 tablet; Refill: 0 - methocarbamol (ROBAXIN) 500 MG tablet; Take 2 tablets (1,000 mg total) by mouth every 8 (eight) hours as needed for muscle spasms.  Dispense: 90 tablet; Refill: 0       The patient never received her Pap smear never got the visit with gynecology.  9/11 This patient is seen in return follow-up by way of a phone visit she has had continued flank pain bilaterally and was seen previously this year for blood in the urine by urology in [place].  Cystoscopy was done was normal repeat urinalysis showed blood in the urine but urine cultures were negative she had a visit here in our clinic recently had urine cultures done which showed multiple organisms which suggested repeat collection.  She is yet to have a CT renal study but she did have a renal ultrasound that was unremarkable did not show hydronephrosis.  Patient was not able to come to the office today and instead was set up for a virtual visit.  She still having flank pain.  She has been to urgent care more recently.  She was given Bactrim and this helped to some degree but her symptoms have recurred.  9/28 Patient returns from the last visit that was a phone visit and September of this month.  She still having low back pain but it is less.  She denies any blood in the urine at this time.  She is yet to achieve the lumbosacral spine x-ray. Urinalysis previously has shown some hematuria.  She had a CT renal study which was negative for stones or abnormalities in the kidneys.  She continues to eat a lot of rice tortillas in her diet.  She works Education administrator houses.  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  No other complaints.  11/30 Patient returns still having difficulty with dysuria and hematuria and had been to the emergency  room where she was evaluated had blood in the urine but urine culture showed multiple species contaminated.  Note she declines flu vaccine and blood pressure is good at this visit.  Patient does have lower abdominal pain with current symptom complex she was referred to urology but they declined to see her because she has [employer].  06/21/22   Went to GU in [place] in Dec 13 Microhematuria She has had a recent renal ultrasound, CT abdomen/pelvis and cystoscopy and does not need further evaluation for microhematuria unless she has a significant increase in the amount of her hematuria or development of gross hematuria.  Previous UA at time of cystoscopy 12/2021 with 11-30 RBCs UA today with 3-10 RBCs however there were >10 epis indicating a contaminated specimen and not valid We discussed the  high false-negative rate of dipstick positive readings for hematuria and recommend sending UAs for microscopic examination if they show positive blood on dipstick   2.  Lower urinary tract symptoms Intermittent episodes of pain at the end of urination, frequency and urgency.  Urine cultures have been negative We discussed possibility of dietary triggers and she was provided literature on foods and beverages to avoid Rx Uribel 4 times daily as needed for symptom onset Recommend she contact our office for an acute visit if symptoms recur Follow-up visit 3 months     [name], MD  Acute cystitis with hematuria Recurrent acute cystitis with chronic hematuria will need urology referral we were able to get her in with the fellows faculty practice at [facility] urology for evaluation as she cannot get into the [facility] practice it does not accept her insurance  Will give a course of Bactrim double strength twice daily for 7 days  Will obtain another urine culture and urinalysis  Type 2 diabetes mellitus with hyperglycemia, without long-term current use of insulin ([place]) Blood sugars have been  running higher will increase metformin to 1000 mg a.m. 500 mg p.m. and begin Trulicity 7.34 mcg weekly she was given a demonstration pen to see how that works and a prescription will be given under patient assistance  Theresia was seen today for back pain.  Diagnoses and all orders for this visit:  Acute cystitis with hematuria -     Urinalysis -     Urine Culture  Type 2 diabetes mellitus with hyperglycemia, without long-term current use of insulin (HCC) -     metFORMIN (GLUCOPHAGE) 500 MG tablet; Take 2 tablets (1,000 mg total) by mouth daily with breakfast AND 1 tablet (500 mg total) daily with supper.  Past Medical History:  Diagnosis Date   Acute cystitis with hematuria 03/10/2021   Elevated LFTs 06/09/2020   GERD (gastroesophageal reflux disease)    Type 2 diabetes mellitus with hyperglycemia, without long-term current use of insulin ([place]) 05/05/2020    Past Surgical History:  Procedure Laterality Date   NO PAST SURGERIES      Family History  Problem Relation Age of Onset   Diabetes Father    Hypertension Father    Diabetes Brother    Hypertension Mother    Depression Daughter     Social History   Socioeconomic History   Marital status: Single    Spouse name: Not on file   Number of children: Not on file   Years of education: Not on file   Highest education level: Not on file  Occupational History   Not on file  Tobacco Use   Smoking status: Never   Smokeless tobacco: Never  Vaping Use   Vaping Use: Never used  Substance and Sexual Activity   Alcohol use: Not Currently    Comment: rarely   Drug use: No   Sexual activity: Yes    Birth control/protection: None  Other Topics Concern   Not on file  Social History Narrative   Not on file   Social Determinants of Health   Financial Resource Strain: Not on file  Food Insecurity: Not on file  Transportation Needs: Not on file  Physical Activity: Not on file  Stress: Not on file  Social Connections: Not on  file  Intimate Partner Violence: Not on file    Outpatient Medications Prior to Visit  Medication Sig Dispense Refill   acetaminophen (TYLENOL) 325 MG tablet Take 650 mg by mouth  every 6 (six) hours as needed.     Blood Glucose Monitoring Suppl (TRUE METRIX METER) w/Device KIT 1 each by Does not apply route in the morning and at bedtime. 1 kit 0   cetirizine (ZYRTEC) 10 MG tablet TAKE 1 TABLET BY MOUTH ONCE DAILY AS NEEDED FOR ALLERGIES 30 tablet 3   cyclobenzaprine (FLEXERIL) 5 MG tablet Take 1 tablet (5 mg total) by mouth 3 (three) times daily as needed for muscle spasms. 60 tablet 1   Dulaglutide (TRULICITY) 5.97 CB/6.3AG SOPN Inject 0.75 mg into the skin once a week. 2 mL 4   ferrous sulfate 325 (65 FE) MG tablet Take 1 tablet (325 mg total) by mouth daily. 100 tablet 1   fluticasone (FLONASE) 50 MCG/ACT nasal spray PLACE 2 SPRAYS INTO BOTH NOSTRILS DAILY. 16 g 6   glucose blood (TRUE METRIX BLOOD GLUCOSE TEST) test strip USE AS DIRECTED 100 each 3   ibuprofen (ADVIL) 600 MG tablet Take 1 tablet (600 mg total) by mouth every 6 (six) hours as needed. 30 tablet 0   metFORMIN (GLUCOPHAGE) 500 MG tablet Take 2 tablets (1,000 mg total) by mouth daily with breakfast AND 1 tablet (500 mg total) daily with supper. 180 tablet 3   Meth-Hyo-M Bl-Na Phos-Ph Sal (URIBEL) 118 MG CAPS Take 1 capsule (118 mg total) by mouth as needed. 30 capsule 1   TRUEplus Lancets 28G MISC USE AS DIRECTED IN THE MORNING AND AT BEDTIME 100 each 2   Vitamin D, Ergocalciferol, (DRISDOL) 1.25 MG (50000 UNIT) CAPS capsule Take 1 capsule (50,000 Units total) by mouth every 7 (seven) days. 16 capsule 3   No facility-administered medications prior to visit.    Allergies  Allergen Reactions   Penicillins Rash and Other (See Comments)    Has patient had a PCN reaction causing immediate rash, facial/tongue/throat swelling, SOB or lightheadedness with hypotension: No Has patient had a PCN reaction causing severe rash involving  mucus membranes or skin necrosis: No Has patient had a PCN reaction that required hospitalization No Has patient had a PCN reaction occurring within the last 10 years: Yes If all of the above answers are "NO", then may proceed with Cephalosporin use.     ROS Review of Systems  Constitutional:  Negative for chills, diaphoresis and fever.  HENT:  Negative for congestion, hearing loss, nosebleeds, sore throat and tinnitus.   Eyes:  Negative for photophobia and redness.  Respiratory:  Negative for cough, shortness of breath, wheezing and stridor.   Cardiovascular:  Negative for chest pain, palpitations and leg swelling.  Gastrointestinal:  Negative for abdominal pain, blood in stool, constipation, diarrhea, nausea and vomiting.  Endocrine: Negative for polydipsia.  Genitourinary:  Positive for dysuria, hematuria, pelvic pain and urgency. Negative for flank pain, frequency and menstrual problem.  Musculoskeletal:  Positive for back pain. Negative for myalgias and neck pain.  Skin:  Negative for rash.  Allergic/Immunologic: Negative for environmental allergies.  Neurological:  Negative for dizziness, tremors, seizures, weakness and headaches.  Hematological:  Does not bruise/bleed easily.  Psychiatric/Behavioral:  Negative for suicidal ideas. The patient is not nervous/anxious.       Objective:    There were no vitals filed for this visit.   Gen: Pleasant, well-nourished, in no distress,  normal affect  ENT: No lesions,  mouth clear,  oropharynx clear, no postnasal drip  Neck: No JVD, no TMG, no carotid bruits  Lungs: No use of accessory muscles, no dullness to percussion, clear without rales or  rhonchi  Cardiovascular: RRR, heart sounds normal, no murmur or gallops, no peripheral edema  Abdomen: Suprapubic tenderness no HSM,  BS normal  Musculoskeletal: No deformities, no cyanosis or clubbing  Neuro: alert, non focal  Skin: Warm, no lesions or rashes  No results  found.   There were no vitals taken for this visit. Wt Readings from Last 3 Encounters:  05/31/22 220 lb (99.8 kg)  05/18/22 220 lb 3.2 oz (99.9 kg)  03/16/22 224 lb 3.2 oz (101.7 kg)   There were no vitals filed for this visit.   Gen: Pleasant, well-nourished, in no distress,  normal affect  ENT: No lesions,  mouth clear,  oropharynx clear, no postnasal drip  Neck: No JVD, no TMG, no carotid bruits  Lungs: No use of accessory muscles, no dullness to percussion, clear without rales or rhonchi  Cardiovascular: RRR, heart sounds normal, no murmur or gallops, no peripheral edema  Abdomen: soft and NT, no HSM,  BS normal  Musculoskeletal: No deformities, no cyanosis or clubbing, muscles and lumbar spine paraspinal are spastic and tender  Neuro: alert, non focal  Skin: Warm, no lesions or rashes  No results found.   Health Maintenance Due  Topic Date Due   OPHTHALMOLOGY EXAM  Never done    There are no preventive care reminders to display for this patient.  Lab Results  Component Value Date   TSH 2.670 10/14/2020   Lab Results  Component Value Date   WBC 7.8 02/28/2022   HGB 12.3 02/28/2022   HCT 38.8 02/28/2022   MCV 78 (L) 02/28/2022   PLT 344 02/28/2022   Lab Results  Component Value Date   NA 141 02/28/2022   K 4.2 02/28/2022   CO2 26 02/28/2022   GLUCOSE 115 (H) 02/28/2022   BUN 7 02/28/2022   CREATININE 0.69 02/28/2022   BILITOT 0.6 08/25/2021   ALKPHOS 141 (H) 08/25/2021   AST 22 08/25/2021   ALT 26 08/25/2021   PROT 7.4 08/25/2021   ALBUMIN 4.3 08/25/2021   CALCIUM 9.7 02/28/2022   ANIONGAP 9 07/22/2017   EGFR 111 02/28/2022   Lab Results  Component Value Date   CHOL 175 05/26/2020   Lab Results  Component Value Date   HDL 56 05/26/2020   Lab Results  Component Value Date   LDLCALC 102 (H) 05/26/2020   Lab Results  Component Value Date   TRIG 93 05/26/2020   Lab Results  Component Value Date   CHOLHDL 3.1 05/26/2020   Lab  Results  Component Value Date   HGBA1C 6.9 (H) 02/28/2022      Assessment & Plan:   Problem List Items Addressed This Visit    I personally reviewed all images and lab data in the [facility] system as well as any outside material available during this office visit and agree with the  radiology impressions.   No problem-specific Assessment & Plan notes found for this encounter.  There are no diagnoses linked to this encounter. 38 minutes spent extra time needed for patient education Encouraged to get an eye exam Plan follow-up in 3 months   Asencion Noble, MD

## 2022-06-21 ENCOUNTER — Ambulatory Visit: Payer: Commercial Managed Care - HMO | Admitting: Critical Care Medicine

## 2022-08-17 ENCOUNTER — Other Ambulatory Visit: Payer: Self-pay

## 2022-08-31 ENCOUNTER — Ambulatory Visit: Payer: Commercial Managed Care - HMO | Admitting: Physician Assistant

## 2022-09-03 NOTE — Progress Notes (Unsigned)
Established Patient Office Visit  Subjective:  Patient ID: ROSALIA WESTOVER, female    DOB: Jul 10, 1980  Age: 42 y.o. MRN: [id number]    History of Present Illness:   Chief Complaint  Patient presents with   Diabetes   New Med Request    Patient request ibuprofen 800 mg for period cramps       HPI 02/2021 NYEMAH ERRIGO presents for primary care follow-up visit last seen December 2021.  Patient has been noting periods of blood in the urine without dysuria.  She was seen previously this year in the emergency room for acute cystitis with hematuria.  Treated with a course of antibiotics for E. coli.  Patient does note excess menses with each menstrual period.  Patient does maintain metformin 1000 mg a.m. 500 mg p.m. for type 2 diabetes.  A1c had been controlled previously.  Blood sugar on arrival today 115 patient has also mild iron deficiency anemia from previous lab testing for which she is on iron supplementation. Patient does have mild vitamin D deficiency and is on vitamin D supplement.  Prior history of hepatic steatosis with elevated liver functions for which use of statin was held.  Patient still has significant obesity BMI of nearly 38.  Patient does state her bilateral wrist pain has been improved suspected to be due to carpal tunnel syndrome and doing well with wrist splints at night.  Patient also has hypersomnia with excess snoring.  She was not able to follow through with a referral to sleep medicine.  12/01/21 patient did not need interpreter This is a return visit from prior visit in September 2022.  In the interim the patient was seen in May for a bout of sinusitis from which she has resolved.  She does still have some ear popping and was given Zyrtec-D and Flonase by the PA.  Patient also had a visit in March was found to have hematuria and iron deficiency anemia referred to urology she has never been able to complete the appointment as she did not receive a phone  call but they state they tried to call with no answer.  She is also had low back pain and neck pain that is gotten worse and her weight is up to 215 pounds.  Note in March with her diabetes she was screened again has an A1c of 6.2 and is on the metformin at 1000 mg daily.  She had low vitamin D levels and was recommended to take vitamin D but she is not actually on any supplements at this time.  Today she needs a urine microalbumin assessed she needs a Pap smear foot exam and needs to get her eye exam performed  Patient still notes some blood in the urine and has occasional vaginal discharge.  There are no other complaints.  She is due a pneumonia vaccine she agrees to receive at this visit.   Below is assessment from the PA visit in March [place] 08/2021   1. Type 2 diabetes mellitus with hyperglycemia, without long-term current use of insulin (HCC) Controlled on the dose she is actually taking which I will send prescription she is currently taking it now - Glucose (CBG) - HgB A1c - POCT URINALYSIS DIP (CLINITEK) - Comprehensive metabolic panel - metFORMIN (GLUCOPHAGE) 500 MG tablet; 2 tabs daily with dinner  Dispense: 60 tablet; Refill: 3   2. Other microscopic hematuria No infection-increase water intake to 80-100 ounces water daily - POCT URINALYSIS DIP (CLINITEK) - Ambulatory referral  to Urology   3. Dysfunction of both eustachian tubes Flonase sent - fluticasone (FLONASE) 50 MCG/ACT nasal spray; PLACE 2 SPRAYS INTO BOTH NOSTRILS DAILY.  Dispense: 16 g; Refill: 6   4. Iron deficiency anemia due to chronic blood loss - ferrous sulfate 325 (65 FE) MG tablet; Take 1 tablet (325 mg total) by mouth daily.  Dispense: 100 tablet; Refill: 1 - CBC with Differential/Platelet   6. Allergy, subsequent encounter - cetirizine-pseudoephedrine (ZYRTEC-D) 5-120 MG tablet; Take 1 tablet by mouth 2 (two) times daily. prn  Dispense: 60 tablet; Refill: 1 - fluticasone (FLONASE) 50 MCG/ACT nasal spray;  PLACE 2 SPRAYS INTO BOTH NOSTRILS DAILY.  Dispense: 16 g; Refill: 6   7. Vitamin D deficiency - Vitamin D, 25-hydroxy   8. Chronic right-sided low back pain without sciatica - ibuprofen (ADVIL) 800 MG tablet; Take 1 tablet (800 mg total) by mouth 3 (three) times daily. Prn pain  Dispense: 30 tablet; Refill: 0 - methocarbamol (ROBAXIN) 500 MG tablet; Take 2 tablets (1,000 mg total) by mouth every 8 (eight) hours as needed for muscle spasms.  Dispense: 90 tablet; Refill: 0       The patient never received her Pap smear never got the visit with gynecology.  9/11 This patient is seen in return follow-up by way of a phone visit she has had continued flank pain bilaterally and was seen previously this year for blood in the urine by urology in [place].  Cystoscopy was done was normal repeat urinalysis showed blood in the urine but urine cultures were negative she had a visit here in our clinic recently had urine cultures done which showed multiple organisms which suggested repeat collection.  She is yet to have a CT renal study but she did have a renal ultrasound that was unremarkable did not show hydronephrosis.  Patient was not able to come to the office today and instead was set up for a virtual visit.  She still having flank pain.  She has been to urgent care more recently.  She was given Bactrim and this helped to some degree but her symptoms have recurred.  9/28 Patient returns from the last visit that was a phone visit and September of this month.  She still having low back pain but it is less.  She denies any blood in the urine at this time.  She is yet to achieve the lumbosacral spine x-ray. Urinalysis previously has shown some hematuria.  She had a CT renal study which was negative for stones or abnormalities in the kidneys.  She continues to eat a lot of rice tortillas in her diet.  She works Education administrator houses.  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  No other complaints.  11/30 Patient  returns still having difficulty with dysuria and hematuria and had been to the emergency room where she was evaluated had blood in the urine but urine culture showed multiple species contaminated.  Note she declines flu vaccine and blood pressure is good at this visit.  Patient does have lower abdominal pain with current symptom complex she was referred to urology but they declined to see her because she has [place] insurance  09/05/22 Patient seen in return follow-up from prior visit in November of last year.  On arrival A1c is 7 blood sugar 174.  She was not able to tolerate Trulicity is now off this.  She does maintain metformin daily.  She would like refill on her high-dose ibuprofen for menstrual cramps.  She did  have an [facility] exam 6 months ago at the [facility] and will get need to get records.  Last menstrual period 2 weeks ago.  She is not interested in birth control medication.  She states the menstrual cramps are mild.  Patient did have cystitis and hematuria she saw urology they did not feel like anything further was needed other than institution of Uribel taking as needed the patient would like refill on ibuprofen   below is documentation from last urology visit in December . Microhematuria She has had a recent renal ultrasound, CT abdomen/pelvis and cystoscopy and does not need further evaluation for microhematuria unless she has a significant increase in the amount of her hematuria or development of gross hematuria.  Previous UA at time of cystoscopy 12/2021 with 11-30 RBCs UA today with 3-10 RBCs however there were >10 epis indicating a contaminated specimen and not valid We discussed the high false-negative rate of dipstick positive readings for hematuria and recommend sending UAs for microscopic examination if they show positive blood on dipstick   2.  Lower urinary tract symptoms Intermittent episodes of pain at the end of urination, frequency and urgency.  Urine cultures have been  negative We discussed possibility of dietary triggers and she was provided literature on foods and beverages to avoid Rx Uribel 4 times daily as needed for symptom onset Recommend she contact our office for an acute visit if symptoms recur Follow-up visit 3 months     Past Medical History:  Diagnosis Date   Acute cystitis with hematuria 03/10/2021   Acute cystitis with hematuria 03/10/2021   Elevated LFTs 06/09/2020   GERD (gastroesophageal reflux disease)    Type 2 diabetes mellitus with hyperglycemia, without long-term current use of insulin ([place]) 05/05/2020    Past Surgical History:  Procedure Laterality Date   NO PAST SURGERIES      Family History  Problem Relation Age of Onset   Diabetes Father    Hypertension Father    Diabetes Brother    Hypertension Mother    Depression Daughter     Social History   Socioeconomic History   Marital status: Single    Spouse name: Not on file   Number of children: Not on file   Years of education: Not on file   Highest education level: Not on file  Occupational History   Not on file  Tobacco Use   Smoking status: Never   Smokeless tobacco: Never  Vaping Use   Vaping Use: Never used  Substance and Sexual Activity   Alcohol use: Not Currently    Comment: rarely   Drug use: No   Sexual activity: Yes    Birth control/protection: None  Other Topics Concern   Not on file  Social History Narrative   Not on file   Social Determinants of Health   Financial Resource Strain: Not on file  Food Insecurity: Not on file  Transportation Needs: Not on file  Physical Activity: Not on file  Stress: Not on file  Social Connections: Not on file  Intimate Partner Violence: Not on file    Outpatient Medications Prior to Visit  Medication Sig Dispense Refill   acetaminophen (TYLENOL) 325 MG tablet Take 650 mg by mouth every 6 (six) hours as needed.     Blood Glucose Monitoring Suppl (TRUE METRIX METER) w/Device KIT 1 each by Does  not apply route in the morning and at bedtime. 1 kit 0   cetirizine (ZYRTEC) 10 MG tablet TAKE  1 TABLET BY MOUTH ONCE DAILY AS NEEDED FOR ALLERGIES 30 tablet 3   ferrous sulfate 325 (65 FE) MG tablet Take 1 tablet (325 mg total) by mouth daily. 100 tablet 1   fluticasone (FLONASE) 50 MCG/ACT nasal spray PLACE 2 SPRAYS INTO BOTH NOSTRILS DAILY. 16 g 6   glucose blood (TRUE METRIX BLOOD GLUCOSE TEST) test strip USE AS DIRECTED 100 each 3   TRUEplus Lancets 28G MISC USE AS DIRECTED IN THE MORNING AND AT BEDTIME 100 each 2   cyclobenzaprine (FLEXERIL) 5 MG tablet Take 1 tablet (5 mg total) by mouth 3 (three) times daily as needed for muscle spasms. 60 tablet 1   Dulaglutide (TRULICITY) A999333 0000000 SOPN Inject 0.75 mg into the skin once a week. 2 mL 4   ibuprofen (ADVIL) 600 MG tablet Take 1 tablet (600 mg total) by mouth every 6 (six) hours as needed. 30 tablet 0   metFORMIN (GLUCOPHAGE) 500 MG tablet Take 2 tablets (1,000 mg total) by mouth daily with breakfast AND 1 tablet (500 mg total) daily with supper. 180 tablet 3   Meth-Hyo-M Bl-Na Phos-Ph Sal (URIBEL) 118 MG CAPS Take 1 capsule (118 mg total) by mouth as needed. 30 capsule 1   Vitamin D, Ergocalciferol, (DRISDOL) 1.25 MG (50000 UNIT) CAPS capsule Take 1 capsule (50,000 Units total) by mouth every 7 (seven) days. 16 capsule 3   No facility-administered medications prior to visit.    Allergies  Allergen Reactions   Trulicity [Dulaglutide] Nausea And Vomiting   Penicillins Rash and Other (See Comments)    Has patient had a PCN reaction causing immediate rash, facial/tongue/throat swelling, SOB or lightheadedness with hypotension: No Has patient had a PCN reaction causing severe rash involving mucus membranes or skin necrosis: No Has patient had a PCN reaction that required hospitalization No Has patient had a PCN reaction occurring within the last 10 years: Yes If all of the above answers are "NO", then may proceed with Cephalosporin  use.     ROS Review of Systems  Constitutional:  Negative for chills, diaphoresis and fever.  HENT:  Negative for congestion, hearing loss, nosebleeds, sore throat and tinnitus.   Eyes:  Negative for photophobia and redness.  Respiratory:  Negative for cough, shortness of breath, wheezing and stridor.   Cardiovascular:  Negative for chest pain, palpitations and leg swelling.  Gastrointestinal:  Negative for abdominal pain, blood in stool, constipation, diarrhea, nausea and vomiting.  Endocrine: Negative for polydipsia.  Genitourinary:  Negative for dysuria, flank pain, frequency, hematuria, menstrual problem, pelvic pain and urgency.  Musculoskeletal:  Positive for back pain. Negative for myalgias and neck pain.  Skin:  Negative for rash.  Allergic/Immunologic: Negative for environmental allergies.  Neurological:  Negative for dizziness, tremors, seizures, weakness and headaches.  Hematological:  Does not bruise/bleed easily.  Psychiatric/Behavioral:  Negative for suicidal ideas. The patient is not nervous/anxious.       Objective:    Vitals:   09/05/22 1059  BP: 115/81  Pulse: 80  SpO2: 96%  Weight: 223 lb 6.4 oz (101.3 kg)  Height: 5\' 4"  (1.626 m)    Gen: Pleasant, well-nourished, in no distress,  normal affect  ENT: No lesions,  mouth clear,  oropharynx clear, no postnasal drip  Neck: No JVD, no TMG, no carotid bruits  Lungs: No use of accessory muscles, no dullness to percussion, clear without rales or rhonchi  Cardiovascular: RRR, heart sounds normal, no murmur or gallops, no peripheral edema  Abdomen: Suprapubic tenderness  no HSM,  BS normal  Musculoskeletal: No deformities, no cyanosis or clubbing  Neuro: alert, non focal  Skin: Warm, no lesions or rashes  No results found.   BP 115/81 (BP Location: Right Arm, Patient Position: Sitting, Cuff Size: Large)   Pulse 80   Ht 5\' 4"  (1.626 m)   Wt 223 lb 6.4 oz (101.3 kg)   SpO2 96%   BMI 38.35 kg/m  Wt  Readings from Last 3 Encounters:  09/05/22 223 lb 6.4 oz (101.3 kg)  05/31/22 220 lb (99.8 kg)  05/18/22 220 lb 3.2 oz (99.9 kg)   Vitals:   09/05/22 1059  BP: 115/81  Pulse: 80  SpO2: 96%  Weight: 223 lb 6.4 oz (101.3 kg)  Height: 5\' 4"  (1.626 m)    Gen: Pleasant, well-nourished, in no distress,  normal affect  ENT: No lesions,  mouth clear,  oropharynx clear, no postnasal drip  Neck: No JVD, no TMG, no carotid bruits  Lungs: No use of accessory muscles, no dullness to percussion, clear without rales or rhonchi  Cardiovascular: RRR, heart sounds normal, no murmur or gallops, no peripheral edema  Abdomen: soft and NT, no HSM,  BS normal  Musculoskeletal: No deformities, no cyanosis or clubbing, muscles and lumbar spine paraspinal are spastic and tender  Neuro: alert, non focal  Skin: Warm, no lesions or rashes  No results found.   Health Maintenance Due  Topic Date Due   OPHTHALMOLOGY EXAM  Never done    There are no preventive care reminders to display for this patient.  Lab Results  Component Value Date   TSH 2.670 10/14/2020   Lab Results  Component Value Date   WBC 7.8 02/28/2022   HGB 12.3 02/28/2022   HCT 38.8 02/28/2022   MCV 78 (L) 02/28/2022   PLT 344 02/28/2022   Lab Results  Component Value Date   NA 141 02/28/2022   K 4.2 02/28/2022   CO2 26 02/28/2022   GLUCOSE 115 (H) 02/28/2022   BUN 7 02/28/2022   CREATININE 0.69 02/28/2022   BILITOT 0.6 08/25/2021   ALKPHOS 141 (H) 08/25/2021   AST 22 08/25/2021   ALT 26 08/25/2021   PROT 7.4 08/25/2021   ALBUMIN 4.3 08/25/2021   CALCIUM 9.7 02/28/2022   ANIONGAP 9 07/22/2017   EGFR 111 02/28/2022   Lab Results  Component Value Date   CHOL 175 05/26/2020   Lab Results  Component Value Date   HDL 56 05/26/2020   Lab Results  Component Value Date   LDLCALC 102 (H) 05/26/2020   Lab Results  Component Value Date   TRIG 93 05/26/2020   Lab Results  Component Value Date   CHOLHDL 3.1  05/26/2020   Lab Results  Component Value Date   HGBA1C 7.0 09/05/2022      Assessment & Plan:   Problem List Items Addressed This Visit    I personally reviewed all images and lab data in the [facility] system as well as any outside material available during this office visit and agree with the  radiology impressions.   Acute cystitis with hematuria Resolved  Type 2 diabetes mellitus with hyperglycemia, without long-term current use of insulin (HCC) Close to goal will need to add another medication will begin Farxiga low-dose 5 mg daily continue metformin as prescribed  Menorrhagia with regular cycle Patient declines gynecology referral at this time will give ibuprofen for menstrual cramps  Vitamin D deficiency Maintain vitamin D  Lumbar back pain Refill ibuprofen  Iron deficiency anemia due to chronic blood loss Continue oral iron  BMI 37.0-37.9, adult The following Lifestyle Medicine recommendations according to [place] of Lifestyle Medicine [facility]) were discussed and offered to patient who agrees to start the journey:  A. Whole Foods, Plant-based plate comprising of fruits and vegetables, plant-based proteins, whole-grain carbohydrates was discussed in detail with the patient.   A list for source of those nutrients were also provided to the patient.  Patient will use only water or unsweetened tea for hydration. B.  The need to stay away from risky substances including alcohol, smoking; obtaining 7 to 9 hours of restorative sleep, at least 150 minutes of moderate intensity exercise weekly, the importance of healthy social connections,  and stress reduction techniques were discussed. C.  A full color page of  Calorie density of various food groups per pound showing examples of each food groups was provided to the patient.   Arilyn was seen today for diabetes and new med request.  Diagnoses and all orders for this visit:  Type 2 diabetes mellitus with hyperglycemia, without  long-term current use of insulin (HCC) -     HgB A1c -     POCT glucose (manual entry) -     Lipid panel -     metFORMIN (GLUCOPHAGE) 500 MG tablet; Take 2 tablets (1,000 mg total) by mouth daily with breakfast AND 1 tablet (500 mg total) daily with supper.  Acute cystitis with hematuria  Menorrhagia with regular cycle  Vitamin D deficiency  Lumbar back pain  Iron deficiency anemia due to chronic blood loss  BMI 37.0-37.9, adult  Other orders -     dapagliflozin propanediol (FARXIGA) 5 MG TABS tablet; Take 1 tablet (5 mg total) by mouth daily. -     ibuprofen (ADVIL) 600 MG tablet; Take 1 tablet (600 mg total) by mouth every 6 (six) hours as needed. -     Vitamin D, Ergocalciferol, (DRISDOL) 1.25 MG (50000 UNIT) CAPS capsule; Take 1 capsule (50,000 Units total) by mouth every 7 (seven) days. -     Meth-Hyo-M Bl-Na Phos-Ph Sal (URIBEL) 118 MG CAPS; Take 1 capsule (118 mg total) by mouth as needed.   Return follow-up 4 months for diabetes Asencion Noble, MD

## 2022-09-05 ENCOUNTER — Encounter: Payer: Self-pay | Admitting: Critical Care Medicine

## 2022-09-05 ENCOUNTER — Ambulatory Visit: Payer: Commercial Managed Care - HMO | Attending: Critical Care Medicine | Admitting: Critical Care Medicine

## 2022-09-05 VITALS — BP 115/81 | HR 80 | Ht 64.0 in | Wt 223.4 lb

## 2022-09-05 DIAGNOSIS — N920 Excessive and frequent menstruation with regular cycle: Secondary | ICD-10-CM

## 2022-09-05 DIAGNOSIS — E559 Vitamin D deficiency, unspecified: Secondary | ICD-10-CM | POA: Diagnosis not present

## 2022-09-05 DIAGNOSIS — D500 Iron deficiency anemia secondary to blood loss (chronic): Secondary | ICD-10-CM

## 2022-09-05 DIAGNOSIS — E1165 Type 2 diabetes mellitus with hyperglycemia: Secondary | ICD-10-CM

## 2022-09-05 DIAGNOSIS — N3001 Acute cystitis with hematuria: Secondary | ICD-10-CM | POA: Diagnosis not present

## 2022-09-05 DIAGNOSIS — Z6837 Body mass index (BMI) 37.0-37.9, adult: Secondary | ICD-10-CM

## 2022-09-05 DIAGNOSIS — M545 Low back pain, unspecified: Secondary | ICD-10-CM

## 2022-09-05 LAB — POCT GLYCOSYLATED HEMOGLOBIN (HGB A1C): HbA1c, POC (controlled diabetic range): 7 % (ref 0.0–7.0)

## 2022-09-05 LAB — GLUCOSE, POCT (MANUAL RESULT ENTRY): POC Glucose: 174 mg/dl — AB (ref 70–99)

## 2022-09-05 MED ORDER — METFORMIN HCL 500 MG PO TABS: ORAL_TABLET | ORAL | 3 refills | Status: DC

## 2022-09-05 MED ORDER — URIBEL 118 MG PO CAPS: 118.0000 mg | ORAL_CAPSULE | ORAL | 1 refills | Status: DC | PRN

## 2022-09-05 MED ORDER — IBUPROFEN 600 MG PO TABS: 600.0000 mg | ORAL_TABLET | Freq: Four times a day (QID) | ORAL | 1 refills | Status: DC | PRN

## 2022-09-05 MED ORDER — VITAMIN D (ERGOCALCIFEROL) 1.25 MG (50000 UNIT) PO CAPS: 50000.0000 [IU] | ORAL_CAPSULE | ORAL | 3 refills | Status: DC

## 2022-09-05 MED ORDER — DAPAGLIFLOZIN PROPANEDIOL 5 MG PO TABS: 5.0000 mg | ORAL_TABLET | Freq: Every day | ORAL | 5 refills | Status: DC

## 2022-09-05 NOTE — Assessment & Plan Note (Signed)
Patient declines gynecology referral at this time will give ibuprofen for menstrual cramps

## 2022-09-05 NOTE — Assessment & Plan Note (Signed)
-   Refill ibuprofen

## 2022-09-05 NOTE — Assessment & Plan Note (Signed)

## 2022-09-05 NOTE — Assessment & Plan Note (Signed)
-  Continue oral iron

## 2022-09-05 NOTE — Assessment & Plan Note (Signed)
Maintain vitamin D

## 2022-09-05 NOTE — Assessment & Plan Note (Signed)
Resolved

## 2022-09-05 NOTE — Assessment & Plan Note (Signed)
Close to goal will need to add another medication will begin Farxiga low-dose 5 mg daily continue metformin as prescribed

## 2022-09-05 NOTE — Patient Instructions (Addendum)
Ibuprofen and other medications refilled   Labs cholesterol  Follow life style medicine handout  Start [place] one daily for diabetes Stay on metformin         Advice for Weight Management   -For most of [place] the best way to lose weight is by diet management. Generally speaking, diet management means consuming less calories intentionally which over time brings about progressive weight loss.  This can be achieved more effectively by avoiding ultra processed carbohydrates, processed meats, unhealthy fats.    It is critically important to know your numbers: how much calorie you are consuming and how much calorie you need. More importantly, our carbohydrates sources should be unprocessed naturally occurring  complex starch food items.  It is always important to balance nutrition also by  appropriate intake of proteins (mainly plant-based), healthy fats/oils, plenty of fruits and vegetables.    -The American College of Lifestyle Medicine (ACL M) recommends nutrition derived mostly from Whole Food, Plant Predominant Sources example an apple instead of applesauce or apple pie. Eat Plenty of vegetables, Mushrooms, fruits, Legumes, Whole Grains, Nuts, seeds in lieu of processed meats, processed snacks/pastries red meat, poultry, eggs.  Use only water or unsweetened tea for hydration.  The College also recommends the need to stay away from risky substances including alcohol, smoking; obtaining 7-9 hours of restorative sleep, at least 150 minutes of moderate intensity exercise weekly, importance of healthy social connections, and being mindful of stress and seek help when it is overwhelming.     -Sticking to a routine mealtime to eat 3 meals a day and avoiding unnecessary snacks is shown to have a big role in weight control. Under normal circumstances, the only time we burn stored energy is when we are hungry, so allow  some hunger to take place- hunger means no food between appropriate meal times, only water.   It is not advisable to starve.    -It is better to avoid simple carbohydrates including: Cakes, Sweet Desserts, Ice Cream, Soda (diet and regular), Sweet Tea, Candies, Chips, Cookies, Store Bought Juices, Alcohol in Excess of  1-2 drinks a day, Lemonade,  Artificial Sweeteners, Doughnuts, Coffee Creamers, "Sugar-free" Products, etc, etc.  This is not a complete list...Marland Kitchen.    -Consulting with certified diabetes educators is proven to provide you with the most accurate and current information on diet.  Also, you may be  interested in discussing diet options/exchanges , we can schedule a visit with Jearld Fenton, RDN, CDE for individualized nutrition education.   -Exercise: If you are able: 30 -60 minutes a day ,4 days a week, or 150 minutes of moderate intensity exercise weekly.    The longer the better if tolerated.  Combine stretch, strength, and aerobic activities.  If you were told in the past that you have high risk for cardiovascular diseases, or if you are currently symptomatic, you may seek evaluation by your heart doctor prior to initiating moderate to intense exercise programs.                                    Additional Care Considerations for Diabetes/Prediabetes     -Diabetes  is a chronic disease.  The most important care consideration is regular follow-up with your diabetes care provider with the goal being avoiding or delaying its complications and to take advantage of advances in medications and technology.  If appropriate actions are taken early enough, type  2 diabetes can even be reversed.  Seek information from the right source.   - Whole Food, Plant Predominant Nutrition is highly recommended: Eat Plenty of vegetables, Mushrooms, fruits, Legumes, Whole Grains, Nuts, seeds in lieu of processed meats, processed snacks/pastries red meat, poultry, eggs as recommended by [employer] of  Lifestyle Medicine (ACLM).   -Type 2 diabetes is known to coexist with other important  comorbidities such as high blood pressure and high cholesterol.  It is critical to control not only the diabetes but also the high blood pressure and high cholesterol to minimize and delay the risk of complications including coronary artery disease, stroke, amputations, blindness, etc.  The good news is that this diet recommendation for type 2 diabetes is also very helpful for managing high cholesterol and high blood blood pressure.   - Studies showed that people with diabetes will benefit from a class of medications known as ACE inhibitors and statins.  Unless there are specific reasons not to be on these medications, the standard of care is to consider getting one from these groups of medications at an optimal doses.  These medications are generally considered safe and proven to help protect the heart and the kidneys.     - People with diabetes are encouraged to initiate and maintain regular follow-up with eye doctors, foot doctors, dentists , and if necessary heart and kidney doctors.      - It is highly recommended that people with diabetes quit smoking or stay away from smoking, and get yearly  flu vaccine and pneumonia vaccine at least every 5 years.  See above for additional recommendations on exercise, sleep, stress management , and healthy social connections.

## 2022-09-06 ENCOUNTER — Other Ambulatory Visit: Payer: Self-pay | Admitting: Critical Care Medicine

## 2022-09-06 LAB — LIPID PANEL
Chol/HDL Ratio: 3.2 ratio (ref 0.0–4.4)
Cholesterol, Total: 213 mg/dL — ABNORMAL HIGH (ref 100–199)
HDL: 66 mg/dL (ref >39)
LDL Chol Calc (NIH): 131 mg/dL — ABNORMAL HIGH (ref 0–99)
Triglycerides: 91 mg/dL (ref 0–149)
VLDL Cholesterol Cal: 16 mg/dL (ref 5–40)

## 2022-09-06 MED ORDER — ATORVASTATIN CALCIUM 20 MG PO TABS: 20.0000 mg | ORAL_TABLET | Freq: Every day | ORAL | 3 refills | Status: DC

## 2022-09-06 NOTE — Progress Notes (Signed)
Let pt know cholesterol extremely high, she must take cholesterol pill ,sent to pharmacy

## 2022-09-07 ENCOUNTER — Telehealth: Payer: Self-pay

## 2022-09-07 NOTE — Telephone Encounter (Signed)
Pt was called and is aware of results, DOB was confirmed.  ?

## 2022-09-07 NOTE — Telephone Encounter (Signed)
-----   Message from Elsie Stain, MD sent at 09/06/2022 12:23 PM EDT ----- Let pt know cholesterol extremely high, she must take cholesterol pill ,sent to pharmacy

## 2022-09-13 ENCOUNTER — Telehealth: Payer: Self-pay

## 2022-09-13 NOTE — Telephone Encounter (Signed)
Victoria Schneider, [facility] with [place] calling for clarification on Uribel rx that was sent on 09/05/22. Needing updated frequency since just says as needed. Advised her of directions and she was going to change frequency to daily as needed. No further assistance needed.   Meth-Hyo-M Bl-Na Phos-Ph Sal (URIBEL) 118 MG CAPS 30 capsule 1 09/05/2022    Sig - Route: Take 1 capsule (118 mg total) by mouth as needed. - Oral   Sent to pharmacy as: Meth-Hyo-M Bl-Na Phos-Ph Sal (URIBEL) 118 MG Cap   E-Prescribing Status: Receipt confirmed by pharmacy (09/05/2022 11:41 AM EDT)

## 2022-10-16 ENCOUNTER — Encounter: Payer: Self-pay | Admitting: Emergency Medicine

## 2022-10-16 ENCOUNTER — Ambulatory Visit
Admission: EM | Admit: 2022-10-16 | Discharge: 2022-10-16 | Disposition: A | Discharge status: Home / Self Care | Payer: Commercial Managed Care - HMO | Attending: Nurse Practitioner | Admitting: Nurse Practitioner

## 2022-10-16 DIAGNOSIS — H6993 Unspecified Eustachian tube disorder, bilateral: Secondary | ICD-10-CM | POA: Diagnosis not present

## 2022-10-16 DIAGNOSIS — J0100 Acute maxillary sinusitis, unspecified: Secondary | ICD-10-CM | POA: Diagnosis not present

## 2022-10-16 MED ORDER — PREDNISONE 20 MG PO TABS: 40.0000 mg | ORAL_TABLET | Freq: Every day | ORAL | 0 refills | Status: AC

## 2022-10-16 MED ORDER — DOXYCYCLINE HYCLATE 100 MG PO CAPS: 100.0000 mg | ORAL_CAPSULE | Freq: Two times a day (BID) | ORAL | 0 refills | Status: AC

## 2022-10-16 NOTE — ED Triage Notes (Signed)
Pt reports 4 weeks having congestion, ear pain, cough, sore throat. Taking nasal spray and cetrizine without helping. Having hoarse voice x 4 days.

## 2022-10-16 NOTE — Discharge Instructions (Signed)
Start doxycycline twice daily for 7 days Prednisone daily for 5 days Continue your allergy medicine and nasal sprays Follow-up with your PCP if your symptoms do not improve Please go to the ER for any worsening symptoms

## 2022-10-16 NOTE — ED Provider Notes (Signed)
[facility]    CSN: [id number] Arrival date & time: 10/16/22  1711      History   Chief Complaint Chief Complaint  Patient presents with   Nasal Congestion   Otalgia    HPI Victoria Schneider is a 42 y.o. female  presents for evaluation of URI symptoms for 1 month. Patient reports associated symptoms of sinus stress/pain with postnasal drip and sore throat.  Over the past 4 days has noticed ear pain and hoarseness.. Denies N/V/D, fevers, cough, body aches, shortness of breath. Patient does not have a hx of asthma or smoking. No known sick contacts.  Pt has taken allergy medicine and Flonase OTC for symptoms. Pt has no other concerns at this time.    Otalgia Associated symptoms: congestion     Past Medical History:  Diagnosis Date   Acute cystitis with hematuria 03/10/2021   Acute cystitis with hematuria 03/10/2021   Elevated LFTs 06/09/2020   GERD (gastroesophageal reflux disease)    Type 2 diabetes mellitus with hyperglycemia, without long-term current use of insulin (HCC) 05/05/2020    Patient Active Problem List   Diagnosis Date Noted   Lumbar back pain 03/16/2022   Cervical cancer screening 03/10/2021   Menorrhagia with regular cycle 03/10/2021   Iron deficiency anemia due to chronic blood loss 03/10/2021   BMI 37.0-37.9, adult 03/10/2021   Vitamin D deficiency 03/10/2021   Hypersomnia 06/09/2020   Type 2 diabetes mellitus with hyperglycemia, without long-term current use of insulin (HCC) 05/05/2020    Past Surgical History:  Procedure Laterality Date   NO PAST SURGERIES      OB History     Gravida  4   Para  4   Term  4   Preterm      AB      Living  4      SAB      IAB      Ectopic      Multiple  0   Live Births  4            Home Medications    Prior to Admission medications   Medication Sig Start Date End Date Taking? Authorizing Provider  atorvastatin (LIPITOR) 20 MG tablet Take 1 tablet (20 mg total) by  mouth daily. 09/06/22   Storm Frisk, MD  doxycycline (VIBRAMYCIN) 100 MG capsule Take 1 capsule (100 mg total) by mouth 2 (two) times daily for 7 days. 10/16/22 10/23/22 Yes Radford Pax, NP  predniSONE (DELTASONE) 20 MG tablet Take 2 tablets (40 mg total) by mouth daily with breakfast for 5 days. 10/16/22 10/21/22 Yes Radford Pax, NP  acetaminophen (TYLENOL) 325 MG tablet Take 650 mg by mouth every 6 (six) hours as needed.    [provider]  Blood Glucose Monitoring Suppl (TRUE METRIX METER) w/Device KIT 1 each by Does not apply route in the morning and at bedtime. 05/05/20   Anders Simmonds, PA-C  cetirizine (ZYRTEC) 10 MG tablet TAKE 1 TABLET BY MOUTH ONCE DAILY AS NEEDED FOR ALLERGIES 04/12/22   Storm Frisk, MD  dapagliflozin propanediol (FARXIGA) 5 MG TABS tablet Take 1 tablet (5 mg total) by mouth daily. 09/05/22   Storm Frisk, MD  ferrous sulfate 325 (65 FE) MG tablet Take 1 tablet (325 mg total) by mouth daily. 12/01/21   Storm Frisk, MD  fluticasone (FLONASE) 50 MCG/ACT nasal spray PLACE 2 SPRAYS INTO BOTH NOSTRILS DAILY. 03/16/22 03/16/23  Delford Field,  Charlcie Cradle, MD  glucose blood (TRUE METRIX BLOOD GLUCOSE TEST) test strip USE AS DIRECTED 05/22/22   Storm Frisk, MD  ibuprofen (ADVIL) 600 MG tablet Take 1 tablet (600 mg total) by mouth every 6 (six) hours as needed. 09/05/22   Storm Frisk, MD  metFORMIN (GLUCOPHAGE) 500 MG tablet Take 2 tablets (1,000 mg total) by mouth daily with breakfast AND 1 tablet (500 mg total) daily with supper. 09/05/22 12/04/22  Storm Frisk, MD  Meth-Hyo-M Bl-Na Phos-Ph Sal (URIBEL) 118 MG CAPS Take 1 capsule (118 mg total) by mouth as needed. 09/05/22   Storm Frisk, MD  TRUEplus Lancets 28G MISC USE AS DIRECTED IN THE MORNING AND AT BEDTIME 12/01/21 12/01/22  Storm Frisk, MD  Vitamin D, Ergocalciferol, (DRISDOL) 1.25 MG (50000 UNIT) CAPS capsule Take 1 capsule (50,000 Units total) by mouth every 7 (seven) days. 09/05/22    Storm Frisk, MD  albuterol (VENTOLIN HFA) 108 (90 Base) MCG/ACT inhaler Inhale 1-2 puffs into the lungs every 6 (six) hours as needed for wheezing or shortness of breath. Patient not taking: Reported on 08/06/2019 07/15/19 02/09/20  Hall-Potvin, [place], PA-C  famotidine (PEPCID) 20 MG tablet One after supper Patient not taking: Reported on 08/06/2019 01/23/19 02/09/20  Nyoka Cowden, MD    Family History Family History  Problem Relation Age of Onset   Diabetes Father    Hypertension Father    Diabetes Brother    Hypertension Mother    Depression Daughter     Social History Social History   Tobacco Use   Smoking status: Never   Smokeless tobacco: Never  Vaping Use   Vaping Use: Never used  Substance Use Topics   Alcohol use: Not Currently    Comment: rarely   Drug use: No     Allergies   Trulicity [dulaglutide] and Penicillins   Review of Systems Review of Systems  HENT:  Positive for congestion, ear pain, sinus pressure and sinus pain.      Physical Exam Triage Vital Signs ED Triage Vitals  Enc Vitals Group     BP 10/16/22 1719 118/82     Pulse Rate 10/16/22 1719 88     Resp 10/16/22 1719 18     Temp 10/16/22 1719 98 F (36.7 C)     Temp src --      SpO2 10/16/22 1719 97 %     Weight --      Height --      Head Circumference --      Peak Flow --      Pain Score 10/16/22 1718 5     Pain Loc --      Pain Edu? --      Excl. in GC? --    No data found.  Updated Vital Signs BP 118/82 (BP Location: Right Arm)   Pulse 88   Temp 98 F (36.7 C)   Resp 18   LMP 10/14/2022   SpO2 97%   Visual Acuity Right Eye Distance:   Left Eye Distance:   Bilateral Distance:    Right Eye Near:   Left Eye Near:    Bilateral Near:     Physical Exam Vitals and nursing note reviewed.  Constitutional:      General: She is not in acute distress.    Appearance: She is well-developed. She is not ill-appearing.  HENT:     Head: Normocephalic and atraumatic.      Right Ear: Ear  canal normal. A middle ear effusion is present. Tympanic membrane is not erythematous.     Left Ear: Ear canal normal. A middle ear effusion is present. Tympanic membrane is not erythematous.     Nose: Mucosal edema and congestion present.     Right Turbinates: Swollen and pale.     Left Turbinates: Swollen and pale.     Right Sinus: Maxillary sinus tenderness present.     Left Sinus: Maxillary sinus tenderness present.     Mouth/Throat:     Mouth: Mucous membranes are moist.     Pharynx: Oropharynx is clear. Uvula midline. Posterior oropharyngeal erythema present.     Tonsils: No tonsillar exudate or tonsillar abscesses.  Eyes:     Conjunctiva/sclera: Conjunctivae normal.     Pupils: Pupils are equal, round, and reactive to light.  Cardiovascular:     Rate and Rhythm: Normal rate and regular rhythm.     Heart sounds: Normal heart sounds.  Pulmonary:     Effort: Pulmonary effort is normal.     Breath sounds: Normal breath sounds.  Musculoskeletal:     Cervical back: Normal range of motion and neck supple.  Lymphadenopathy:     Cervical: No cervical adenopathy.  Skin:    General: Skin is warm and dry.  Neurological:     General: No focal deficit present.     Mental Status: She is alert and oriented to person, place, and time.  Psychiatric:        Mood and Affect: Mood normal.        Behavior: Behavior normal.      UC Treatments / Results  Labs (all labs ordered are listed, but only abnormal results are displayed) Labs Reviewed - No data to display  Status: Final result     Visible to patient: Yes (not seen)     Next appt: 10/19/2022 at 10:10 AM in Internal Medicine Georgian Co, PA-C)     Dx: Michaell Cowing hematuria; Flank pain; Type 2 d...   1 Result Note     1 Follow-up Encounter     1 HM Topic          Component Ref Range & Units 7 mo ago (02/28/22) 1 yr ago (08/25/21) 1 yr ago (03/09/21) 2 yr ago (10/14/20) 2 yr ago (05/05/20) 5 yr  ago (07/22/17) 6 yr ago (08/08/16)  Glucose 70 - 99 mg/dL 161 High  096 High  045 High  R, CM 106 High  R 279 High  R 113 High  R 135 High  R  BUN 6 - 24 mg/dL 7 6 5  Low  7 4 Low  5 Low  R 7 R  Creatinine, Ser 0.57 - 1.00 mg/dL 4.09 8.11 9.14 7.82 9.56 0.70 R 0.50 R  eGFR >59 mL/min/1.73 111 112 116 117     BUN/Creatinine Ratio 9 - 23 10 9 8  Low  12 5 Low     Sodium 134 - 144 mmol/L 141 139 140 139 136 139 R 141 R  Potassium 3.5 - 5.2 mmol/L 4.2 4.4 4.7 4.3 4.5 3.9 R 3.7 R  Chloride 96 - 106 mmol/L 102 102 104 103 102 105 R 106 R  CO2 20 - 29 mmol/L 26 25 24 21 21 25  R   Calcium 8.7 - 10.2 mg/dL 9.7 9.7 9.3 8.8 9.0 9.1 R   Resulting Agency [facility]  EKG   Radiology No results found.  Procedures Procedures (including critical care time)  Medications Ordered in UC Medications - No data to display  Initial Impression / Assessment and Plan / UC Course  I have reviewed the triage vital signs and the nursing notes.  Pertinent labs & imaging results that were available during my care of the patient were reviewed by me and considered in my medical decision making (see chart for details).     Reviewed recent progress notes and labs Will start doxycycline for sinusitis given length of symptoms Prednisone daily for 5 days for eustachian tube dysfunction Continue Flonase and allergy medications Follow-up with PCP if symptoms do not improve ER precautions reviewed and patient verbalized understanding Final Clinical Impressions(s) / UC Diagnoses   Final diagnoses:  Dysfunction of both eustachian tubes  Acute maxillary sinusitis, recurrence not specified     Discharge Instructions      Start doxycycline twice daily for 7 days Prednisone daily for 5 days Continue your allergy medicine and nasal sprays Follow-up with your PCP if your symptoms do not improve Please go to the ER for any worsening  symptoms   ED Prescriptions     Medication Sig Dispense Auth. Provider   predniSONE (DELTASONE) 20 MG tablet Take 2 tablets (40 mg total) by mouth daily with breakfast for 5 days. 10 tablet Radford Pax, NP   doxycycline (VIBRAMYCIN) 100 MG capsule Take 1 capsule (100 mg total) by mouth 2 (two) times daily for 7 days. 14 capsule Radford Pax, NP      PDMP not reviewed this encounter.   Radford Pax, NP 10/16/22 [phone]

## 2022-10-19 ENCOUNTER — Ambulatory Visit: Payer: Commercial Managed Care - HMO | Admitting: Physician Assistant

## 2022-11-02 ENCOUNTER — Ambulatory Visit: Payer: Commercial Managed Care - HMO | Attending: Physician Assistant | Admitting: Physician Assistant

## 2022-11-02 ENCOUNTER — Encounter: Payer: Self-pay | Admitting: Physician Assistant

## 2022-11-02 ENCOUNTER — Other Ambulatory Visit (HOSPITAL_COMMUNITY)
Admission: RE | Admit: 2022-11-02 | Discharge: 2022-11-02 | Disposition: A | Discharge status: Home / Self Care | Payer: Commercial Managed Care - HMO | Source: Ambulatory Visit | Attending: Physician Assistant | Admitting: Physician Assistant

## 2022-11-02 VITALS — BP 120/86 | HR 62 | Wt 219.2 lb

## 2022-11-02 DIAGNOSIS — N898 Other specified noninflammatory disorders of vagina: Secondary | ICD-10-CM | POA: Diagnosis not present

## 2022-11-02 DIAGNOSIS — Z7984 Long term (current) use of oral hypoglycemic drugs: Secondary | ICD-10-CM

## 2022-11-02 DIAGNOSIS — E1165 Type 2 diabetes mellitus with hyperglycemia: Secondary | ICD-10-CM | POA: Diagnosis not present

## 2022-11-02 DIAGNOSIS — T7840XD Allergy, unspecified, subsequent encounter: Secondary | ICD-10-CM

## 2022-11-02 DIAGNOSIS — L659 Nonscarring hair loss, unspecified: Secondary | ICD-10-CM

## 2022-11-02 DIAGNOSIS — H6992 Unspecified Eustachian tube disorder, left ear: Secondary | ICD-10-CM | POA: Diagnosis not present

## 2022-11-02 LAB — POCT URINALYSIS DIP (CLINITEK)
Bilirubin, UA: NEGATIVE
Glucose, UA: 500 mg/dL — AB
Leukocytes, UA: NEGATIVE
Nitrite, UA: NEGATIVE
POC PROTEIN,UA: 100 — AB
Spec Grav, UA: >=1.03 — AB (ref 1.010–1.025)
Urobilinogen, UA: 0.2 E.U./dL
pH, UA: 5.5 (ref 5.0–8.0)

## 2022-11-02 LAB — GLUCOSE, POCT (MANUAL RESULT ENTRY): POC Glucose: 276 mg/dl — AB (ref 70–99)

## 2022-11-02 MED ORDER — LEVOCETIRIZINE DIHYDROCHLORIDE 5 MG PO TABS
5.0000 mg | ORAL_TABLET | Freq: Every evening | ORAL | 1 refills | Status: DC
Start: 2022-11-02 — End: 2023-10-04

## 2022-11-02 MED ORDER — FLUTICASONE PROPIONATE 50 MCG/ACT NA SUSP
2.0000 | Freq: Every day | NASAL | 6 refills | Status: DC
Start: 2022-11-02 — End: 2023-10-04

## 2022-11-02 MED ORDER — FLUCONAZOLE 150 MG PO TABS: 150.0000 mg | ORAL_TABLET | Freq: Once | ORAL | 0 refills | Status: AC

## 2022-11-02 NOTE — Progress Notes (Signed)
Patient ID: Victoria Schneider, female   DOB: 1981/05/26, 42 y.o.   MRN: [id number]     Naiyeli Rotella, is a 42 y.o. female  WUJ:[id number]  [id number]  DOB - 1981/04/01  Chief Complaint  Patient presents with   Vaginal Itching    ras   Rash   Alopecia   Medication Refill       Subjective:   Victoria Schneider is a 42 y.o. female here today several concerns.  She was treated after she Went to UC and prescribed antibiotics and steroids.  Nos she is having vaginal itching and burning.    She also has a spot of hair loss in the R parietal scalp.  Her sister noticed it about 1 month ago.  She has not noticed itching or any problem in the area.  No new soaps/detergents/hair treatment of products.   No problems updated.  ALLERGIES: Allergies  Allergen Reactions   Trulicity [Dulaglutide] Nausea And Vomiting   Penicillins Rash and Other (See Comments)    Has patient had a PCN reaction causing immediate rash, facial/tongue/throat swelling, SOB or lightheadedness with hypotension: No Has patient had a PCN reaction causing severe rash involving mucus membranes or skin necrosis: No Has patient had a PCN reaction that required hospitalization No Has patient had a PCN reaction occurring within the last 10 years: Yes If all of the above answers are "NO", then may proceed with Cephalosporin use.     PAST MEDICAL HISTORY: Past Medical History:  Diagnosis Date   Acute cystitis with hematuria 03/10/2021   Acute cystitis with hematuria 03/10/2021   Elevated LFTs 06/09/2020   GERD (gastroesophageal reflux disease)    Type 2 diabetes mellitus with hyperglycemia, without long-term current use of insulin (HCC) 05/05/2020    MEDICATIONS AT HOME: Prior to Admission medications   Medication Sig Start Date End Date Taking? Authorizing Provider  acetaminophen (TYLENOL) 325 MG tablet Take 650 mg by mouth every 6 (six) hours as needed.   Yes [provider]  atorvastatin  (LIPITOR) 20 MG tablet Take 1 tablet (20 mg total) by mouth daily. 09/06/22  Yes Storm Frisk, MD  Blood Glucose Monitoring Suppl (TRUE METRIX METER) w/Device KIT 1 each by Does not apply route in the morning and at bedtime. 05/05/20  Yes Georgian Co M, PA-C  dapagliflozin propanediol (FARXIGA) 5 MG TABS tablet Take 1 tablet (5 mg total) by mouth daily. 09/05/22  Yes Storm Frisk, MD  ferrous sulfate 325 (65 FE) MG tablet Take 1 tablet (325 mg total) by mouth daily. 12/01/21  Yes Storm Frisk, MD  glucose blood (TRUE METRIX BLOOD GLUCOSE TEST) test strip USE AS DIRECTED 05/22/22  Yes Storm Frisk, MD  ibuprofen (ADVIL) 600 MG tablet Take 1 tablet (600 mg total) by mouth every 6 (six) hours as needed. 09/05/22  Yes Storm Frisk, MD  levocetirizine (XYZAL) 5 MG tablet Take 1 tablet (5 mg total) by mouth every evening. 11/02/22  Yes Georgian Co M, PA-C  metFORMIN (GLUCOPHAGE) 500 MG tablet Take 2 tablets (1,000 mg total) by mouth daily with breakfast AND 1 tablet (500 mg total) daily with supper. 09/05/22 12/04/22 Yes Storm Frisk, MD  Meth-Hyo-M Bl-Na Phos-Ph Sal (URIBEL) 118 MG CAPS Take 1 capsule (118 mg total) by mouth as needed. 09/05/22  Yes Storm Frisk, MD  TRUEplus Lancets 28G MISC USE AS DIRECTED IN THE MORNING AND AT BEDTIME 12/01/21 12/01/22 Yes Storm Frisk, MD  Vitamin D, Ergocalciferol, (  DRISDOL) 1.25 MG (50000 UNIT) CAPS capsule Take 1 capsule (50,000 Units total) by mouth every 7 (seven) days. 09/05/22  Yes Storm Frisk, MD  fluticasone (FLONASE) 50 MCG/ACT nasal spray PLACE 2 SPRAYS INTO BOTH NOSTRILS DAILY. 11/02/22 11/02/23  Anders Simmonds, PA-C  albuterol (VENTOLIN HFA) 108 (90 Base) MCG/ACT inhaler Inhale 1-2 puffs into the lungs every 6 (six) hours as needed for wheezing or shortness of breath. Patient not taking: Reported on 08/06/2019 07/15/19 02/09/20  Hall-Potvin, [place], PA-C  famotidine (PEPCID) 20 MG tablet One after supper Patient  not taking: Reported on 08/06/2019 01/23/19 02/09/20  Nyoka Cowden, MD    ROS: Neg HEENT Neg resp Neg cardiac Neg GI Neg GU Neg MS Neg psych Neg neuro  Objective:   Vitals:   11/02/22 1618  BP: 120/86  Pulse: 62  SpO2: 98%  Weight: 219 lb 3.2 oz (99.4 kg)   Exam General appearance : Awake, alert, not in any distress. Speech Clear. Not toxic looking HEENT: Atraumatic and Normocephalic B Tm congested.  Scalp overall appears normal.  No dandruff or dry skin.  On the area above her R ear there is a patch 3X5cm that is without hair.  There aren't broken hairs and there isn't a rash.  It appears like smooth skin.   Neck: Supple, no JVD. No cervical lymphadenopathy.  Chest: Good air entry bilaterally, CTAB.  No rales/rhonchi/wheezing CVS: S1 S2 regular, no murmurs.  Extremities: B/L Lower Ext shows no edema, both legs are warm to touch Neurology: Awake alert, and oriented X 3, CN II-XII intact, Non focal Skin: No Rash  Data Review Lab Results  Component Value Date   HGBA1C 7.0 09/05/2022   HGBA1C 6.9 (H) 02/28/2022   HGBA1C 6.2 (A) 08/25/2021    Assessment & Plan   1. Type 2 diabetes mellitus with hyperglycemia, without long-term current use of insulin (HCC) Not at [facility] on diabetic diet.  She did just finish a course of steroids.   - Glucose (CBG)  2. Vaginal itching Likely yeats s/p abx - Cervicovaginal ancillary only - POCT URINALYSIS DIP (CLINITEK) - fluconazole (DIFLUCAN) 150 MG tablet; Take 1 tablet (150 mg total) by mouth once for 1 dose.  Dispense: 1 tablet; Refill: 0  3. Eustachian tube dysfunction, left - levocetirizine (XYZAL) 5 MG tablet; Take 1 tablet (5 mg total) by mouth every evening.  Dispense: 90 tablet; Refill: 1 - fluticasone (FLONASE) 50 MCG/ACT nasal spray; PLACE 2 SPRAYS INTO BOTH NOSTRILS DAILY.  Dispense: 16 g; Refill: 6  4. Allergy, subsequent encounter - fluticasone (FLONASE) 50 MCG/ACT nasal spray; PLACE 2 SPRAYS INTO BOTH NOSTRILS  DAILY.  Dispense: 16 g; Refill: 6 - TSH  5. Hair loss Biotin and collagen supplements advised - Ambulatory referral to Dermatology    Return for next appt scheduled July with Dr Delford Field.  The patient was given clear instructions to go to ER or return to medical center if symptoms don't improve, worsen or new problems develop. The patient verbalized understanding. The patient was told to call to get lab results if they haven't heard anything in the next week.      Georgian Co, PA-C [facility] [place], [place] [phone]   11/05/2022, 11:23 AM

## 2022-11-02 NOTE — Patient Instructions (Signed)
Drink 80- 100 ounces water daily.  You are a little dehydrated

## 2022-11-03 LAB — TSH: TSH: 2.1 u[IU]/mL (ref 0.450–4.500)

## 2022-11-06 LAB — CERVICOVAGINAL ANCILLARY ONLY
Bacterial Vaginitis (gardnerella): NEGATIVE
Candida Glabrata: NEGATIVE
Candida Vaginitis: POSITIVE — AB
Chlamydia: NEGATIVE
Comment: NEGATIVE
Comment: NEGATIVE
Comment: NEGATIVE
Comment: NEGATIVE
Comment: NEGATIVE
Comment: NORMAL
Neisseria Gonorrhea: NEGATIVE
Trichomonas: NEGATIVE

## 2022-11-17 ENCOUNTER — Telehealth: Payer: Self-pay

## 2022-11-17 ENCOUNTER — Encounter: Payer: Self-pay | Admitting: *Deleted

## 2022-11-17 NOTE — Telephone Encounter (Signed)
Pt given lab results per notes of A. McClung PA-C  on 11/17/22. Pt verbalized understanding.Pt call transferred to [facility] per pt request for appt.

## 2022-12-26 ENCOUNTER — Other Ambulatory Visit: Payer: Self-pay

## 2022-12-27 ENCOUNTER — Other Ambulatory Visit: Payer: Self-pay | Admitting: Critical Care Medicine

## 2022-12-27 DIAGNOSIS — E1165 Type 2 diabetes mellitus with hyperglycemia: Secondary | ICD-10-CM

## 2022-12-27 NOTE — Telephone Encounter (Signed)
Requested Prescriptions  Pending Prescriptions Disp Refills   glucose blood (TRUE METRIX BLOOD GLUCOSE TEST) test strip [Pharmacy Med Name: TRUE METRIX GLUCOSE TES] 100 each 0    Sig: USE AS DIRECTED     Endocrinology: Diabetes - Testing Supplies Passed - 12/27/2022  8:15 AM      Passed - Valid encounter within last 12 months    Recent Outpatient Visits           1 month ago Type 2 diabetes mellitus with hyperglycemia, without long-term current use of insulin [facility])   [facility] [place], [place], [place]   3 months ago Type 2 diabetes mellitus with hyperglycemia, without long-term current use of insulin [facility])   [place] [facility] Storm Frisk, MD   7 months ago Acute cystitis with hematuria   [facility] Storm Frisk, MD   9 months ago Type 2 diabetes mellitus with hyperglycemia, without long-term current use of insulin [facility])   [facility] Storm Frisk, MD   10 months ago Gross hematuria   [place] [facility] Storm Frisk, MD       Future Appointments             In 1 week Storm Frisk, MD [facility]

## 2023-01-08 NOTE — Progress Notes (Unsigned)
Established Patient Office Visit  Subjective:  Patient ID: Victoria Schneider, female    DOB: 06-09-1981  Age: 42 y.o. MRN: [id number]    History of Present Illness:   No chief complaint on file.    HPI 02/2021 Victoria Schneider presents for primary care follow-up visit last seen December 2021.  Patient has been noting periods of blood in the urine without dysuria.  She was seen previously this year in the emergency room for acute cystitis with hematuria.  Treated with a course of antibiotics for E. coli.  Patient does note excess menses with each menstrual period.  Patient does maintain metformin 1000 mg a.m. 500 mg p.m. for type 2 diabetes.  A1c had been controlled previously.  Blood sugar on arrival today 115 patient has also mild iron deficiency anemia from previous lab testing for which she is on iron supplementation. Patient does have mild vitamin D deficiency and is on vitamin D supplement.  Prior history of hepatic steatosis with elevated liver functions for which use of statin was held.  Patient still has significant obesity BMI of nearly 38.  Patient does state her bilateral wrist pain has been improved suspected to be due to carpal tunnel syndrome and doing well with wrist splints at night.  Patient also has hypersomnia with excess snoring.  She was not able to follow through with a referral to sleep medicine.  12/01/21 patient did not need interpreter This is a return visit from prior visit in September 2022.  In the interim the patient was seen in May for a bout of sinusitis from which she has resolved.  She does still have some ear popping and was given Zyrtec-D and Flonase by the PA.  Patient also had a visit in March was found to have hematuria and iron deficiency anemia referred to urology she has never been able to complete the appointment as she did not receive a phone call but they state they tried to call with no answer.  She is also had low back pain and neck pain that  is gotten worse and her weight is up to 215 pounds.  Note in March with her diabetes she was screened again has an A1c of 6.2 and is on the metformin at 1000 mg daily.  She had low vitamin D levels and was recommended to take vitamin D but she is not actually on any supplements at this time.  Today she needs a urine microalbumin assessed she needs a Pap smear foot exam and needs to get her eye exam performed  Patient still notes some blood in the urine and has occasional vaginal discharge.  There are no other complaints.  She is due a pneumonia vaccine she agrees to receive at this visit.   Below is assessment from the PA visit in March [place] 08/2021   1. Type 2 diabetes mellitus with hyperglycemia, without long-term current use of insulin (HCC) Controlled on the dose she is actually taking which I will send prescription she is currently taking it now - Glucose (CBG) - HgB A1c - POCT URINALYSIS DIP (CLINITEK) - Comprehensive metabolic panel - metFORMIN (GLUCOPHAGE) 500 MG tablet; 2 tabs daily with dinner  Dispense: 60 tablet; Refill: 3   2. Other microscopic hematuria No infection-increase water intake to 80-100 ounces water daily - POCT URINALYSIS DIP (CLINITEK) - Ambulatory referral to Urology   3. Dysfunction of both eustachian tubes Flonase sent - fluticasone (FLONASE) 50 MCG/ACT nasal spray; PLACE 2 SPRAYS INTO  BOTH NOSTRILS DAILY.  Dispense: 16 g; Refill: 6   4. Iron deficiency anemia due to chronic blood loss - ferrous sulfate 325 (65 FE) MG tablet; Take 1 tablet (325 mg total) by mouth daily.  Dispense: 100 tablet; Refill: 1 - CBC with Differential/Platelet   6. Allergy, subsequent encounter - cetirizine-pseudoephedrine (ZYRTEC-D) 5-120 MG tablet; Take 1 tablet by mouth 2 (two) times daily. prn  Dispense: 60 tablet; Refill: 1 - fluticasone (FLONASE) 50 MCG/ACT nasal spray; PLACE 2 SPRAYS INTO BOTH NOSTRILS DAILY.  Dispense: 16 g; Refill: 6   7. Vitamin D deficiency -  Vitamin D, 25-hydroxy   8. Chronic right-sided low back pain without sciatica - ibuprofen (ADVIL) 800 MG tablet; Take 1 tablet (800 mg total) by mouth 3 (three) times daily. Prn pain  Dispense: 30 tablet; Refill: 0 - methocarbamol (ROBAXIN) 500 MG tablet; Take 2 tablets (1,000 mg total) by mouth every 8 (eight) hours as needed for muscle spasms.  Dispense: 90 tablet; Refill: 0       The patient never received her Pap smear never got the visit with gynecology.  9/11 This patient is seen in return follow-up by way of a phone visit she has had continued flank pain bilaterally and was seen previously this year for blood in the urine by urology in [place].  Cystoscopy was done was normal repeat urinalysis showed blood in the urine but urine cultures were negative she had a visit here in our clinic recently had urine cultures done which showed multiple organisms which suggested repeat collection.  She is yet to have a CT renal study but she did have a renal ultrasound that was unremarkable did not show hydronephrosis.  Patient was not able to come to the office today and instead was set up for a virtual visit.  She still having flank pain.  She has been to urgent care more recently.  She was given Bactrim and this helped to some degree but her symptoms have recurred.  9/28 Patient returns from the last visit that was a phone visit and September of this month.  She still having low back pain but it is less.  She denies any blood in the urine at this time.  She is yet to achieve the lumbosacral spine x-ray. Urinalysis previously has shown some hematuria.  She had a CT renal study which was negative for stones or abnormalities in the kidneys.  She continues to eat a lot of rice tortillas in her diet.  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  No other complaints.  11/30 Patient returns still having difficulty with dysuria and hematuria and had been to the emergency room where she  was evaluated had blood in the urine but urine culture showed multiple species contaminated.  Note she declines flu vaccine and blood pressure is good at this visit.  Patient does have lower abdominal pain with current symptom complex she was referred to urology but they declined to see her because she has [place] insurance  09/05/22 Patient seen in return follow-up from prior visit in November of last year.  On arrival A1c is 7 blood sugar 174.  She was not able to tolerate Trulicity is now off this.  She does maintain metformin daily.  She would like refill on her high-dose ibuprofen for menstrual cramps.  She did have an eye exam 6 months ago at the [facility] and will get need to get records.  Last menstrual period  2 weeks ago.  She is not interested in birth control medication.  She states the menstrual cramps are mild.  Patient did have cystitis and hematuria she saw urology they did not feel like anything further was needed other than institution of Uribel taking as needed the patient would like refill on ibuprofen   below is documentation from last urology visit in December . Microhematuria She has had a recent renal ultrasound, CT abdomen/pelvis and cystoscopy and does not need further evaluation for microhematuria unless she has a significant increase in the amount of her hematuria or development of gross hematuria.  Previous UA at time of cystoscopy 12/2021 with 11-30 RBCs UA today with 3-10 RBCs however there were >10 epis indicating a contaminated specimen and not valid We discussed the high false-negative rate of dipstick positive readings for hematuria and recommend sending UAs for microscopic examination if they show positive blood on dipstick   2.  Lower urinary tract symptoms Intermittent episodes of pain at the end of urination, frequency and urgency.  Urine cultures have been negative We discussed possibility of dietary triggers and she was provided literature on foods and  beverages to avoid Rx Uribel 4 times daily as needed for symptom onset Recommend she contact our office for an acute visit if symptoms recur Follow-up visit 3 months    7/23  Past Medical History:  Diagnosis Date   Acute cystitis with hematuria 03/10/2021   Acute cystitis with hematuria 03/10/2021   Elevated LFTs 06/09/2020   GERD (gastroesophageal reflux disease)    Type 2 diabetes mellitus with hyperglycemia, without long-term current use of insulin (HCC) 05/05/2020    Past Surgical History:  Procedure Laterality Date   NO PAST SURGERIES      Family History  Problem Relation Age of Onset   Diabetes Father    Hypertension Father    Diabetes Brother    Hypertension Mother    Depression Daughter     Social History   Socioeconomic History   Marital status: Single    Spouse name: Not on file   Number of children: Not on file   Years of education: Not on file   Highest education level: Not on file  Occupational History   Not on file  Tobacco Use   Smoking status: Never   Smokeless tobacco: Never  Vaping Use   Vaping status: Never Used  Substance and Sexual Activity   Alcohol use: Not Currently    Comment: rarely   Drug use: No   Sexual activity: Yes    Birth control/protection: None  Other Topics Concern   Not on file  Social History Narrative   Not on file   Social Determinants of Health   Financial Resource Strain: Not on file  Food Insecurity: Not on file  Transportation Needs: Not on file  Physical Activity: Not on file  Stress: Not on file  Social Connections: Not on file  Intimate Partner Violence: Not on file    Outpatient Medications Prior to Visit  Medication Sig Dispense Refill   acetaminophen (TYLENOL) 325 MG tablet Take 650 mg by mouth every 6 (six) hours as needed.     atorvastatin (LIPITOR) 20 MG tablet Take 1 tablet (20 mg total) by mouth daily. 90 tablet 3   Blood Glucose Monitoring Suppl (TRUE METRIX METER) w/Device KIT 1 each by  Does not apply route in the morning and at bedtime. 1 kit 0   dapagliflozin propanediol (FARXIGA) 5 MG TABS tablet Take 1  tablet (5 mg total) by mouth daily. 30 tablet 5   ferrous sulfate 325 (65 FE) MG tablet Take 1 tablet (325 mg total) by mouth daily. 100 tablet 1   fluticasone (FLONASE) 50 MCG/ACT nasal spray PLACE 2 SPRAYS INTO BOTH NOSTRILS DAILY. 16 g 6   glucose blood (TRUE METRIX BLOOD GLUCOSE TEST) test strip USE AS DIRECTED 100 each 0   ibuprofen (ADVIL) 600 MG tablet Take 1 tablet (600 mg total) by mouth every 6 (six) hours as needed. 90 tablet 1   levocetirizine (XYZAL) 5 MG tablet Take 1 tablet (5 mg total) by mouth every evening. 90 tablet 1   metFORMIN (GLUCOPHAGE) 500 MG tablet Take 2 tablets (1,000 mg total) by mouth daily with breakfast AND 1 tablet (500 mg total) daily with supper. 180 tablet 3   Meth-Hyo-M Bl-Na Phos-Ph Sal (URIBEL) 118 MG CAPS Take 1 capsule (118 mg total) by mouth as needed. 30 capsule 1   Vitamin D, Ergocalciferol, (DRISDOL) 1.25 MG (50000 UNIT) CAPS capsule Take 1 capsule (50,000 Units total) by mouth every 7 (seven) days. 16 capsule 3   No facility-administered medications prior to visit.    Allergies  Allergen Reactions   Trulicity [Dulaglutide] Nausea And Vomiting   Penicillins Rash and Other (See Comments)    Has patient had a PCN reaction causing immediate rash, facial/tongue/throat swelling, SOB or lightheadedness with hypotension: No Has patient had a PCN reaction causing severe rash involving mucus membranes or skin necrosis: No Has patient had a PCN reaction that required hospitalization No Has patient had a PCN reaction occurring within the last 10 years: Yes If all of the above answers are "NO", then may proceed with Cephalosporin use.     ROS Review of Systems  Constitutional:  Negative for chills, diaphoresis and fever.  HENT:  Negative for congestion, hearing loss, nosebleeds, sore throat and tinnitus.   Eyes:  Negative for  photophobia and redness.  Respiratory:  Negative for cough, shortness of breath, wheezing and stridor.   Cardiovascular:  Negative for chest pain, palpitations and leg swelling.  Gastrointestinal:  Negative for abdominal pain, blood in stool, constipation, diarrhea, nausea and vomiting.  Endocrine: Negative for polydipsia.  Genitourinary:  Negative for dysuria, flank pain, frequency, hematuria, menstrual problem, pelvic pain and urgency.  Musculoskeletal:  Positive for back pain. Negative for myalgias and neck pain.  Skin:  Negative for rash.  Allergic/Immunologic: Negative for environmental allergies.  Neurological:  Negative for dizziness, tremors, seizures, weakness and headaches.  Hematological:  Does not bruise/bleed easily.  Psychiatric/Behavioral:  Negative for suicidal ideas. The patient is not nervous/anxious.       Objective:    There were no vitals filed for this visit.   Gen: Pleasant, well-nourished, in no distress,  normal affect  ENT: No lesions,  mouth clear,  oropharynx clear, no postnasal drip  Neck: No JVD, no TMG, no carotid bruits  Lungs: No use of accessory muscles, no dullness to percussion, clear without rales or rhonchi  Cardiovascular: RRR, heart sounds normal, no murmur or gallops, no peripheral edema  Abdomen: Suprapubic tenderness no HSM,  BS normal  Musculoskeletal: No deformities, no cyanosis or clubbing  Neuro: alert, non focal  Skin: Warm, no lesions or rashes  No results found.   There were no vitals taken for this visit. Wt Readings from Last 3 Encounters:  11/02/22 219 lb 3.2 oz (99.4 kg)  09/05/22 223 lb 6.4 oz (101.3 kg)  05/31/22 220 lb (99.8 kg)  There were no vitals filed for this visit.   Gen: Pleasant, well-nourished, in no distress,  normal affect  ENT: No lesions,  mouth clear,  oropharynx clear, no postnasal drip  Neck: No JVD, no TMG, no carotid bruits  Lungs: No use of accessory muscles, no dullness to  percussion, clear without rales or rhonchi  Cardiovascular: RRR, heart sounds normal, no murmur or gallops, no peripheral edema  Abdomen: soft and NT, no HSM,  BS normal  Musculoskeletal: No deformities, no cyanosis or clubbing, muscles and lumbar spine paraspinal are spastic and tender  Neuro: alert, non focal  Skin: Warm, no lesions or rashes  No results found.   Health Maintenance Due  Topic Date Due   OPHTHALMOLOGY EXAM  Never done   DTaP/Tdap/Td (2 - Td or Tdap) 10/03/2022   Diabetic kidney evaluation - Urine ACR  12/02/2022   FOOT EXAM  12/02/2022    There are no preventive care reminders to display for this patient.  Lab Results  Component Value Date   TSH 2.100 11/02/2022   Lab Results  Component Value Date   WBC 7.8 02/28/2022   HGB 12.3 02/28/2022   HCT 38.8 02/28/2022   MCV 78 (L) 02/28/2022   PLT 344 02/28/2022   Lab Results  Component Value Date   NA 141 02/28/2022   K 4.2 02/28/2022   CO2 26 02/28/2022   GLUCOSE 115 (H) 02/28/2022   BUN 7 02/28/2022   CREATININE 0.69 02/28/2022   BILITOT 0.6 08/25/2021   ALKPHOS 141 (H) 08/25/2021   AST 22 08/25/2021   ALT 26 08/25/2021   PROT 7.4 08/25/2021   ALBUMIN 4.3 08/25/2021   CALCIUM 9.7 02/28/2022   ANIONGAP 9 07/22/2017   EGFR 111 02/28/2022   Lab Results  Component Value Date   CHOL 213 (H) 09/05/2022   Lab Results  Component Value Date   HDL 66 09/05/2022   Lab Results  Component Value Date   LDLCALC 131 (H) 09/05/2022   Lab Results  Component Value Date   TRIG 91 09/05/2022   Lab Results  Component Value Date   CHOLHDL 3.2 09/05/2022   Lab Results  Component Value Date   HGBA1C 7.0 09/05/2022      Assessment & Plan:   Problem List Items Addressed This Visit    I personally reviewed all images and lab data in the [facility] system as well as any outside material available during this office visit and agree with the  radiology impressions.   No problem-specific Assessment & Plan  notes found for this encounter.   There are no diagnoses linked to this encounter.  Return follow-up 4 months for diabetes Shan Levans, MD

## 2023-01-09 ENCOUNTER — Ambulatory Visit: Payer: Commercial Managed Care - HMO | Attending: Critical Care Medicine | Admitting: Critical Care Medicine

## 2023-01-09 ENCOUNTER — Other Ambulatory Visit: Payer: Self-pay | Admitting: Critical Care Medicine

## 2023-01-09 ENCOUNTER — Encounter: Payer: Self-pay | Admitting: Critical Care Medicine

## 2023-01-09 VITALS — BP 118/80 | HR 91 | Wt 213.8 lb

## 2023-01-09 DIAGNOSIS — Z7984 Long term (current) use of oral hypoglycemic drugs: Secondary | ICD-10-CM

## 2023-01-09 DIAGNOSIS — N029 Recurrent and persistent hematuria with unspecified morphologic changes: Secondary | ICD-10-CM | POA: Diagnosis not present

## 2023-01-09 DIAGNOSIS — M545 Low back pain, unspecified: Secondary | ICD-10-CM

## 2023-01-09 DIAGNOSIS — E559 Vitamin D deficiency, unspecified: Secondary | ICD-10-CM

## 2023-01-09 DIAGNOSIS — Z87828 Personal history of other (healed) physical injury and trauma: Secondary | ICD-10-CM | POA: Diagnosis not present

## 2023-01-09 DIAGNOSIS — R519 Headache, unspecified: Secondary | ICD-10-CM | POA: Diagnosis not present

## 2023-01-09 DIAGNOSIS — E1165 Type 2 diabetes mellitus with hyperglycemia: Secondary | ICD-10-CM

## 2023-01-09 DIAGNOSIS — R300 Dysuria: Secondary | ICD-10-CM

## 2023-01-09 DIAGNOSIS — E119 Type 2 diabetes mellitus without complications: Secondary | ICD-10-CM

## 2023-01-09 LAB — GLUCOSE, POCT (MANUAL RESULT ENTRY): POC Glucose: 119 mg/dl — AB (ref 70–99)

## 2023-01-09 MED ORDER — METFORMIN HCL 500 MG PO TABS
ORAL_TABLET | ORAL | 3 refills | Status: DC
Start: 2023-01-09 — End: 2023-04-12

## 2023-01-09 MED ORDER — DAPAGLIFLOZIN PROPANEDIOL 5 MG PO TABS: 5.0000 mg | ORAL_TABLET | Freq: Every day | ORAL | 5 refills | Status: DC

## 2023-01-09 NOTE — Assessment & Plan Note (Signed)
Maintain vitamin D

## 2023-01-09 NOTE — Patient Instructions (Signed)
CT of head to be obtained Labs today Medications refilled  Second opinion to urology next to [place]  Return 3 months

## 2023-01-09 NOTE — Assessment & Plan Note (Signed)
Type 2 diabetes blood sugar today is in the low 100s.  Patient to maintain metformin 1000 mg in the morning 500 mg with supper and maintain Farxiga 5 mg daily

## 2023-01-09 NOTE — Assessment & Plan Note (Signed)
Referral to orthopedics made 

## 2023-01-10 ENCOUNTER — Telehealth: Payer: Self-pay

## 2023-01-10 LAB — RENAL FUNCTION PANEL
Albumin: 4.3 g/dL (ref 3.9–4.9)
BUN/Creatinine Ratio: 10 (ref 9–23)
BUN: 6 mg/dL (ref 6–24)
CO2: 25 mmol/L (ref 20–29)
Calcium: 9.7 mg/dL (ref 8.7–10.2)
Chloride: 103 mmol/L (ref 96–106)
Creatinine, Ser: 0.63 mg/dL (ref 0.57–1.00)
Glucose: 102 mg/dL — ABNORMAL HIGH (ref 70–99)
Phosphorus: 3.2 mg/dL (ref 3.0–4.3)
Potassium: 5 mmol/L (ref 3.5–5.2)
Sodium: 141 mmol/L (ref 134–144)
eGFR: 113 mL/min/{1.73_m2} (ref >59)

## 2023-01-10 NOTE — Telephone Encounter (Signed)
-----   Message from Shan Levans sent at 01/10/2023  6:02 AM EDT ----- Let pt know kidney normal

## 2023-01-10 NOTE — Progress Notes (Signed)
Let pt know kidney normal

## 2023-01-10 NOTE — Telephone Encounter (Signed)
Pt was called and is aware of results, DOB was confirmed.  ?

## 2023-01-11 LAB — URINE CULTURE

## 2023-01-11 LAB — MICROALBUMIN / CREATININE URINE RATIO
Creatinine, Urine: 241.2 mg/dL
Microalb/Creat Ratio: 54 mg/g creat — ABNORMAL HIGH (ref 0–29)

## 2023-01-11 NOTE — Progress Notes (Signed)
Let pt know kidney stable no damage from diabetes

## 2023-01-11 NOTE — Progress Notes (Signed)
Let pt know no urine infection

## 2023-01-12 ENCOUNTER — Telehealth: Payer: Self-pay

## 2023-01-12 NOTE — Telephone Encounter (Signed)
Pt was called and vm was left, Information has been sent to nurse pool.   

## 2023-01-12 NOTE — Telephone Encounter (Signed)
-----   Message from Shan Levans sent at 01/11/2023 11:54 AM EDT ----- Let pt know no urine infection

## 2023-01-12 NOTE — Telephone Encounter (Signed)
-----   Message from Shan Levans sent at 01/11/2023 11:53 AM EDT ----- Let pt know kidney stable no damage from diabetes

## 2023-01-17 ENCOUNTER — Encounter: Payer: Commercial Managed Care - HMO | Admitting: Urology

## 2023-01-22 ENCOUNTER — Encounter: Payer: Self-pay | Admitting: Critical Care Medicine

## 2023-01-24 ENCOUNTER — Encounter: Payer: Commercial Managed Care - HMO | Admitting: Urology

## 2023-01-26 ENCOUNTER — Ambulatory Visit: Payer: Commercial Managed Care - HMO | Admitting: Urology

## 2023-01-26 ENCOUNTER — Ambulatory Visit
Admission: RE | Admit: 2023-01-26 | Discharge: 2023-01-26 | Disposition: A | Discharge status: Home / Self Care | Payer: Commercial Managed Care - HMO | Source: Ambulatory Visit | Attending: Critical Care Medicine | Admitting: Critical Care Medicine

## 2023-01-26 ENCOUNTER — Encounter: Payer: Self-pay | Admitting: Urology

## 2023-01-26 VITALS — BP 107/75 | HR 83 | Ht 65.0 in | Wt 210.0 lb

## 2023-01-26 DIAGNOSIS — R310 Gross hematuria: Secondary | ICD-10-CM | POA: Diagnosis not present

## 2023-01-26 DIAGNOSIS — Z87828 Personal history of other (healed) physical injury and trauma: Secondary | ICD-10-CM

## 2023-01-26 DIAGNOSIS — R399 Unspecified symptoms and signs involving the genitourinary system: Secondary | ICD-10-CM | POA: Diagnosis not present

## 2023-01-26 DIAGNOSIS — R519 Headache, unspecified: Secondary | ICD-10-CM

## 2023-01-26 NOTE — Progress Notes (Addendum)
   Assessment: 1. Gross hematuria   2. Lower urinary tract symptoms (LUTS)     Plan: Given the development of gross hematuria I have recommended repeat hematuria evaluation with CT-hematuria protocol for upper tract imaging and follow-up cystoscopy. Will also obtain hematuria profile today Follow-up for cystoscopy after above   ADDENDUM: HEMATURIA PROFILE shows dysmorphic RBCs and RBC and heme casts indicative of severe renal parencymal bleeding.   Chief Complaint: Blood in urine  HPI: Victoria Schneider is a 42 y.o. female who presents for continued evaluation of hematuria. Patient has been previously evaluated by Dr. Lonna Cobb in our [place] office. See prior notes including note from 05/31/2022 at the time of last visit. Briefly the patient underwent evaluation for intermediate risk microscopic hematuria with a negative renal ultrasound and cystoscopy performed July 2023.  The patient also had a subsequent noncontrast CT stone study 02/2022 which was unremarkable. Patient in addition to her microscopic hematuria has had episodic episodes of dysuria with multiple urine cultures being negative.  Dr. Lonna Cobb prescribed Ninfa Linden to take as needed during these episodes and this has worked very well.  The patient is currently not experiencing any dysuria or other significant lower urinary tract symptoms.  No UTI symptoms.  The patient presents here now having had several episodes of gross hematuria.  They are not apparently associated with menses.  Over the last several months she has had 3 episodes the last 1 several weeks ago.   Portions of the above documentation were copied from a prior visit for review purposes only.  Allergies: Allergies  Allergen Reactions   Trulicity [Dulaglutide] Nausea And Vomiting   Penicillins Rash and Other (See Comments)    Has patient had a PCN reaction causing immediate rash, facial/tongue/throat swelling, SOB or lightheadedness with hypotension:  No Has patient had a PCN reaction causing severe rash involving mucus membranes or skin necrosis: No Has patient had a PCN reaction that required hospitalization No Has patient had a PCN reaction occurring within the last 10 years: Yes If all of the above answers are "NO", then may proceed with Cephalosporin use.     PMH: Past Medical History:  Diagnosis Date   Acute cystitis with hematuria 03/10/2021   Acute cystitis with hematuria 03/10/2021   Elevated LFTs 06/09/2020   GERD (gastroesophageal reflux disease)    Type 2 diabetes mellitus with hyperglycemia, without long-term current use of insulin (HCC) 05/05/2020    PSH: Past Surgical History:  Procedure Laterality Date   NO PAST SURGERIES      SH: Social History   Tobacco Use   Smoking status: Never   Smokeless tobacco: Never  Vaping Use   Vaping status: Never Used  Substance Use Topics   Alcohol use: Not Currently    Comment: rarely   Drug use: No    ROS: Constitutional:  Negative for fever, chills, weight loss CV: Negative for chest pain, previous MI, hypertension Respiratory:  Negative for shortness of breath, wheezing, sleep apnea, frequent cough GI:  Negative for nausea, vomiting, bloody stool, GERD  PE: BP 107/75   Pulse 83   Ht 5\' 5"  (1.651 m)   Wt 210 lb (95.3 kg)   BMI 34.95 kg/m  GENERAL APPEARANCE:  Well appearing, well developed, well nourished, NAD    Results: UA is remarkable for significant microscopic hematuria with 11-30 RBCs/hpf.  There also prominent epithelial cells as well as bacteria suggesting contamination.

## 2023-01-30 ENCOUNTER — Telehealth: Payer: Self-pay | Admitting: Urology

## 2023-01-30 NOTE — Telephone Encounter (Signed)
Left msg for [place] that the specimen was a voided urine sample. For any additional questions/concerns please call the office.

## 2023-01-30 NOTE — Telephone Encounter (Signed)
[place] [facility] from Pathology [phone]  Called wanting to know how urine was collected on her appt on 08/09.

## 2023-02-01 ENCOUNTER — Encounter: Payer: Self-pay | Admitting: Urology

## 2023-02-05 ENCOUNTER — Encounter: Payer: Self-pay | Admitting: Urology

## 2023-02-09 NOTE — Progress Notes (Signed)
Let patient know CT head normal

## 2023-02-14 ENCOUNTER — Other Ambulatory Visit: Payer: Commercial Managed Care - HMO | Admitting: Urology

## 2023-02-15 ENCOUNTER — Telehealth: Payer: Self-pay

## 2023-02-15 NOTE — Telephone Encounter (Signed)
-----   Message from Shan Levans sent at 02/09/2023  2:41 PM EDT ----- Let patient know CT head normal

## 2023-02-15 NOTE — Telephone Encounter (Signed)
Pt was called and is aware of results, DOB was confirmed.  ?

## 2023-02-16 ENCOUNTER — Ambulatory Visit
Admission: RE | Admit: 2023-02-16 | Discharge: 2023-02-16 | Disposition: A | Discharge status: Home / Self Care | Payer: Commercial Managed Care - HMO | Source: Ambulatory Visit | Attending: Urology | Admitting: Urology

## 2023-02-16 DIAGNOSIS — R310 Gross hematuria: Secondary | ICD-10-CM

## 2023-02-16 MED ORDER — IOPAMIDOL (ISOVUE-300) INJECTION 61%
100.0000 mL | Freq: Once | INTRAVENOUS | Status: AC | PRN
  Administered 2023-02-16: 100 mL via INTRAVENOUS

## 2023-02-27 ENCOUNTER — Ambulatory Visit: Payer: Commercial Managed Care - HMO | Admitting: Urology

## 2023-02-27 VITALS — BP 108/61 | HR 75

## 2023-02-27 DIAGNOSIS — R310 Gross hematuria: Secondary | ICD-10-CM

## 2023-02-27 LAB — URINALYSIS, ROUTINE W REFLEX MICROSCOPIC
Bilirubin, UA: NEGATIVE
Glucose, UA: NEGATIVE
Ketones, UA: NEGATIVE
Leukocytes,UA: NEGATIVE
Nitrite, UA: NEGATIVE
Specific Gravity, UA: 1.02 (ref 1.005–1.030)
Urobilinogen, Ur: 0.2 mg/dL (ref 0.2–1.0)
pH, UA: 7 (ref 5.0–7.5)

## 2023-02-27 LAB — MICROSCOPIC EXAMINATION: Epithelial Cells (non renal): >10 /HPF — AB (ref 0–10)

## 2023-02-27 MED ORDER — SULFAMETHOXAZOLE-TRIMETHOPRIM 800-160 MG PO TABS
1.0000 | ORAL_TABLET | Freq: Once | ORAL | Status: AC
Start: 2023-02-27 — End: 2023-02-27
  Administered 2023-02-27: 1 via ORAL

## 2023-02-27 NOTE — Progress Notes (Signed)
   Assessment: 1. Gross hematuria     Plan: Today following the cystoscopy I had a long discussion with the patient and explained to her that I had a strong suspicion that her hematuria is renal parenchymal in nature and I have recommended nephrologic evaluation.  Patient numerous questions today which I answered.  Will set up referral for further nephrologic evaluation.   Chief Complaint: Blood in urine  HPI: Victoria Schneider is a 42 y.o. female who presents for continued evaluation of hematuria.  Please see my note 01/26/2023 at the time of initial visit for detailed history and exam.  We elected to perform full hematuria evaluation given her development of gross hematuria.  Patient states that her urine has been clear for the last several days but she has had some intermittent tea colored urine as well as gross hematuria.  Hematuria evaluation-- HEMATURIA PROFILE shows dysmorphic RBCs and RBC and heme casts indicative of severe renal parencymal bleeding.   CT-hematuria protocol 02/16/2023 reviewed by me shows no significant GU findings.  No other acute findings noted.  Cystoscopy 02/2023 was normal  Portions of the above documentation were copied from a prior visit for review purposes only.  Allergies: Allergies  Allergen Reactions   Trulicity [Dulaglutide] Nausea And Vomiting   Penicillins Rash and Other (See Comments)    Has patient had a PCN reaction causing immediate rash, facial/tongue/throat swelling, SOB or lightheadedness with hypotension: No Has patient had a PCN reaction causing severe rash involving mucus membranes or skin necrosis: No Has patient had a PCN reaction that required hospitalization No Has patient had a PCN reaction occurring within the last 10 years: Yes If all of the above answers are "NO", then may proceed with Cephalosporin use.     PMH: Past Medical History:  Diagnosis Date   Acute cystitis with hematuria 03/10/2021   Acute cystitis with  hematuria 03/10/2021   Elevated LFTs 06/09/2020   GERD (gastroesophageal reflux disease)    Type 2 diabetes mellitus with hyperglycemia, without long-term current use of insulin (HCC) 05/05/2020    PSH: Past Surgical History:  Procedure Laterality Date   NO PAST SURGERIES      SH: Social History   Tobacco Use   Smoking status: Never   Smokeless tobacco: Never  Vaping Use   Vaping status: Never Used  Substance Use Topics   Alcohol use: Not Currently    Comment: rarely   Drug use: No    ROS: Constitutional:  Negative for fever, chills, weight loss CV: Negative for chest pain, previous MI, hypertension Respiratory:  Negative for shortness of breath, wheezing, sleep apnea, frequent cough GI:  Negative for nausea, vomiting, bloody stool, GERD  PE: BP 108/61   Pulse 75  GENERAL APPEARANCE:  Well appearing, well developed, well nourished, NAD    Results:    PROCEDURE:  FEMALE CYSTOSCOPY  INDICATION:  HEMATURIA  DESCRIPTION OF PROCEDURE: The patient was brought to the procedure room where she was correctly identified and the procedure again reviewed with her.  Informed consent was obtained.  Preprocedural timeout was performed.  Flexible cystoscopy was subsequently performed.  This revealed a normal urethra.  The bladder was entered and carefully inspected.  The ureteral openings appeared normal.  At this time I saw clear E flux from both ureters.  On careful inspection of the urothelium there were no focal mucosal lesions.  Bladder overall looked quite normal.  Procedure well-tolerated.

## 2023-02-27 NOTE — Addendum Note (Signed)
Addended by: Carolin Coy on: 02/27/2023 10:27 AM   Modules accepted: Orders

## 2023-04-12 ENCOUNTER — Encounter: Payer: Self-pay | Admitting: Family Medicine

## 2023-04-12 ENCOUNTER — Ambulatory Visit: Payer: Self-pay

## 2023-04-12 ENCOUNTER — Other Ambulatory Visit: Payer: Self-pay | Admitting: Pharmacist

## 2023-04-12 ENCOUNTER — Ambulatory Visit: Payer: Commercial Managed Care - HMO | Attending: Family Medicine | Admitting: Family Medicine

## 2023-04-12 VITALS — BP 111/74 | HR 79 | Ht 65.0 in | Wt 217.0 lb

## 2023-04-12 DIAGNOSIS — E785 Hyperlipidemia, unspecified: Secondary | ICD-10-CM | POA: Insufficient documentation

## 2023-04-12 DIAGNOSIS — Z7984 Long term (current) use of oral hypoglycemic drugs: Secondary | ICD-10-CM | POA: Diagnosis not present

## 2023-04-12 DIAGNOSIS — E1165 Type 2 diabetes mellitus with hyperglycemia: Secondary | ICD-10-CM

## 2023-04-12 DIAGNOSIS — G8929 Other chronic pain: Secondary | ICD-10-CM

## 2023-04-12 DIAGNOSIS — D500 Iron deficiency anemia secondary to blood loss (chronic): Secondary | ICD-10-CM

## 2023-04-12 DIAGNOSIS — M25561 Pain in right knee: Secondary | ICD-10-CM

## 2023-04-12 DIAGNOSIS — R300 Dysuria: Secondary | ICD-10-CM | POA: Diagnosis not present

## 2023-04-12 LAB — POCT GLYCOSYLATED HEMOGLOBIN (HGB A1C): HbA1c, POC (controlled diabetic range): 6.9 % (ref 0.0–7.0)

## 2023-04-12 MED ORDER — URIBEL 118 MG PO CAPS: 118.0000 mg | ORAL_CAPSULE | Freq: Four times a day (QID) | ORAL | 1 refills | Status: DC | PRN

## 2023-04-12 MED ORDER — DAPAGLIFLOZIN PROPANEDIOL 5 MG PO TABS: 5.0000 mg | ORAL_TABLET | Freq: Every day | ORAL | 1 refills | Status: DC

## 2023-04-12 MED ORDER — URIBEL 118 MG PO CAPS: 118.0000 mg | ORAL_CAPSULE | ORAL | 1 refills | Status: DC | PRN

## 2023-04-12 MED ORDER — METFORMIN HCL 500 MG PO TABS: 1000.0000 mg | ORAL_TABLET | Freq: Two times a day (BID) | ORAL | 1 refills | Status: DC

## 2023-04-12 NOTE — Progress Notes (Signed)
Subjective:  Patient ID: Victoria Schneider, female    DOB: 01-26-1981  Age: 42 y.o. MRN: [id number]  CC: Medical Management of Chronic Issues (Knee pain)   HPI Victoria Schneider is a 42 y.o. year old female patient of Dr. Delford Field with a history of type 2 diabetes mellitus, GERD   Interval History: Discussed the use of AI scribe software for clinical note transcription with the patient, who gave verbal consent to proceed.  She reports home blood glucose readings of 120-130. She is adherent to her metformin and Farxiga regimen and denies hypoglycemia.  A1c is 6.9.   She has not had an eye exam in the past year. She is also on atorvastatin for cholesterol management.    She has numbness in her right foot, which she attributes to a tendon issue related to her right knee pain. She has seen a specialist for her knee pain and has an upcoming appointment. She works in [employer], which exacerbates her knee pain, and she has been prescribed pain medication for this.  She also reports a history of urinary issues and has been referred to a nephrologist due to the presence of 'bacteria in her kidneys'. She has been prescribed medication for this, which she takes as needed for pain.    Past Medical History:  Diagnosis Date   Acute cystitis with hematuria 03/10/2021   Acute cystitis with hematuria 03/10/2021   Elevated LFTs 06/09/2020   GERD (gastroesophageal reflux disease)    Type 2 diabetes mellitus with hyperglycemia, without long-term current use of insulin (HCC) 05/05/2020    Past Surgical History:  Procedure Laterality Date   NO PAST SURGERIES      Family History  Problem Relation Age of Onset   Diabetes Father    Hypertension Father    Diabetes Brother    Hypertension Mother    Depression Daughter     Social History   Socioeconomic History   Marital status: Single    Spouse name: Not on file   Number of children: Not on file   Years of education: Not on file    Highest education level: Not on file  Occupational History   Not on file  Tobacco Use   Smoking status: Never   Smokeless tobacco: Never  Vaping Use   Vaping status: Never Used  Substance and Sexual Activity   Alcohol use: Not Currently    Comment: rarely   Drug use: No   Sexual activity: Yes    Birth control/protection: None  Other Topics Concern   Not on file  Social History Narrative   Not on file   Social Determinants of Health   Financial Resource Strain: Not on file  Food Insecurity: Not on file  Transportation Needs: Not on file  Physical Activity: Not on file  Stress: Not on file  Social Connections: Not on file    Allergies  Allergen Reactions   Trulicity [Dulaglutide] Nausea And Vomiting   Penicillins Rash and Other (See Comments)    Has patient had a PCN reaction causing immediate rash, facial/tongue/throat swelling, SOB or lightheadedness with hypotension: No Has patient had a PCN reaction causing severe rash involving mucus membranes or skin necrosis: No Has patient had a PCN reaction that required hospitalization No Has patient had a PCN reaction occurring within the last 10 years: Yes If all of the above answers are "NO", then may proceed with Cephalosporin use.     Outpatient Medications Prior to Visit  Medication Sig  Dispense Refill   acetaminophen (TYLENOL) 325 MG tablet Take 650 mg by mouth every 6 (six) hours as needed.     atorvastatin (LIPITOR) 20 MG tablet Take 1 tablet (20 mg total) by mouth daily. 90 tablet 3   Blood Glucose Monitoring Suppl (TRUE METRIX METER) w/Device KIT 1 each by Does not apply route in the morning and at bedtime. 1 kit 0   ferrous sulfate 325 (65 FE) MG tablet Take 1 tablet (325 mg total) by mouth daily. 100 tablet 1   fluticasone (FLONASE) 50 MCG/ACT nasal spray PLACE 2 SPRAYS INTO BOTH NOSTRILS DAILY. 16 g 6   glucose blood (TRUE METRIX BLOOD GLUCOSE TEST) test strip USE AS DIRECTED 100 each 0   ibuprofen (ADVIL) 600 MG  tablet Take 1 tablet (600 mg total) by mouth every 6 (six) hours as needed. 90 tablet 1   levocetirizine (XYZAL) 5 MG tablet Take 1 tablet (5 mg total) by mouth every evening. 90 tablet 1   Vitamin D, Ergocalciferol, (DRISDOL) 1.25 MG (50000 UNIT) CAPS capsule Take 1 capsule (50,000 Units total) by mouth every 7 (seven) days. 16 capsule 3   dapagliflozin propanediol (FARXIGA) 5 MG TABS tablet Take 1 tablet (5 mg total) by mouth daily. 30 tablet 5   Meth-Hyo-M Bl-Na Phos-Ph Sal (URIBEL) 118 MG CAPS Take 1 capsule (118 mg total) by mouth as needed. 30 capsule 1   metFORMIN (GLUCOPHAGE) 500 MG tablet Take 2 tablets (1,000 mg total) by mouth daily with breakfast AND 1 tablet (500 mg total) daily with supper. 180 tablet 3   No facility-administered medications prior to visit.     ROS Review of Systems  Constitutional:  Negative for activity change and appetite change.  HENT:  Negative for sinus pressure and sore throat.   Respiratory:  Negative for chest tightness, shortness of breath and wheezing.   Cardiovascular:  Negative for chest pain and palpitations.  Gastrointestinal:  Negative for abdominal distention, abdominal pain and constipation.  Genitourinary: Negative.   Musculoskeletal:        See HPI  Psychiatric/Behavioral:  Negative for behavioral problems and dysphoric mood.     Objective:  BP 111/74   Pulse 79   Ht 5\' 5"  (1.651 m)   Wt 217 lb (98.4 kg)   SpO2 98%   BMI 36.11 kg/m      04/12/2023    9:22 AM 02/27/2023   10:01 AM 01/26/2023   11:28 AM  BP/Weight  Systolic BP 111 108 107  Diastolic BP 74 61 75  Wt. (Lbs) 217  210  BMI 36.11 kg/m2  34.95 kg/m2      Physical Exam Constitutional:      Appearance: She is well-developed.  Cardiovascular:     Rate and Rhythm: Normal rate.     Heart sounds: Normal heart sounds. No murmur heard. Pulmonary:     Effort: Pulmonary effort is normal.     Breath sounds: Normal breath sounds. No wheezing or rales.  Chest:      Chest wall: No tenderness.  Abdominal:     General: Bowel sounds are normal. There is no distension.     Palpations: Abdomen is soft. There is no mass.     Tenderness: There is no abdominal tenderness.  Musculoskeletal:     Right lower leg: Swelling and tenderness present. No edema.     Left lower leg: No swelling or tenderness. No edema.  Neurological:     Mental Status: She is alert and oriented  to person, place, and time.  Psychiatric:        Mood and Affect: Mood normal.        Latest Ref Rng & Units 01/09/2023   11:39 AM 02/28/2022   11:55 AM 08/25/2021   10:25 AM  CMP  Glucose 70 - 99 mg/dL 161  096  045   BUN 6 - 24 mg/dL 6  7  6    Creatinine 0.57 - 1.00 mg/dL 4.09  8.11  9.14   Sodium 134 - 144 mmol/L 141  141  139   Potassium 3.5 - 5.2 mmol/L 5.0  4.2  4.4   Chloride 96 - 106 mmol/L 103  102  102   CO2 20 - 29 mmol/L 25  26  25    Calcium 8.7 - 10.2 mg/dL 9.7  9.7  9.7   Total Protein 6.0 - 8.5 g/dL   7.4   Total Bilirubin 0.0 - 1.2 mg/dL   0.6   Alkaline Phos 44 - 121 IU/L   141   AST 0 - 40 IU/L   22   ALT 0 - 32 IU/L   26     Lipid Panel     Component Value Date/Time   CHOL 213 (H) 09/05/2022 1158   TRIG 91 09/05/2022 1158   HDL 66 09/05/2022 1158   CHOLHDL 3.2 09/05/2022 1158   LDLCALC 131 (H) 09/05/2022 1158    CBC    Component Value Date/Time   WBC 7.8 02/28/2022 1155   WBC 9.7 07/22/2017 0243   RBC 4.97 02/28/2022 1155   RBC 4.79 07/22/2017 0243   HGB 12.3 02/28/2022 1155   HCT 38.8 02/28/2022 1155   PLT 344 02/28/2022 1155   MCV 78 (L) 02/28/2022 1155   MCH 24.7 (L) 02/28/2022 1155   MCH 25.9 (L) 07/22/2017 0243   MCHC 31.7 02/28/2022 1155   MCHC 31.5 07/22/2017 0243   RDW 16.1 (H) 02/28/2022 1155   LYMPHSABS 2.9 02/28/2022 1155   MONOABS 0.9 03/17/2012 2154   EOSABS 0.1 02/28/2022 1155   BASOSABS 0.0 02/28/2022 1155    Lab Results  Component Value Date   HGBA1C 6.9 04/12/2023    Assessment & Plan:      Type 2 Diabetes  Mellitus Well controlled with home glucose readings of 120-130 and A1c of 6.9. No hypoglycemic episodes reported. On Metformin and Farxiga. -Continue current regimen. -Check A1c in 6 months. -Counseled on Diabetic diet, my plate method, 782 minutes of moderate intensity exercise/week Blood sugar logs with fasting goals of 80-120 mg/dl, random of less than 956 and in the event of sugars less than 60 mg/dl or greater than 213 mg/dl encouraged to notify the clinic. Advised on the need for annual eye exams, annual foot exams, Pneumonia vaccine.   Hyperlipidemia On Atorvastatin. -Order fasting lipid panel. -Refill Atorvastatin.  Iron Deficiency Anemia On iron supplementation. Last hemoglobin check was normal in 2023. -Order CBC to check current hemoglobin level. -Continue iron supplementation.  Right Knee Pain Reports pain and swelling, particularly after work. Has seen a specialist and has a follow-up appointment scheduled for November 12. -Advise use of knee brace and over-the-counter ibuprofen as needed for pain. -Contact specialist for possible pain medication refill until follow-up appointment.  Dysuria Reports occasional pain with urination. Has been referred to a nephrologist with an appointment in January. Has been taking an unspecified medication for symptom relief. -Identify and refill the unspecified medication for symptom relief as needed.  General Health Maintenance -Advise annual  diabetic eye exam. -Check cholesterol levels. -Schedule follow-up visit in 6 months.          Meds ordered this encounter  Medications   dapagliflozin propanediol (FARXIGA) 5 MG TABS tablet    Sig: Take 1 tablet (5 mg total) by mouth daily.    Dispense:  90 tablet    Refill:  1   metFORMIN (GLUCOPHAGE) 500 MG tablet    Sig: Take 2 tablets (1,000 mg total) by mouth 2 (two) times daily with a meal.    Dispense:  360 tablet    Refill:  1   Meth-Hyo-M Bl-Na Phos-Ph Sal (URIBEL) 118 MG  CAPS    Sig: Take 1 capsule (118 mg total) by mouth as needed.    Dispense:  30 capsule    Refill:  1    Follow-up: Return in about 6 months (around 10/11/2023) for Chronic medical conditions.       Hoy Register, MD, FAAFP. [facility] [place], [place] [phone]   04/12/2023, 10:10 AM

## 2023-04-12 NOTE — Telephone Encounter (Signed)
Will route to the office for Dr. Alvis Lemmings to address since she just saw the patient today and the medication was sent in today.    Summary: medication clarification   Hawa from [facility] called stated they need to know the frequency for medication Meth-Hyo-M Bl-Na Phos-Ph Sal (URIBEL) 118 MG CAPS. The order needs specify how many times a day patient should take medication for insurance purposes

## 2023-04-12 NOTE — Patient Instructions (Signed)
VISIT SUMMARY:  During today's visit, we reviewed your diabetes management, right knee pain, peripheral neuropathy, hyperlipidemia, iron deficiency anemia, and urinary symptoms. Your diabetes is well controlled, and we discussed continuing your current medications. We also addressed your knee pain, which is being managed with a specialist, and your urinary symptoms, for which you have a nephrology appointment scheduled. Additionally, we talked about your cholesterol and iron levels, and the need for further evaluation of your numbness in the right foot.  YOUR PLAN:  -TYPE 2 DIABETES MELLITUS: Type 2 Diabetes Mellitus is a condition where your body does not use insulin properly, leading to high blood sugar levels. Your diabetes is well controlled with your current medications, Metformin and Farxiga. Please continue your current regimen and we will check your A1c again in 6 months.  -PERIPHERAL NEUROPATHY: Peripheral Neuropathy is a condition that results in numbness, tingling, or pain in the extremities due to nerve damage. We are not sure if your numbness is related to diabetes or another cause, so we are referring you to a neurologist for further evaluation.  -HYPERLIPIDEMIA: Hyperlipidemia is a condition where there are high levels of fats (lipids) in the blood, which can increase the risk of heart disease. You are currently taking Atorvastatin to manage this. We will order a fasting lipid panel to check your cholesterol levels and refill your Atorvastatin prescription.  -IRON DEFICIENCY ANEMIA: Iron Deficiency Anemia is a condition where there is a lack of iron in the body, leading to a reduced number of red blood cells. You are taking iron supplements for this. We will order a CBC to check your current hemoglobin level and continue your iron supplementation.  -RIGHT KNEE PAIN: Right Knee Pain can be caused by various factors, including injury or overuse. You have been experiencing pain and  swelling, especially after work. We recommend using a knee brace and taking over-the-counter ibuprofen as needed. We will also contact your specialist for a possible pain medication refill until your follow-up appointment on November 12.  -URINARY SYMPTOMS: Urinary Symptoms can include pain or discomfort during urination. You have been referred to a nephrologist and have an appointment in January. We will identify and refill the medication you have been taking for symptom relief as needed.  -GENERAL HEALTH MAINTENANCE: For your general health, we recommend an annual diabetic eye exam and regular checks of your cholesterol levels. Please schedule a follow-up visit in 6 months.  INSTRUCTIONS:  Please continue your current diabetes medications and follow up with a neurologist for your peripheral neuropathy. Use a knee brace and over-the-counter ibuprofen for your knee pain, and contact your specialist if you need a pain medication refill before your appointment on November 12. We will order a fasting lipid panel and a CBC to check your cholesterol and hemoglobin levels. Make sure to have an annual diabetic eye exam and schedule a follow-up visit in 6 months.

## 2023-04-13 ENCOUNTER — Other Ambulatory Visit: Payer: Self-pay | Admitting: Family Medicine

## 2023-04-13 DIAGNOSIS — D500 Iron deficiency anemia secondary to blood loss (chronic): Secondary | ICD-10-CM

## 2023-04-13 LAB — CMP14+EGFR
ALT: 39 IU/L — ABNORMAL HIGH (ref 0–32)
AST: 39 IU/L (ref 0–40)
Albumin: 3.8 g/dL — ABNORMAL LOW (ref 3.9–4.9)
Alkaline Phosphatase: 129 IU/L — ABNORMAL HIGH (ref 44–121)
BUN/Creatinine Ratio: 12 (ref 9–23)
BUN: 8 mg/dL (ref 6–24)
Bilirubin Total: 0.7 mg/dL (ref 0.0–1.2)
CO2: 24 mmol/L (ref 20–29)
Calcium: 9.2 mg/dL (ref 8.7–10.2)
Chloride: 103 mmol/L (ref 96–106)
Creatinine, Ser: 0.68 mg/dL (ref 0.57–1.00)
Globulin, Total: 3.1 g/dL (ref 1.5–4.5)
Glucose: 111 mg/dL — ABNORMAL HIGH (ref 70–99)
Potassium: 4.7 mmol/L (ref 3.5–5.2)
Sodium: 140 mmol/L (ref 134–144)
Total Protein: 6.9 g/dL (ref 6.0–8.5)
eGFR: 111 mL/min/1.73 (ref >59)

## 2023-04-13 LAB — CBC WITH DIFFERENTIAL/PLATELET
Basophils Absolute: 0.1 10*3/uL (ref 0.0–0.2)
Basos: 1 %
EOS (ABSOLUTE): 0.1 10*3/uL (ref 0.0–0.4)
Eos: 1 %
Hematocrit: 32.3 % — ABNORMAL LOW (ref 34.0–46.6)
Hemoglobin: 9.1 g/dL — ABNORMAL LOW (ref 11.1–15.9)
Immature Grans (Abs): 0 10*3/uL (ref 0.0–0.1)
Immature Granulocytes: 0 %
Lymphocytes Absolute: 3.4 10*3/uL — ABNORMAL HIGH (ref 0.7–3.1)
Lymphs: 38 %
MCH: 19.9 pg — ABNORMAL LOW (ref 26.6–33.0)
MCHC: 28.2 g/dL — ABNORMAL LOW (ref 31.5–35.7)
MCV: 71 fL — ABNORMAL LOW (ref 79–97)
Monocytes Absolute: 0.8 10*3/uL (ref 0.1–0.9)
Monocytes: 9 %
Neutrophils Absolute: 4.7 10*3/uL (ref 1.4–7.0)
Neutrophils: 51 %
Platelets: 446 10*3/uL (ref 150–450)
RBC: 4.57 x10E6/uL (ref 3.77–5.28)
RDW: 16.4 % — ABNORMAL HIGH (ref 11.7–15.4)
WBC: 9.1 10*3/uL (ref 3.4–10.8)

## 2023-04-13 LAB — LP+NON-HDL CHOLESTEROL
Cholesterol, Total: 153 mg/dL (ref 100–199)
HDL: 77 mg/dL (ref >39)
LDL Chol Calc (NIH): 61 mg/dL (ref 0–99)
Total Non-HDL-Chol (LDL+VLDL): 76 mg/dL (ref 0–129)
Triglycerides: 78 mg/dL (ref 0–149)
VLDL Cholesterol Cal: 15 mg/dL (ref 5–40)

## 2023-04-13 MED ORDER — FERROUS SULFATE 325 (65 FE) MG PO TABS: 325.0000 mg | ORAL_TABLET | Freq: Every day | ORAL | 1 refills | Status: DC

## 2023-07-04 ENCOUNTER — Ambulatory Visit: Payer: Commercial Managed Care - HMO | Admitting: Dermatology

## 2023-09-03 ENCOUNTER — Other Ambulatory Visit: Payer: Self-pay | Admitting: Critical Care Medicine

## 2023-09-03 DIAGNOSIS — E1165 Type 2 diabetes mellitus with hyperglycemia: Secondary | ICD-10-CM

## 2023-09-05 ENCOUNTER — Other Ambulatory Visit (HOSPITAL_COMMUNITY): Payer: Self-pay | Admitting: Medical

## 2023-09-05 ENCOUNTER — Ambulatory Visit (HOSPITAL_COMMUNITY)
Admission: RE | Admit: 2023-09-05 | Discharge: 2023-09-05 | Disposition: A | Discharge status: Home / Self Care | Source: Ambulatory Visit | Attending: Cardiovascular Disease | Admitting: Cardiovascular Disease

## 2023-09-05 DIAGNOSIS — M79604 Pain in right leg: Secondary | ICD-10-CM

## 2023-09-05 DIAGNOSIS — M7989 Other specified soft tissue disorders: Secondary | ICD-10-CM | POA: Insufficient documentation

## 2023-09-20 ENCOUNTER — Other Ambulatory Visit: Payer: Self-pay | Admitting: Critical Care Medicine

## 2023-09-20 ENCOUNTER — Other Ambulatory Visit: Payer: Self-pay | Admitting: Family Medicine

## 2023-09-20 DIAGNOSIS — E1165 Type 2 diabetes mellitus with hyperglycemia: Secondary | ICD-10-CM

## 2023-09-21 NOTE — Telephone Encounter (Signed)
 Requested Prescriptions  Pending Prescriptions Disp Refills   metFORMIN (GLUCOPHAGE) 500 MG tablet [Pharmacy Med Name: metFORMIN HCl 500 MG Oral Tablet] 120 tablet 0    Sig: TAKE 2 TABLETS BY MOUTH TWICE DAILY WITH MEALS     Endocrinology:  Diabetes - Biguanides Failed - 09/21/2023 12:28 PM      Failed - B12 Level in normal range and within 720 days    No results found for: "VITAMINB12"       Passed - Cr in normal range and within 360 days    Creatinine, Ser  Date Value Ref Range Status  04/12/2023 0.68 0.57 - 1.00 mg/dL Final         Passed - HBA1C is between 0 and 7.9 and within 180 days    HbA1c, POC (controlled diabetic range)  Date Value Ref Range Status  04/12/2023 6.9 0.0 - 7.0 % Final         Passed - eGFR in normal range and within 360 days    GFR calc Af Amer  Date Value Ref Range Status  05/05/2020 107 >59 mL/min/1.73 Final    Comment:    **In accordance with recommendations from the NKF-ASN Task force,**   [facility] is in the process of updating its eGFR calculation to the   2021 CKD-EPI creatinine equation that estimates kidney function   without a race variable.    GFR calc non Af Amer  Date Value Ref Range Status  05/05/2020 93 >59 mL/min/1.73 Final   eGFR  Date Value Ref Range Status  04/12/2023 111 >59 mL/min/1.73 Final         Passed - Valid encounter within last 6 months    Recent Outpatient Visits           5 months ago Type 2 diabetes mellitus with hyperglycemia, without long-term current use of insulin (HCC)   [facility] Dept Of [place]. [facility] Hoy Register, MD   8 months ago Type 2 diabetes mellitus with hyperglycemia, without long-term current use of insulin (HCC)   [facility] - A Dept Of [place]. [facility] Storm Frisk, MD   10 months ago Type 2 diabetes mellitus with hyperglycemia, without long-term current use of insulin [facility])   [facility] -  A Dept Of [place]. [facility] [place], [place], [place]   1 year ago Type 2 diabetes mellitus with hyperglycemia, without long-term current use of insulin [facility])   [facility] - A Dept Of [place]. [facility] Storm Frisk, MD   1 year ago Acute cystitis with hematuria   [facility] - A Dept Of [place]. [facility] Storm Frisk, MD              Passed - CBC within normal limits and completed in the last 12 months    WBC  Date Value Ref Range Status  04/12/2023 9.1 3.4 - 10.8 x10E3/uL Final  07/22/2017 9.7 4.0 - 10.5 K/uL Final   RBC  Date Value Ref Range Status  04/12/2023 4.57 3.77 - 5.28 x10E6/uL Final  07/22/2017 4.79 3.87 - 5.11 MIL/uL Final   Hemoglobin  Date Value Ref Range Status  04/12/2023 9.1 (L) 11.1 - 15.9 g/dL Final   Hematocrit  Date Value Ref Range Status  04/12/2023 32.3 (L) 34.0 - 46.6 % Final   MCHC  Date Value Ref Range Status  04/12/2023 28.2 (L) 31.5 - 35.7 g/dL Final  40/98/1191 47.8 30.0 - 36.0 g/dL Final   [facility]  Date Value Ref Range Status  04/12/2023 19.9 (L) 26.6 - 33.0 pg Final  07/22/2017 25.9 (L) 26.0 - 34.0 pg Final   MCV  Date Value Ref Range Status  04/12/2023 71 (L) 79 - 97 fL Final   No results found for: "PLTCOUNTKUC", "LABPLAT", "POCPLA" RDW  Date Value Ref Range Status  04/12/2023 16.4 (H) 11.7 - 15.4 % Final

## 2023-09-28 ENCOUNTER — Other Ambulatory Visit: Payer: Self-pay | Admitting: Urology

## 2023-10-01 NOTE — Progress Notes (Unsigned)
 Established Patient Office Visit  Subjective:  Patient ID: Victoria Schneider, female    DOB: [birth year]  Age: 43 y.o. MRN: [id number]    History of Present Illness:   No chief complaint on file.    HPI 02/2021 GWENETTE WELLONS presents for primary care follow-up visit last seen December 2021.  Patient has been noting periods of blood in the urine without dysuria.  She was seen previously this year in the emergency room for acute cystitis with hematuria.  Treated with a course of antibiotics for E. coli.  Patient does note excess menses with each menstrual period.  Patient does maintain metformin 1000 mg a.m. 500 mg p.m. for type 2 diabetes.  A1c had been controlled previously.  Blood sugar on arrival today 115 patient has also mild iron deficiency anemia from previous lab testing for which she is on iron supplementation. Patient does have mild vitamin D deficiency and is on vitamin D supplement.  Prior history of hepatic steatosis with elevated liver functions for which use of statin was held.  Patient still has significant obesity BMI of nearly 38.  Patient does state her bilateral wrist pain has been improved suspected to be due to carpal tunnel syndrome and doing well with wrist splints at night.  Patient also has hypersomnia with excess snoring.  She was not able to follow through with a referral to sleep medicine.  12/01/21 patient did not need interpreter This is a return visit from prior visit in September 2022.  In the interim the patient was seen in May for a bout of sinusitis from which she has resolved.  She does still have some ear popping and was given Zyrtec-D and Flonase by the PA.  Patient also had a visit in March was found to have hematuria and iron deficiency anemia referred to urology she has never been able to complete the appointment as she did not receive a phone call but they state they tried to call with no answer.  She is also had low back pain and neck  pain that is gotten worse and her weight is up to 215 pounds.  Note in March with her diabetes she was screened again has an A1c of 6.2 and is on the metformin at 1000 mg daily.  She had low vitamin D levels and was recommended to take vitamin D but she is not actually on any supplements at this time.  Today she needs a urine microalbumin assessed she needs a Pap smear foot exam and needs to get her eye exam performed  Patient still notes some blood in the urine and has occasional vaginal discharge.  There are no other complaints.  She is due a pneumonia vaccine she agrees to receive at this visit.   Below is assessment from the PA visit in March McClung 08/2021   1. Type 2 diabetes mellitus with hyperglycemia, without long-term current use of insulin (HCC) Controlled on the dose she is actually taking which I will send prescription she is currently taking it now - Glucose (CBG) - HgB A1c - POCT URINALYSIS DIP (CLINITEK) - Comprehensive metabolic panel - metFORMIN (GLUCOPHAGE) 500 MG tablet; 2 tabs daily with dinner  Dispense: 60 tablet; Refill: 3   2. Other microscopic hematuria No infection-increase water intake to 80-100 ounces water daily - POCT URINALYSIS DIP (CLINITEK) - Ambulatory referral to Urology   3. Dysfunction of both eustachian tubes Flonase sent - fluticasone (FLONASE) 50 MCG/ACT nasal spray; PLACE  2 SPRAYS INTO BOTH NOSTRILS DAILY.  Dispense: 16 g; Refill: 6   4. Iron deficiency anemia due to chronic blood loss - ferrous sulfate 325 (65 FE) MG tablet; Take 1 tablet (325 mg total) by mouth daily.  Dispense: 100 tablet; Refill: 1 - CBC with Differential/Platelet   6. Allergy, subsequent encounter - cetirizine-pseudoephedrine (ZYRTEC-D) 5-120 MG tablet; Take 1 tablet by mouth 2 (two) times daily. prn  Dispense: 60 tablet; Refill: 1 - fluticasone (FLONASE) 50 MCG/ACT nasal spray; PLACE 2 SPRAYS INTO BOTH NOSTRILS DAILY.  Dispense: 16 g; Refill: 6   7. Vitamin D  deficiency - Vitamin D, 25-hydroxy   8. Chronic right-sided low back pain without sciatica - ibuprofen (ADVIL) 800 MG tablet; Take 1 tablet (800 mg total) by mouth 3 (three) times daily. Prn pain  Dispense: 30 tablet; Refill: 0 - methocarbamol (ROBAXIN) 500 MG tablet; Take 2 tablets (1,000 mg total) by mouth every 8 (eight) hours as needed for muscle spasms.  Dispense: 90 tablet; Refill: 0       The patient never received her Pap smear never got the visit with gynecology.  9/11 This patient is seen in return follow-up by way of a phone visit she has had continued flank pain bilaterally and was seen previously this year for blood in the urine by urology in [place].  Cystoscopy was done was normal repeat urinalysis showed blood in the urine but urine cultures were negative she had a visit here in our clinic recently had urine cultures done which showed multiple organisms which suggested repeat collection.  She is yet to have a CT renal study but she did have a renal ultrasound that was unremarkable did not show hydronephrosis.  Patient was not able to come to the office today and instead was set up for a virtual visit.  She still having flank pain.  She has been to urgent care more recently.  She was given Bactrim and this helped to some degree but her symptoms have recurred.  9/28 Patient returns from the last visit that was a phone visit and September of this month.  She still having low back pain but it is less.  She denies any blood in the urine at this time.  She is yet to achieve the lumbosacral spine x-ray. Urinalysis previously has shown some hematuria.  She had a CT renal study which was negative for stones or abnormalities in the kidneys.  She continues to eat a lot of rice tortillas in her diet.  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  No other complaints.  11/30 Patient returns still having difficulty with dysuria and hematuria and had been to the emergency  room where she was evaluated had blood in the urine but urine culture showed multiple species contaminated.  Note she declines flu vaccine and blood pressure is good at this visit.  Patient does have lower abdominal pain with current symptom complex she was referred to urology but they declined to see her because she has [place] insurance  09/05/22 Patient seen in return follow-up from prior visit in November of last year.  On arrival A1c is 7 blood sugar 174.  She was not able to tolerate Trulicity is now off this.  She does maintain metformin daily.  She would like refill on her high-dose ibuprofen for menstrual cramps.  She did have an eye exam 6 months ago at the [facility] and will get need to get records.  Last menstrual period 2 weeks ago.  She is not interested in birth control medication.  She states the menstrual cramps are mild.  Patient did have cystitis and hematuria she saw urology they did not feel like anything further was needed other than institution of Uribel taking as needed the patient would like refill on ibuprofen   below is documentation from last urology visit in December . Microhematuria She has had a recent renal ultrasound, CT abdomen/pelvis and cystoscopy and does not need further evaluation for microhematuria unless she has a significant increase in the amount of her hematuria or development of gross hematuria.  Previous UA at time of cystoscopy 12/2021 with 11-30 RBCs UA today with 3-10 RBCs however there were >10 epis indicating a contaminated specimen and not valid We discussed the high false-negative rate of dipstick positive readings for hematuria and recommend sending UAs for microscopic examination if they show positive blood on dipstick   2.  Lower urinary tract symptoms Intermittent episodes of pain at the end of urination, frequency and urgency.  Urine cultures have been negative We discussed possibility of dietary triggers and she was provided literature on  foods and beverages to avoid Rx Uribel 4 times daily as needed for symptom onset Recommend she contact our office for an acute visit if symptoms recur Follow-up visit 3 months    7/23 This patient is seen in return follow-up she had seen urology for hematuria and dysuria.  She was given oral medication to take as needed for dysuria.  Cystoscopy was unremarkable.  She would like a second opinion urology referral.  She continues to remain dizzy with severe bitemporal headaches she had head trauma previously she has no nausea or vomiting but sees visual changes.  She did go to [facility] for a retinal exam with her diabetes it was reportedly normal we will obtain this study.  Patient still has left shoulder and lower back pain Past Medical History:  Diagnosis Date   Acute cystitis with hematuria 03/10/2021   Acute cystitis with hematuria 03/10/2021   Elevated LFTs 06/09/2020   GERD (gastroesophageal reflux disease)    Type 2 diabetes mellitus with hyperglycemia, without long-term current use of insulin (HCC) 05/05/2020    Past Surgical History:  Procedure Laterality Date   NO PAST SURGERIES      Family History  Problem Relation Age of Onset   Diabetes Father    Hypertension Father    Diabetes Brother    Hypertension Mother    Depression Daughter     Social History   Socioeconomic History   Marital status: Single    Spouse name: Not on file   Number of children: Not on file   Years of education: Not on file   Highest education level: Not on file  Occupational History   Not on file  Tobacco Use   Smoking status: Never   Smokeless tobacco: Never  Vaping Use   Vaping status: Never Used  Substance and Sexual Activity   Alcohol use: Not Currently    Comment: rarely   Drug use: No   Sexual activity: Yes    Birth control/protection: None  Other Topics Concern   Not on file  Social History Narrative   Not on file   Social Drivers of Health   Financial Resource  Strain: Not on file  Food Insecurity: Not on file  Transportation Needs: Not on file  Physical Activity: Not on file  Stress: Not on file  Social  Connections: Not on file  Intimate Partner Violence: Not on file    Outpatient Medications Prior to Visit  Medication Sig Dispense Refill   acetaminophen (TYLENOL) 325 MG tablet Take 650 mg by mouth every 6 (six) hours as needed.     atorvastatin (LIPITOR) 20 MG tablet Take 1 tablet (20 mg total) by mouth daily. 90 tablet 3   Blood Glucose Monitoring Suppl (TRUE METRIX METER) w/Device KIT 1 each by Does not apply route in the morning and at bedtime. 1 kit 0   dapagliflozin propanediol (FARXIGA) 5 MG TABS tablet Take 1 tablet (5 mg total) by mouth daily. 90 tablet 1   ferrous sulfate 325 (65 FE) MG tablet Take 1 tablet (325 mg total) by mouth daily. 100 tablet 1   fluticasone (FLONASE) 50 MCG/ACT nasal spray PLACE 2 SPRAYS INTO BOTH NOSTRILS DAILY. 16 g 6   ibuprofen (ADVIL) 600 MG tablet Take 1 tablet (600 mg total) by mouth every 6 (six) hours as needed. 90 tablet 1   levocetirizine (XYZAL) 5 MG tablet Take 1 tablet (5 mg total) by mouth every evening. 90 tablet 1   metFORMIN (GLUCOPHAGE) 500 MG tablet TAKE 2 TABLETS BY MOUTH TWICE DAILY WITH MEALS 120 tablet 0   Meth-Hyo-M Bl-Na Phos-Ph Sal (URO-MP) 118 MG CAPS TAKE 1 CAPSULE BY MOUTH EVERY 8 HOURS AS NEEDED 30 capsule 0   TRUE METRIX BLOOD GLUCOSE TEST test strip USE AS DIRECTED 100 each 0   Vitamin D, Ergocalciferol, (DRISDOL) 1.25 MG (50000 UNIT) CAPS capsule TAKE 1 CAPSULE BY MOUTH EVERY 7 DAYS 4 capsule 0   No facility-administered medications prior to visit.    Allergies  Allergen Reactions   Trulicity [Dulaglutide] Nausea And Vomiting   Penicillins Rash and Other (See Comments)    Has patient had a PCN reaction causing immediate rash, facial/tongue/throat swelling, SOB or lightheadedness with hypotension: No Has patient had a PCN reaction causing severe rash involving mucus  membranes or skin necrosis: No Has patient had a PCN reaction that required hospitalization No Has patient had a PCN reaction occurring within the last 10 years: Yes If all of the above answers are "NO", then may proceed with Cephalosporin use.     ROS Review of Systems  Constitutional:  Negative for chills, diaphoresis and fever.  HENT:  Negative for congestion, hearing loss, nosebleeds, sore throat and tinnitus.   Eyes:  Negative for photophobia and redness.  Respiratory:  Negative for cough, shortness of breath, wheezing and stridor.   Cardiovascular:  Negative for chest pain, palpitations and leg swelling.  Gastrointestinal:  Negative for abdominal pain, blood in stool, constipation, diarrhea, nausea and vomiting.  Endocrine: Negative for polydipsia.  Genitourinary:  Negative for dysuria, flank pain, frequency, hematuria, menstrual problem, pelvic pain and urgency.  Musculoskeletal:  Positive for back pain. Negative for myalgias and neck pain.  Skin:  Negative for rash.  Allergic/Immunologic: Negative for environmental allergies.  Neurological:  Negative for dizziness, tremors, seizures, weakness and headaches.  Hematological:  Does not bruise/bleed easily.  Psychiatric/Behavioral:  Negative for suicidal ideas. The patient is not nervous/anxious.       Objective:    There were no vitals filed for this visit.    Gen: Pleasant, well-nourished, in no distress,  normal affect  ENT: No lesions,  mouth clear,  oropharynx clear, no postnasal drip  Neck: No JVD, no TMG, no carotid bruits  Lungs: No use of accessory muscles, no dullness to percussion, clear without rales or rhonchi  Cardiovascular: RRR, heart sounds normal, no murmur or gallops, no peripheral edema  Abdomen: Suprapubic tenderness no HSM,  BS normal  Musculoskeletal: No deformities, no cyanosis or clubbing foot exam normal  Neuro: alert, non focal  Skin: Warm, no lesions or rashes  No results  found.   There were no vitals taken for this visit. Wt Readings from Last 3 Encounters:  04/12/23 217 lb (98.4 kg)  01/26/23 210 lb (95.3 kg)  01/09/23 213 lb 12.8 oz (97 kg)   There were no vitals filed for this visit.    Health Maintenance Due  Topic Date Due   OPHTHALMOLOGY EXAM  Never done   DTaP/Tdap/Td (2 - Td or Tdap) 10/03/2022    There are no preventive care reminders to display for this patient.  Lab Results  Component Value Date   TSH 2.100 11/02/2022   Lab Results  Component Value Date   WBC 9.1 04/12/2023   HGB 9.1 (L) 04/12/2023   HCT 32.3 (L) 04/12/2023   MCV 71 (L) 04/12/2023   PLT 446 04/12/2023   Lab Results  Component Value Date   NA 140 04/12/2023   K 4.7 04/12/2023   CO2 24 04/12/2023   GLUCOSE 111 (H) 04/12/2023   BUN 8 04/12/2023   CREATININE 0.68 04/12/2023   BILITOT 0.7 04/12/2023   ALKPHOS 129 (H) 04/12/2023   AST 39 04/12/2023   ALT 39 (H) 04/12/2023   PROT 6.9 04/12/2023   ALBUMIN 3.8 (L) 04/12/2023   CALCIUM 9.2 04/12/2023   ANIONGAP 9 07/22/2017   EGFR 111 04/12/2023   Lab Results  Component Value Date   CHOL 153 04/12/2023   Lab Results  Component Value Date   HDL 77 04/12/2023   Lab Results  Component Value Date   LDLCALC 61 04/12/2023   Lab Results  Component Value Date   TRIG 78 04/12/2023   Lab Results  Component Value Date   CHOLHDL 3.2 09/05/2022   Lab Results  Component Value Date   HGBA1C 6.9 04/12/2023      Assessment & Plan:   Problem List Items Addressed This Visit    I personally reviewed all images and lab data in the [facility] system as well as any outside material available during this office visit and agree with the  radiology impressions.   No problem-specific Assessment & Plan notes found for this encounter.    There are no diagnoses linked to this encounter.   Return follow-up 4 months for diabetes Arlene Lacy, MD

## 2023-10-04 ENCOUNTER — Other Ambulatory Visit: Payer: Self-pay | Admitting: Pharmacist

## 2023-10-04 ENCOUNTER — Encounter: Payer: Self-pay | Admitting: Critical Care Medicine

## 2023-10-04 ENCOUNTER — Other Ambulatory Visit: Payer: Self-pay

## 2023-10-04 ENCOUNTER — Ambulatory Visit: Attending: Critical Care Medicine | Admitting: Critical Care Medicine

## 2023-10-04 VITALS — BP 116/76 | HR 81 | Resp 19 | Ht 65.0 in | Wt 216.0 lb

## 2023-10-04 DIAGNOSIS — E785 Hyperlipidemia, unspecified: Secondary | ICD-10-CM

## 2023-10-04 DIAGNOSIS — E1165 Type 2 diabetes mellitus with hyperglycemia: Secondary | ICD-10-CM | POA: Diagnosis not present

## 2023-10-04 DIAGNOSIS — J302 Other seasonal allergic rhinitis: Secondary | ICD-10-CM

## 2023-10-04 DIAGNOSIS — Z7984 Long term (current) use of oral hypoglycemic drugs: Secondary | ICD-10-CM

## 2023-10-04 DIAGNOSIS — E1169 Type 2 diabetes mellitus with other specified complication: Secondary | ICD-10-CM

## 2023-10-04 DIAGNOSIS — E119 Type 2 diabetes mellitus without complications: Secondary | ICD-10-CM

## 2023-10-04 DIAGNOSIS — D500 Iron deficiency anemia secondary to blood loss (chronic): Secondary | ICD-10-CM

## 2023-10-04 DIAGNOSIS — E559 Vitamin D deficiency, unspecified: Secondary | ICD-10-CM

## 2023-10-04 DIAGNOSIS — T7840XD Allergy, unspecified, subsequent encounter: Secondary | ICD-10-CM

## 2023-10-04 DIAGNOSIS — H6992 Unspecified Eustachian tube disorder, left ear: Secondary | ICD-10-CM

## 2023-10-04 LAB — POCT GLYCOSYLATED HEMOGLOBIN (HGB A1C): HbA1c, POC (controlled diabetic range): 6.7 % (ref 0.0–7.0)

## 2023-10-04 LAB — GLUCOSE, POCT (MANUAL RESULT ENTRY): POC Glucose: 125 mg/dL — AB (ref 70–99)

## 2023-10-04 MED ORDER — TRUE METRIX BLOOD GLUCOSE TEST VI STRP
100.0000 | ORAL_STRIP | 0 refills | Status: DC
  Filled 2023-10-04: qty 100, 30d supply, fill #0

## 2023-10-04 MED ORDER — DAPAGLIFLOZIN PROPANEDIOL 5 MG PO TABS
5.0000 mg | ORAL_TABLET | Freq: Every day | ORAL | 1 refills | Status: DC
  Filled 2023-10-04: qty 30, 30d supply, fill #0

## 2023-10-04 MED ORDER — FLUTICASONE PROPIONATE 50 MCG/ACT NA SUSP
2.0000 | Freq: Every day | NASAL | 3 refills | Status: AC
  Filled 2023-10-04: qty 16, 30d supply, fill #0

## 2023-10-04 MED ORDER — VITAMIN D (ERGOCALCIFEROL) 1.25 MG (50000 UNIT) PO CAPS
50000.0000 [IU] | ORAL_CAPSULE | ORAL | 3 refills | Status: DC
  Filled 2023-10-04: qty 4, 28d supply, fill #0
  Filled 2023-12-20: qty 4, 28d supply, fill #1
  Filled 2024-02-12 – 2024-03-05 (×2): qty 4, 28d supply, fill #2
  Filled 2024-04-08: qty 4, 28d supply, fill #3

## 2023-10-04 MED ORDER — FARXIGA 5 MG PO TABS
5.0000 mg | ORAL_TABLET | Freq: Every day | ORAL | 1 refills | Status: AC
  Filled 2023-10-04: qty 30, 30d supply, fill #0

## 2023-10-04 MED ORDER — METFORMIN HCL 1000 MG PO TABS
1000.0000 mg | ORAL_TABLET | Freq: Two times a day (BID) | ORAL | 1 refills | Status: AC
Start: 2023-10-04 — End: ?
  Filled 2023-10-04: qty 60, 30d supply, fill #0
  Filled 2023-12-20: qty 60, 30d supply, fill #1
  Filled 2024-02-12 – 2024-03-05 (×2): qty 60, 30d supply, fill #2
  Filled 2024-05-09 (×2): qty 60, 30d supply, fill #3
  Filled 2024-06-26: qty 60, 30d supply, fill #4

## 2023-10-04 MED ORDER — ATORVASTATIN CALCIUM 20 MG PO TABS
20.0000 mg | ORAL_TABLET | Freq: Every day | ORAL | 3 refills | Status: AC
  Filled 2023-10-04: qty 30, 30d supply, fill #0
  Filled 2024-03-05: qty 30, 30d supply, fill #1
  Filled 2024-05-09 (×2): qty 30, 30d supply, fill #2
  Filled 2024-06-26: qty 30, 30d supply, fill #3

## 2023-10-04 MED ORDER — LEVOCETIRIZINE DIHYDROCHLORIDE 5 MG PO TABS
5.0000 mg | ORAL_TABLET | Freq: Every evening | ORAL | 1 refills | Status: AC
  Filled 2023-10-04: qty 30, 30d supply, fill #0
  Filled 2024-03-05: qty 30, 30d supply, fill #1

## 2023-10-04 MED ORDER — IBUPROFEN 600 MG PO TABS
600.0000 mg | ORAL_TABLET | Freq: Four times a day (QID) | ORAL | 1 refills | Status: DC | PRN
  Filled 2023-10-04: qty 90, 23d supply, fill #0

## 2023-10-04 NOTE — Assessment & Plan Note (Addendum)
Continue vitamin D and check vitamin D level.

## 2023-10-04 NOTE — Assessment & Plan Note (Signed)
 Continue iron supplement.

## 2023-10-04 NOTE — Assessment & Plan Note (Signed)
 Check lipid panel and continue with atorvastatin

## 2023-10-04 NOTE — Assessment & Plan Note (Addendum)
 Continue Farxiga 5 mg daily and metformin to 1000 mg twice daily will obtain labs today and encouraged to get eye exam

## 2023-10-04 NOTE — Patient Instructions (Signed)
 Medications refilled  Vit D level today Return 6 months new primary care

## 2023-10-05 LAB — VITAMIN D 25 HYDROXY (VIT D DEFICIENCY, FRACTURES): Vit D, 25-Hydroxy: 33.3 ng/mL (ref 30.0–100.0)

## 2023-10-05 NOTE — Progress Notes (Signed)
 Let patient know vitamin D  levels are normal no change in dosing of vitamin D 

## 2023-10-09 ENCOUNTER — Other Ambulatory Visit: Payer: Self-pay

## 2023-10-09 NOTE — Telephone Encounter (Unsigned)
 Copied from CRM [id number]. Topic: Clinical - Lab/Test Results >> Oct 09, 2023  3:32 PM Baldomero Bone wrote: Reason for CRM: Patient is calling about lab results for surgery on 10/17/2023. Patient is concerned iron level. Patient is advised vitamin d  level per note from Dr Brent Cambric. Callback number is [phone]

## 2023-10-18 HISTORY — PX: KNEE SURGERY: SHX244

## 2023-10-26 ENCOUNTER — Other Ambulatory Visit (HOSPITAL_COMMUNITY): Payer: Self-pay | Admitting: Orthopedic Surgery

## 2023-10-26 ENCOUNTER — Ambulatory Visit (HOSPITAL_COMMUNITY)
Admission: RE | Admit: 2023-10-26 | Discharge: 2023-10-26 | Disposition: A | Discharge status: Home / Self Care | Source: Ambulatory Visit | Attending: Orthopedic Surgery | Admitting: Orthopedic Surgery

## 2023-10-26 DIAGNOSIS — R609 Edema, unspecified: Secondary | ICD-10-CM

## 2023-10-26 NOTE — Progress Notes (Signed)
 Lower extremity venous duplex completed. Please see CV Procedures for preliminary results.  Estanislao Heimlich, RVT 10/26/23 11:32 AM

## 2023-12-20 ENCOUNTER — Other Ambulatory Visit: Payer: Self-pay

## 2024-02-12 ENCOUNTER — Other Ambulatory Visit: Payer: Self-pay

## 2024-02-22 ENCOUNTER — Other Ambulatory Visit: Payer: Self-pay

## 2024-02-28 ENCOUNTER — Ambulatory Visit: Payer: Self-pay

## 2024-02-28 NOTE — Telephone Encounter (Signed)
Noted! Pt has been scheduled 

## 2024-02-28 NOTE — Telephone Encounter (Signed)
 FYI Only or Action Required?: FYI only for provider.  Patient was last seen in primary care on 10/04/2023 by Brien Belvie BRAVO, MD.  Called Nurse Triage reporting Leg Swelling.  Symptoms began about a month ago.  Interventions attempted: Nothing.  Symptoms are: unchanged.  Triage Disposition: See PCP When Office is Open (Within 3 Days)  Patient/caregiver understands and will follow disposition?: Yes  LVM for patient giving address of location. [address] [place], [address]   Copied from CRM #[id number]. Topic: Clinical - Red Word Triage >> Feb 28, 2024 11:10 AM Mia F wrote: Red Word that prompted transfer to Nurse Triage: Bone pain and swollen hands. Pt says her back and legs has been hurting. Legs are also swollen (both). Has been going on for over a month. Tingling when swelling starts. No other symptoms. Been going on for a month Reason for Disposition  [1] MILD swelling (puffiness) of both hands AND [2] not better after 3 days  Answer Assessment - Initial Assessment Questions 1. ONSET: When did the swelling start? (e.g., minutes, hours, days)     One month 2. LOCATION: What part of the hand is swollen?  Are both hands swollen or just one hand?     Both hands are swollen 3. TYPE : What does it look like? (e.g., ball, lump; localized; hand swelling)     4. SWELLING SEVERITY: If more than a lump or localized, ask: How bad is the hand swelling? (e.g., mild, moderate, severe; describe)     Becomes tingly and painful 5. REDNESS: Is there redness or signs of infection?     denies 6. PAIN: Is the swelling painful to touch? If Yes, ask: How painful is it?   (Scale 1-10; mild, moderate or severe)     8/10 7. FEVER: Do you have a fever? If Yes, ask: What is it, how was it measured, and when did it start?      denies 8. CAUSE: What do you think is causing the hand swelling? (e.g., heat, insect bite, pregnancy, recent injury)     unsure 9. MEDICAL HISTORY: Do  you have a history of heart failure, kidney disease, liver failure, or cancer?     denies 10. RECURRENT SYMPTOM: Have you had hand swelling before? If Yes, ask: When was the last time? What happened that time?       denies 91. OTHER SYMPTOMS: Do you have any other symptoms? (e.g., blurred vision, difficulty breathing, headache)       Body aches, foot swelling from being up on feet all day 12. PREGNANCY: Is there any chance you are pregnant? When was your last menstrual period?       N/A  Protocols used: Hand Swelling-A-AH

## 2024-02-29 ENCOUNTER — Encounter: Payer: Self-pay | Admitting: Nurse Practitioner

## 2024-02-29 ENCOUNTER — Ambulatory Visit (INDEPENDENT_AMBULATORY_CARE_PROVIDER_SITE_OTHER): Payer: Self-pay | Admitting: Nurse Practitioner

## 2024-02-29 VITALS — BP 114/56 | HR 84 | Temp 97.2°F | Wt 212.0 lb

## 2024-02-29 DIAGNOSIS — N920 Excessive and frequent menstruation with regular cycle: Secondary | ICD-10-CM | POA: Diagnosis not present

## 2024-02-29 DIAGNOSIS — D500 Iron deficiency anemia secondary to blood loss (chronic): Secondary | ICD-10-CM | POA: Diagnosis not present

## 2024-02-29 DIAGNOSIS — M2550 Pain in unspecified joint: Secondary | ICD-10-CM | POA: Diagnosis not present

## 2024-02-29 NOTE — Assessment & Plan Note (Signed)
  Iron deficiency anemia with menorrhagia Heavy menstrual bleeding with regular cycles. Previous imaging inconclusive. Possible fibroids. Bleeding during urination evaluated by specialists without definitive cause. - Order pelvic ultrasound to check for fibroids. - Order labs to assess current blood count. - Maintain follow-up appointments with nephrologist and urologist.

## 2024-02-29 NOTE — Patient Instructions (Signed)

## 2024-02-29 NOTE — Progress Notes (Signed)
 Acute Office Visit  Subjective:     Patient ID: Victoria Schneider, female    DOB: 1980/12/27, 43 y.o.   MRN: [id number]  Chief Complaint  Patient presents with   Hand Pain    Both with swelling. Per pt pain in bones. Started a month ago     HPI    Discussed the use of AI scribe software for clinical note transcription with the patient, who gave verbal consent to proceed.  History of Present Illness Victoria Schneider is a 43 year old female  has a past medical history of Acute cystitis with hematuria (03/10/2021), Acute cystitis with hematuria (03/10/2021), Elevated LFTs (06/09/2020), GERD (gastroesophageal reflux disease), and Type 2 diabetes mellitus with hyperglycemia, without long-term current use of insulin (HCC) (05/05/2020).  osteoarthritis and anemia who presents with diffuse bone pain.  Patient of Dr. Brien,,   She has been experiencing diffuse bone pain for the past month, describing it as affecting 'all my bones' and rating it as an 8 out of 10 in severity. She has been taking ibuprofen  and Tylenol  with minimal relief. No previous episodes of similar pain have been noted.  She previously had pain in her right knee but now reports pain in all her joints. Additionally, she notes swelling in her hands, which she has experienced before, but emphasizes that her current concern is the widespread bone pain.  She has a history of anemia and takes ferrous sulfate  325 mg daily, though inconsistently. She reports heavy menstrual bleeding, using about four pads per day, with periods lasting six to seven days. She has seen a nephrologist and a urologist for blood in her urine, but no definitive cause was identified.   No fever, chills, chest pain, or shortness of breath. She felt unwell two days ago, leading her to leave work early. She experiences occasional coughing and sneezing.   Assessment & Plan   Review of Systems  Constitutional:  Negative for appetite change,  chills, fatigue and fever.  HENT:  Negative for congestion, postnasal drip, rhinorrhea and sneezing.   Respiratory:  Negative for cough, shortness of breath and wheezing.   Cardiovascular:  Negative for chest pain, palpitations and leg swelling.  Gastrointestinal:  Negative for abdominal pain, constipation, nausea and vomiting.  Genitourinary:  Negative for difficulty urinating, dysuria, flank pain and frequency.  Musculoskeletal:  Positive for arthralgias and joint swelling. Negative for back pain and myalgias.  Skin:  Negative for color change, pallor, rash and wound.  Neurological:  Negative for dizziness, facial asymmetry, weakness, numbness and headaches.  Psychiatric/Behavioral:  Negative for behavioral problems, confusion, self-injury and suicidal ideas.         Objective:    BP (!) 114/56   Pulse 84   Temp (!) 97.2 F (36.2 C)   Wt 212 lb (96.2 kg)   SpO2 99%   BMI 35.28 kg/m    Physical Exam Vitals and nursing note reviewed.  Constitutional:      General: She is not in acute distress.    Appearance: Normal appearance. She is obese. She is not ill-appearing, toxic-appearing or diaphoretic.  Eyes:     General: No scleral icterus.       Right eye: No discharge.        Left eye: No discharge.     Extraocular Movements: Extraocular movements intact.     Conjunctiva/sclera: Conjunctivae normal.  Cardiovascular:     Rate and Rhythm: Normal rate and regular rhythm.     Pulses:  Normal pulses.     Heart sounds: Normal heart sounds. No murmur heard.    No friction rub. No gallop.  Pulmonary:     Effort: Pulmonary effort is normal. No respiratory distress.     Breath sounds: Normal breath sounds. No stridor. No wheezing, rhonchi or rales.  Chest:     Chest wall: No tenderness.  Abdominal:     General: There is no distension.     Palpations: Abdomen is soft.     Tenderness: There is no abdominal tenderness. There is no right CVA tenderness, left CVA tenderness or  guarding.  Musculoskeletal:        General: Tenderness present. No swelling, deformity or signs of injury.     Right lower leg: No edema.     Left lower leg: No edema.     Comments: Multiple joints tenderness on palpation and range of motion, skin is warm and dry no redness noted  Skin:    General: Skin is warm and dry.     Capillary Refill: Capillary refill takes less than 2 seconds.     Coloration: Skin is not jaundiced or pale.     Findings: No bruising, erythema or lesion.  Neurological:     Mental Status: She is alert and oriented to person, place, and time.     Motor: No weakness.     Coordination: Coordination normal.     Gait: Gait normal.  Psychiatric:        Mood and Affect: Mood normal.        Behavior: Behavior normal.        Thought Content: Thought content normal.        Judgment: Judgment normal.     No results found for any visits on 02/29/24.      Assessment & Plan:   Problem List Items Addressed This Visit       Other   Menorrhagia with regular cycle    Iron deficiency anemia with menorrhagia Heavy menstrual bleeding with regular cycles. Previous imaging inconclusive. Possible fibroids. Bleeding during urination evaluated by specialists without definitive cause. - Order pelvic ultrasound to check for fibroids. - Order labs to assess current blood count. - Maintain follow-up appointments with nephrologist and urologist.      Relevant Orders   US  PELVIC COMPLETE WITH TRANSVAGINAL   Iron deficiency anemia due to chronic blood loss   Lab Results  Component Value Date   WBC 9.1 04/12/2023   HGB 9.1 (L) 04/12/2023   HCT 32.3 (L) 04/12/2023   MCV 71 (L) 04/12/2023   PLT 446 04/12/2023    Heavy menstrual bleeding with regular cycles. Previous imaging inconclusive. Possible fibroids. Bleeding during urination evaluated by specialists without definitive cause. - Order pelvic ultrasound to check for fibroids. - Order labs to assess current blood  count. - Maintain follow-up appointments with nephrologist and urologist.      Relevant Orders   CBC   Arthralgia of multiple joints - Primary   Generalized bone and joint pain Pain rated 8/10, not relieved by ibuprofen  or Tylenol . No fever, chills, or respiratory symptoms. Pain exacerbated by movement, with hand swelling. - Order labs to investigate potential causes of pain. - Continue Tylenol  650 mg every 6 hours as needed. - Continue ibuprofen  600 mg every 6 hours as needed.      Relevant Orders   CBC   Sedimentation Rate   ANA   C-reactive protein   Rheumatoid factor   Basic Metabolic Panel  No orders of the defined types were placed in this encounter.   No follow-ups on file.  Kristi Norment R Jorah Hua, FNP

## 2024-02-29 NOTE — Assessment & Plan Note (Signed)
 Generalized bone and joint pain Pain rated 8/10, not relieved by ibuprofen  or Tylenol . No fever, chills, or respiratory symptoms. Pain exacerbated by movement, with hand swelling. - Order labs to investigate potential causes of pain. - Continue Tylenol  650 mg every 6 hours as needed. - Continue ibuprofen  600 mg every 6 hours as needed.

## 2024-02-29 NOTE — Assessment & Plan Note (Signed)
 Lab Results  Component Value Date   WBC 9.1 04/12/2023   HGB 9.1 (L) 04/12/2023   HCT 32.3 (L) 04/12/2023   MCV 71 (L) 04/12/2023   PLT 446 04/12/2023    Heavy menstrual bleeding with regular cycles. Previous imaging inconclusive. Possible fibroids. Bleeding during urination evaluated by specialists without definitive cause. - Order pelvic ultrasound to check for fibroids. - Order labs to assess current blood count. - Maintain follow-up appointments with nephrologist and urologist.

## 2024-03-01 LAB — RHEUMATOID FACTOR: Rheumatoid fact SerPl-aCnc: <10 [IU]/mL (ref <14.0)

## 2024-03-02 LAB — BASIC METABOLIC PANEL WITH GFR
BUN/Creatinine Ratio: 7 — ABNORMAL LOW (ref 9–23)
BUN: 4 mg/dL — ABNORMAL LOW (ref 6–24)
CO2: 23 mmol/L (ref 20–29)
Calcium: 8.9 mg/dL (ref 8.7–10.2)
Chloride: 106 mmol/L (ref 96–106)
Creatinine, Ser: 0.56 mg/dL — ABNORMAL LOW (ref 0.57–1.00)
Glucose: 137 mg/dL — ABNORMAL HIGH (ref 70–99)
Potassium: 4.2 mmol/L (ref 3.5–5.2)
Sodium: 141 mmol/L (ref 134–144)
eGFR: 115 mL/min/1.73 (ref >59)

## 2024-03-02 LAB — CBC
Hematocrit: 29.9 % — ABNORMAL LOW (ref 34.0–46.6)
Hemoglobin: 8.1 g/dL — ABNORMAL LOW (ref 11.1–15.9)
MCH: 18.3 pg — ABNORMAL LOW (ref 26.6–33.0)
MCHC: 27.1 g/dL — ABNORMAL LOW (ref 31.5–35.7)
MCV: 68 fL — ABNORMAL LOW (ref 79–97)
Platelets: 462 x10E3/uL — ABNORMAL HIGH (ref 150–450)
RBC: 4.42 x10E6/uL (ref 3.77–5.28)
RDW: 17.9 % — ABNORMAL HIGH (ref 11.7–15.4)
WBC: 7.4 x10E3/uL (ref 3.4–10.8)

## 2024-03-02 LAB — SEDIMENTATION RATE: Sed Rate: 57 mm/h — ABNORMAL HIGH (ref 0–32)

## 2024-03-02 LAB — ANA: Anti Nuclear Antibody (ANA): POSITIVE — AB

## 2024-03-02 LAB — C-REACTIVE PROTEIN: CRP: 6 mg/L (ref 0–10)

## 2024-03-03 ENCOUNTER — Other Ambulatory Visit: Payer: Self-pay | Admitting: Nurse Practitioner

## 2024-03-03 ENCOUNTER — Ambulatory Visit: Payer: Self-pay | Admitting: Nurse Practitioner

## 2024-03-03 ENCOUNTER — Telehealth: Payer: Self-pay

## 2024-03-03 DIAGNOSIS — R768 Other specified abnormal immunological findings in serum: Secondary | ICD-10-CM

## 2024-03-03 DIAGNOSIS — D500 Iron deficiency anemia secondary to blood loss (chronic): Secondary | ICD-10-CM

## 2024-03-03 NOTE — Telephone Encounter (Signed)
 Copied from CRM [id number]. Topic: Clinical - Lab/Test Results >> Mar 03, 2024  2:09 PM [place] MATSU wrote: Reason for CRM: patient called in requesting to go over lab results.   May you please assist.  Results given to pt with interpreter and lab follow up labs for Iron TIBC ferrttin b12 and folate  appt made. KH

## 2024-03-05 ENCOUNTER — Other Ambulatory Visit: Payer: Self-pay

## 2024-03-05 ENCOUNTER — Inpatient Hospital Stay: Admission: RE | Admit: 2024-03-05 | Source: Ambulatory Visit

## 2024-03-10 ENCOUNTER — Ambulatory Visit
Admission: RE | Admit: 2024-03-10 | Discharge: 2024-03-10 | Disposition: A | Discharge status: Home / Self Care | Source: Ambulatory Visit | Attending: Nurse Practitioner | Admitting: Nurse Practitioner

## 2024-03-10 ENCOUNTER — Other Ambulatory Visit: Payer: Self-pay

## 2024-03-10 DIAGNOSIS — D500 Iron deficiency anemia secondary to blood loss (chronic): Secondary | ICD-10-CM

## 2024-03-10 DIAGNOSIS — N920 Excessive and frequent menstruation with regular cycle: Secondary | ICD-10-CM

## 2024-03-11 ENCOUNTER — Other Ambulatory Visit: Payer: Self-pay | Admitting: Nurse Practitioner

## 2024-03-11 ENCOUNTER — Ambulatory Visit: Payer: Self-pay | Admitting: Nurse Practitioner

## 2024-03-11 DIAGNOSIS — D500 Iron deficiency anemia secondary to blood loss (chronic): Secondary | ICD-10-CM

## 2024-03-11 LAB — B12 AND FOLATE PANEL
Folate: 9.1 ng/mL (ref >3.0)
Vitamin B-12: 360 pg/mL (ref 232–1245)

## 2024-03-11 LAB — IRON,TIBC AND FERRITIN PANEL
Ferritin: 37 ng/mL (ref 15–150)
Iron Saturation: 8 % — CL (ref 15–55)
Iron: 32 ug/dL (ref 27–159)
Total Iron Binding Capacity: 398 ug/dL (ref 250–450)
UIBC: 366 ug/dL (ref 131–425)

## 2024-03-11 MED ORDER — FERROUS SULFATE 325 (65 FE) MG PO TABS: 325.0000 mg | ORAL_TABLET | Freq: Every day | ORAL | 1 refills | Status: DC

## 2024-03-20 ENCOUNTER — Telehealth: Payer: Self-pay

## 2024-03-20 NOTE — Telephone Encounter (Signed)
 Confirmed DPR lvm advising pt U/S was normal. [facility]

## 2024-03-25 ENCOUNTER — Telehealth: Payer: Self-pay | Admitting: Critical Care Medicine

## 2024-03-25 NOTE — Telephone Encounter (Signed)
 Called pt to reschedule appt. Pt did not answer and LVM for pt to return call at earliest.

## 2024-04-03 ENCOUNTER — Ambulatory Visit: Admitting: Family Medicine

## 2024-04-04 ENCOUNTER — Ambulatory Visit: Attending: Family Medicine | Admitting: Family Medicine

## 2024-04-04 ENCOUNTER — Other Ambulatory Visit (HOSPITAL_COMMUNITY)
Admission: RE | Admit: 2024-04-04 | Discharge: 2024-04-04 | Disposition: A | Discharge status: Home / Self Care | Source: Ambulatory Visit | Attending: Family Medicine | Admitting: Family Medicine

## 2024-04-04 ENCOUNTER — Other Ambulatory Visit: Payer: Self-pay

## 2024-04-04 ENCOUNTER — Encounter: Payer: Self-pay | Admitting: Family Medicine

## 2024-04-04 VITALS — BP 117/79 | HR 85 | Temp 98.3°F | Ht 65.0 in | Wt 212.0 lb

## 2024-04-04 DIAGNOSIS — N920 Excessive and frequent menstruation with regular cycle: Secondary | ICD-10-CM | POA: Diagnosis not present

## 2024-04-04 DIAGNOSIS — M898X9 Other specified disorders of bone, unspecified site: Secondary | ICD-10-CM

## 2024-04-04 DIAGNOSIS — E119 Type 2 diabetes mellitus without complications: Secondary | ICD-10-CM

## 2024-04-04 DIAGNOSIS — D509 Iron deficiency anemia, unspecified: Secondary | ICD-10-CM

## 2024-04-04 DIAGNOSIS — R350 Frequency of micturition: Secondary | ICD-10-CM

## 2024-04-04 DIAGNOSIS — Z1231 Encounter for screening mammogram for malignant neoplasm of breast: Secondary | ICD-10-CM

## 2024-04-04 DIAGNOSIS — Z7984 Long term (current) use of oral hypoglycemic drugs: Secondary | ICD-10-CM

## 2024-04-04 DIAGNOSIS — R399 Unspecified symptoms and signs involving the genitourinary system: Secondary | ICD-10-CM | POA: Diagnosis present

## 2024-04-04 DIAGNOSIS — R319 Hematuria, unspecified: Secondary | ICD-10-CM | POA: Diagnosis not present

## 2024-04-04 DIAGNOSIS — M899 Disorder of bone, unspecified: Secondary | ICD-10-CM

## 2024-04-04 DIAGNOSIS — D500 Iron deficiency anemia secondary to blood loss (chronic): Secondary | ICD-10-CM

## 2024-04-04 DIAGNOSIS — E1169 Type 2 diabetes mellitus with other specified complication: Secondary | ICD-10-CM

## 2024-04-04 DIAGNOSIS — E785 Hyperlipidemia, unspecified: Secondary | ICD-10-CM

## 2024-04-04 LAB — POCT URINALYSIS DIP (CLINITEK)
Bilirubin, UA: NEGATIVE
Glucose, UA: NEGATIVE mg/dL
Ketones, POC UA: NEGATIVE mg/dL
Nitrite, UA: NEGATIVE
POC PROTEIN,UA: 30 — AB
Spec Grav, UA: 1.02 (ref 1.010–1.025)
Urobilinogen, UA: 0.2 U/dL
pH, UA: 7 (ref 5.0–8.0)

## 2024-04-04 LAB — POCT GLYCOSYLATED HEMOGLOBIN (HGB A1C): HbA1c, POC (controlled diabetic range): 6.2 % (ref 0.0–7.0)

## 2024-04-04 MED ORDER — FERROUS SULFATE 325 (65 FE) MG PO TABS
325.0000 mg | ORAL_TABLET | Freq: Every day | ORAL | 1 refills | Status: AC
  Filled 2024-04-04: qty 100, 100d supply, fill #0

## 2024-04-04 MED ORDER — IBUPROFEN 800 MG PO TABS
800.0000 mg | ORAL_TABLET | Freq: Two times a day (BID) | ORAL | 1 refills | Status: AC | PRN
  Filled 2024-04-04: qty 60, 30d supply, fill #0
  Filled 2024-07-10: qty 60, 30d supply, fill #1

## 2024-04-04 NOTE — Patient Instructions (Signed)
 VISIT SUMMARY:  Victoria Schneider, during your visit today, we discussed your frequent urination, bone pain, and ongoing management of your iron deficiency anemia, type 2 diabetes, and hyperlipidemia. We also reviewed your general health maintenance needs.  YOUR PLAN:  -IRON DEFICIENCY ANEMIA: Iron deficiency anemia means you have a lower than normal number of red blood cells due to a lack of iron. We refilled your iron tablets and ordered a blood test to check your hemoglobin levels. We also ordered a pelvic ultrasound to check for fibroids, which might be causing your heavy periods and contributing to your anemia. If your anemia continues, we may refer you to a specialist.  -HEMATURIA AND FREQUENT URINATION: Hematuria means there is blood in your urine. We discussed your frequent urination and mild discomfort. We encouraged you to drink more fluids and cranberry juice. We also performed a vaginal swab to rule out any infection.  -TYPE 2 DIABETES MELLITUS WITH HYPERGLYCEMIA: Type 2 diabetes is a condition where your body does not use insulin properly, leading to high blood sugar levels. Your blood sugar control has improved. Continue taking Metformin  1000 mg twice daily and Dapagliflozin  5 mg daily. We also ordered a urine test to check your kidneys.  -HYPERLIPIDEMIA: Hyperlipidemia means you have high levels of fats in your blood. Continue taking atorvastatin  20 mg daily to manage this condition.  -GENERAL HEALTH MAINTENANCE: We discussed the importance of regular health screenings. We ordered a mammogram and recommended scheduling an eye exam if you haven't had one in the past year.  INSTRUCTIONS:  Please follow up with the blood test and pelvic ultrasound as ordered. Continue taking your medications as prescribed. Schedule your mammogram and eye exam. If your symptoms persist or worsen, please contact our office.

## 2024-04-04 NOTE — Progress Notes (Signed)
 Subjective:  Patient ID: Victoria Schneider, female    DOB: April 25, 1981  Age: 43 y.o. MRN: [id number]  CC: Medical Management of Chronic Issues (Frequent urination)     Discussed the use of AI scribe software for clinical note transcription with the patient, who gave verbal consent to proceed.  History of Present Illness Victoria Schneider is a 43 year old female with type 2 diabetes mellitus, iron deficiency anemia who presents with frequent urination and bone pain.  She experiences frequent urination for the past two weeks, with incomplete bladder emptying and a need to urinate again shortly after. There is mild dysuria and discomfort in the lower abdomen and back. She denies fever or significant vaginal discharge, though there is some discharge without itching. No over-the-counter medications have been used for these symptoms.  She has iron deficiency anemia with recent low iron levels and a hemoglobin of 8.1. She takes iron tablets. Bone pain is present throughout her body, especially in cold weather. She uses ibuprofen  and Tylenol  for pain but is concerned about ibuprofen 's effect on her kidneys.  Her menstrual cycles are regular every 28 days, with heavy periods and slight bleeding post-menstruation. She experiences bleeding during urination and rectal pain, particularly during bathroom use, and has hemorrhoids. Pelvic ultrasound from 02/2024 was negative for uterine fibroids.  A1c today is 6.2 and she is adherent with her medications.  Also doing well on her statin.  Past Medical History:  Diagnosis Date   Acute cystitis with hematuria 03/10/2021   Acute cystitis with hematuria 03/10/2021   Elevated LFTs 06/09/2020   GERD (gastroesophageal reflux disease)    Type 2 diabetes mellitus with hyperglycemia, without long-term current use of insulin (HCC) 05/05/2020    Past Surgical History:  Procedure Laterality Date   NO PAST SURGERIES      Family History  Problem Relation  Age of Onset   Diabetes Father    Hypertension Father    Diabetes Brother    Hypertension Mother    Depression Daughter     Social History   Socioeconomic History   Marital status: Single    Spouse name: Not on file   Number of children: Not on file   Years of education: Not on file   Highest education level: Not on file  Occupational History   Not on file  Tobacco Use   Smoking status: Never   Smokeless tobacco: Never  Vaping Use   Vaping status: Never Used  Substance and Sexual Activity   Alcohol use: Not Currently    Comment: rarely   Drug use: No   Sexual activity: Yes    Birth control/protection: None  Other Topics Concern   Not on file  Social History Narrative   Not on file   Social Drivers of Health   Financial Resource Strain: Low Risk  (10/04/2023)   Overall Financial Resource Strain (CARDIA)    Difficulty of Paying Living Expenses: Not hard at all  Food Insecurity: No Food Insecurity (02/29/2024)   Hunger Vital Sign    Worried About Running Out of Food in the Last Year: Never true    Ran Out of Food in the Last Year: Never true  Transportation Needs: No Transportation Needs (02/29/2024)   PRAPARE - Administrator, Civil Service (Medical): No    Lack of Transportation (Non-Medical): No  Physical Activity: Inactive (10/04/2023)   Exercise Vital Sign    Days of Exercise per Week: 0 days  Minutes of Exercise per Session: 0 min  Stress: No Stress Concern Present (10/04/2023)   [employer] of Occupational Health - Occupational Stress Questionnaire    Feeling of Stress : Not at all  Social Connections: Moderately Isolated (10/04/2023)   Social Connection and Isolation Panel    Frequency of Communication with Friends and Family: Twice a week    Frequency of Social Gatherings with Friends and Family: Twice a week    Attends Religious Services: Never    Database administrator or Organizations: No    Attends Engineer, structural: Never     Marital Status: Living with partner    Allergies  Allergen Reactions   Trulicity  [Dulaglutide ] Nausea And Vomiting   Penicillins Rash and Other (See Comments)    Has patient had a PCN reaction causing immediate rash, facial/tongue/throat swelling, SOB or lightheadedness with hypotension: No Has patient had a PCN reaction causing severe rash involving mucus membranes or skin necrosis: No Has patient had a PCN reaction that required hospitalization No Has patient had a PCN reaction occurring within the last 10 years: Yes If all of the above answers are NO, then may proceed with Cephalosporin use.     Outpatient Medications Prior to Visit  Medication Sig Dispense Refill   acetaminophen  (TYLENOL ) 325 MG tablet Take 650 mg by mouth every 6 (six) hours as needed.     atorvastatin  (LIPITOR) 20 MG tablet Take 1 tablet (20 mg total) by mouth daily. 90 tablet 3   Blood Glucose Monitoring Suppl (TRUE METRIX METER) w/Device KIT 1 each by Does not apply route in the morning and at bedtime. 1 kit 0   FARXIGA  5 MG TABS tablet Take 1 tablet (5 mg total) by mouth daily. 90 tablet 1   fluticasone  (FLONASE ) 50 MCG/ACT nasal spray PLACE 2 SPRAYS INTO BOTH NOSTRILS DAILY. 16 g 3   glucose blood (TRUE METRIX BLOOD GLUCOSE TEST) test strip 100 each by Other route See admin instructions. Use to check blood sugar 100 each 0   levocetirizine (XYZAL ) 5 MG tablet Take 1 tablet (5 mg total) by mouth every evening. 90 tablet 1   metFORMIN  (GLUCOPHAGE ) 1000 MG tablet Take 1 tablet (1,000 mg total) by mouth 2 (two) times daily with a meal. 180 tablet 1   Meth-Hyo-M Bl-Na Phos-Ph Sal (URO-MP) 118 MG CAPS TAKE 1 CAPSULE BY MOUTH EVERY 8 HOURS AS NEEDED 30 capsule 0   Vitamin D , Ergocalciferol , (DRISDOL ) 1.25 MG (50000 UNIT) CAPS capsule Take 1 capsule (50,000 Units total) by mouth every 7 (seven) days. 4 capsule 3   ferrous sulfate  325 (65 FE) MG tablet Take 1 tablet (325 mg total) by mouth daily. 100 tablet 1    ibuprofen  (ADVIL ) 600 MG tablet Take 1 tablet (600 mg total) by mouth every 6 (six) hours as needed. 90 tablet 1   No facility-administered medications prior to visit.     ROS Review of Systems  Constitutional:  Negative for activity change and appetite change.  HENT:  Negative for sinus pressure and sore throat.   Respiratory:  Negative for chest tightness, shortness of breath and wheezing.   Cardiovascular:  Negative for chest pain and palpitations.  Gastrointestinal:  Negative for abdominal distention, abdominal pain and constipation.  Genitourinary:  Positive for frequency.  Musculoskeletal: Negative.   Psychiatric/Behavioral:  Negative for behavioral problems and dysphoric mood.     Objective:  BP 117/79   Pulse 85   Temp 98.3 F (36.8 C) (Oral)  Ht 5' 5 (1.651 m)   Wt 212 lb (96.2 kg)   SpO2 99%   BMI 35.28 kg/m      04/04/2024    3:02 PM 02/29/2024    9:21 AM 10/04/2023   10:18 AM  BP/Weight  Systolic BP 117 114 116  Diastolic BP 79 56 76  Wt. (Lbs) 212 212 216  BMI 35.28 kg/m2 35.28 kg/m2 35.94 kg/m2      Physical Exam Constitutional:      Appearance: She is well-developed.  Cardiovascular:     Rate and Rhythm: Normal rate.     Heart sounds: Normal heart sounds. No murmur heard. Pulmonary:     Effort: Pulmonary effort is normal.     Breath sounds: Normal breath sounds. No wheezing or rales.  Chest:     Chest wall: No tenderness.  Abdominal:     General: Bowel sounds are normal. There is no distension.     Palpations: Abdomen is soft. There is no mass.     Tenderness: There is no abdominal tenderness.  Musculoskeletal:        General: Normal range of motion.     Right lower leg: No edema.     Left lower leg: No edema.  Neurological:     Mental Status: She is alert and oriented to person, place, and time.  Psychiatric:        Mood and Affect: Mood normal.        Latest Ref Rng & Units 02/29/2024   11:14 AM 04/12/2023    9:59 AM 01/09/2023    11:39 AM  CMP  Glucose 70 - 99 mg/dL 862  888  897   BUN 6 - 24 mg/dL 4  8  6    Creatinine 0.57 - 1.00 mg/dL 9.43  9.31  9.36   Sodium 134 - 144 mmol/L 141  140  141   Potassium 3.5 - 5.2 mmol/L 4.2  4.7  5.0   Chloride 96 - 106 mmol/L 106  103  103   CO2 20 - 29 mmol/L 23  24  25    Calcium  8.7 - 10.2 mg/dL 8.9  9.2  9.7   Total Protein 6.0 - 8.5 g/dL  6.9    Total Bilirubin 0.0 - 1.2 mg/dL  0.7    Alkaline Phos 44 - 121 IU/L  129    AST 0 - 40 IU/L  39    ALT 0 - 32 IU/L  39      Lipid Panel     Component Value Date/Time   CHOL 153 04/12/2023 0959   TRIG 78 04/12/2023 0959   HDL 77 04/12/2023 0959   CHOLHDL 3.2 09/05/2022 1158   LDLCALC 61 04/12/2023 0959    CBC    Component Value Date/Time   WBC 7.4 02/29/2024 1114   WBC 9.7 07/22/2017 0243   RBC 4.42 02/29/2024 1114   RBC 4.79 07/22/2017 0243   HGB 8.1 (L) 02/29/2024 1114   HCT 29.9 (L) 02/29/2024 1114   PLT 462 (H) 02/29/2024 1114   MCV 68 (L) 02/29/2024 1114   MCH 18.3 (L) 02/29/2024 1114   MCH 25.9 (L) 07/22/2017 0243   MCHC 27.1 (L) 02/29/2024 1114   MCHC 31.5 07/22/2017 0243   RDW 17.9 (H) 02/29/2024 1114   LYMPHSABS 3.4 (H) 04/12/2023 0959   MONOABS 0.9 03/17/2012 2154   EOSABS 0.1 04/12/2023 0959   BASOSABS 0.1 04/12/2023 0959    Lab Results  Component Value Date   HGBA1C  6.2 04/04/2024    Lab Results  Component Value Date   HGBA1C 6.2 04/04/2024   HGBA1C 6.7 10/04/2023   HGBA1C 6.9 04/12/2023        Assessment & Plan Iron deficiency anemia Presence of menorrhagia with irregular cycle Hemoglobin at 8.1. Bone pain reported.  Differential includes fibroids or other bleeding causes. Heavy menstrual periods and possible fibroids may contribute. - Refilled ferrous sulfate . - Ordered complete blood count to reassess hemoglobin. - Pelvic ultrasound negative for anemia - Consider hematology referral if anemia persists.  Hematuria and frequent urination Frequent urination and hematuria with  trace leukocytes, insufficient for UTI diagnosis. -CT renal stone protocol negative for nephrolithiasis - Encouraged increased fluid intake and cranberry juice. - Performed vaginal swab to rule out infection.  Type 2 diabetes mellitus with other specified complications A1c at 6.2, improved from 6.7 and 6.9. Blood glucose control excellent. - Continue Metformin  1000 mg twice daily and Dapagliflozin  5 mg daily. - Ordered microalbumin urine test for diabetic nephropathy.  Hyperlipidemia Managed with atorvastatin  20 mg daily. No new issues. - Continue atorvastatin  20 mg daily.  Bone pain - Could be underlying arthritis - Will check CBC  General Health Maintenance Mammogram not recently done. Last eye exam possibly last year. - Ordered mammogram. - Schedule eye exam if not done within the last year.    Healthcare maintenance Encounter for screening for breast cancer-mammogram ordered  Meds ordered this encounter  Medications   ferrous sulfate  325 (65 FE) MG tablet    Sig: Take 1 tablet (325 mg total) by mouth daily.    Dispense:  100 tablet    Refill:  1   ibuprofen  (ADVIL ) 800 MG tablet    Sig: Take 1 tablet (800 mg total) by mouth 2 (two) times daily as needed.    Dispense:  60 tablet    Refill:  1    Follow-up: Return in about 6 months (around 10/03/2024).       Corrina Sabin, MD, FAAFP. [facility] [place], [place] [phone]   04/04/2024, 5:35 PM

## 2024-04-06 LAB — CBC WITH DIFFERENTIAL/PLATELET

## 2024-04-07 ENCOUNTER — Ambulatory Visit: Payer: Self-pay | Admitting: Family Medicine

## 2024-04-07 LAB — CBC WITH DIFFERENTIAL/PLATELET
Basos: 1 %
EOS (ABSOLUTE): 0.1 x10E3/uL (ref 0.0–0.2)
Eos: 2 %
Hematocrit: 38 % (ref 34.0–46.6)
Hemoglobin: 11.1 g/dL (ref 11.1–15.9)
Immature Granulocytes: 0 %
Immature Granulocytes: 0 x10E3/uL (ref 0.0–0.1)
Lymphs: 38 %
MCH: 21.7 pg — AB (ref 26.6–33.0)
MCHC: 29.2 g/dL — AB (ref 31.5–35.7)
MCV: 74 fL — AB (ref 79–97)
Monocytes Absolute: 0.2 x10E3/uL (ref 0.0–0.4)
Monocytes Absolute: 0.9 x10E3/uL (ref 0.1–0.9)
Monocytes: 9 %
Neutrophils Absolute: 3.8 x10E3/uL — AB (ref 0.7–3.1)
Neutrophils Absolute: 5.1 x10E3/uL (ref 1.4–7.0)
Neutrophils: 50 %
Platelets: 352 x10E3/uL (ref 150–450)
RBC: 5.12 x10E6/uL (ref 3.77–5.28)
RDW: 25.4 % — AB (ref 11.7–15.4)
WBC: 10.1 x10E3/uL (ref 3.4–10.8)

## 2024-04-07 LAB — CERVICOVAGINAL ANCILLARY ONLY
Bacterial Vaginitis (gardnerella): POSITIVE — AB
Candida Glabrata: POSITIVE — AB
Candida Vaginitis: NEGATIVE
Chlamydia: NEGATIVE
Comment: NEGATIVE
Comment: NEGATIVE
Comment: NEGATIVE
Comment: NEGATIVE
Comment: NEGATIVE
Comment: NORMAL
Neisseria Gonorrhea: NEGATIVE
Trichomonas: NEGATIVE

## 2024-04-07 LAB — MICROALBUMIN / CREATININE URINE RATIO
Creatinine, Urine: 117 mg/dL
Microalb/Creat Ratio: 62 mg/g{creat} — ABNORMAL HIGH (ref 0–29)
Microalbumin, Urine: 72.7 ug/mL

## 2024-04-08 ENCOUNTER — Other Ambulatory Visit: Payer: Self-pay

## 2024-04-08 MED ORDER — FLUCONAZOLE 150 MG PO TABS
150.0000 mg | ORAL_TABLET | Freq: Once | ORAL | 0 refills | Status: AC
  Filled 2024-04-08: qty 1, 1d supply, fill #0

## 2024-04-08 MED ORDER — METRONIDAZOLE 500 MG PO TABS
500.0000 mg | ORAL_TABLET | Freq: Two times a day (BID) | ORAL | 0 refills | Status: AC
  Filled 2024-04-08: qty 14, 7d supply, fill #0

## 2024-04-08 NOTE — Telephone Encounter (Signed)
 Copied from CRM #[id number]. Topic: Clinical - Lab/Test Results >> Apr 08, 2024  2:09 PM Avram MATSU wrote:  Reason for CRM: I relayed the test results to the patient.  >> Apr 08, 2024  2:10 PM Avram MATSU wrote:  Patient would like a call from the office for future labs

## 2024-04-09 ENCOUNTER — Other Ambulatory Visit: Payer: Self-pay

## 2024-04-14 ENCOUNTER — Ambulatory Visit

## 2024-05-02 ENCOUNTER — Ambulatory Visit

## 2024-05-09 ENCOUNTER — Other Ambulatory Visit: Payer: Self-pay | Admitting: Critical Care Medicine

## 2024-05-09 ENCOUNTER — Other Ambulatory Visit: Payer: Self-pay

## 2024-05-12 ENCOUNTER — Other Ambulatory Visit: Payer: Self-pay

## 2024-05-12 MED ORDER — VITAMIN D (ERGOCALCIFEROL) 1.25 MG (50000 UNIT) PO CAPS
50000.0000 [IU] | ORAL_CAPSULE | ORAL | 3 refills | Status: AC
  Filled 2024-05-12 – 2024-05-29 (×2): qty 4, 28d supply, fill #0
  Filled 2024-06-26: qty 4, 28d supply, fill #1

## 2024-05-14 ENCOUNTER — Other Ambulatory Visit: Payer: Self-pay

## 2024-05-14 DIAGNOSIS — R928 Other abnormal and inconclusive findings on diagnostic imaging of breast: Secondary | ICD-10-CM

## 2024-05-21 ENCOUNTER — Other Ambulatory Visit: Payer: Self-pay

## 2024-05-29 ENCOUNTER — Other Ambulatory Visit: Payer: Self-pay | Admitting: Critical Care Medicine

## 2024-05-29 ENCOUNTER — Other Ambulatory Visit: Payer: Self-pay

## 2024-05-29 DIAGNOSIS — E1165 Type 2 diabetes mellitus with hyperglycemia: Secondary | ICD-10-CM

## 2024-05-29 MED ORDER — TRUE METRIX BLOOD GLUCOSE TEST VI STRP
100.0000 | ORAL_STRIP | 3 refills | Status: AC
  Filled 2024-05-29 (×2): qty 100, 30d supply, fill #0
  Filled 2024-06-26: qty 100, 30d supply, fill #1

## 2024-05-30 ENCOUNTER — Other Ambulatory Visit: Payer: Self-pay

## 2024-06-25 ENCOUNTER — Telehealth: Payer: Self-pay | Admitting: Family Medicine

## 2024-06-25 NOTE — Telephone Encounter (Signed)
 Pt unconfirmed appt (per vr) lvm

## 2024-06-26 ENCOUNTER — Ambulatory Visit
Admission: RE | Admit: 2024-06-26 | Discharge: 2024-06-26 | Disposition: A | Discharge status: Home / Self Care | Payer: Self-pay | Source: Ambulatory Visit | Attending: Family Medicine

## 2024-06-26 ENCOUNTER — Other Ambulatory Visit: Payer: Self-pay

## 2024-06-26 VITALS — BP 130/86 | HR 81 | Resp 16

## 2024-06-26 DIAGNOSIS — J069 Acute upper respiratory infection, unspecified: Secondary | ICD-10-CM

## 2024-06-26 DIAGNOSIS — B9689 Other specified bacterial agents as the cause of diseases classified elsewhere: Secondary | ICD-10-CM | POA: Diagnosis not present

## 2024-06-26 DIAGNOSIS — H9203 Otalgia, bilateral: Secondary | ICD-10-CM

## 2024-06-26 MED ORDER — PROMETHAZINE-DM 6.25-15 MG/5ML PO SYRP
5.0000 mL | ORAL_SOLUTION | Freq: Three times a day (TID) | ORAL | 0 refills | Status: AC | PRN
  Filled 2024-06-26: qty 200, 14d supply, fill #0

## 2024-06-26 MED ORDER — CETIRIZINE HCL 10 MG PO TABS
10.0000 mg | ORAL_TABLET | Freq: Every day | ORAL | 0 refills | Status: AC
  Filled 2024-06-26: qty 30, 30d supply, fill #0

## 2024-06-26 MED ORDER — CEFDINIR 300 MG PO CAPS
300.0000 mg | ORAL_CAPSULE | Freq: Two times a day (BID) | ORAL | 0 refills | Status: AC
  Filled 2024-06-26: qty 20, 10d supply, fill #0

## 2024-06-26 MED ORDER — PSEUDOEPHEDRINE HCL 30 MG PO TABS
30.0000 mg | ORAL_TABLET | Freq: Three times a day (TID) | ORAL | 0 refills | Status: AC | PRN
  Filled 2024-06-26: qty 30, 10d supply, fill #0

## 2024-06-26 NOTE — ED Provider Notes (Signed)
 " Producer, Television/film/video - [facility]  Note:  This document was prepared using Conservation officer, historic buildings and may include unintentional dictation errors.  MRN: [id number] DOB: Nov 06, 1980  Subjective:   Victoria Schneider is a 44 y.o. female presenting for 6-week history of ongoing persistent malaise, fatigue, bilateral ear pain, fullness, coughing, throat pain and drainage.  Has had fevers as well, last episode was 2 days ago and was 102 F.  Patient has used multiple over-the-counter medications with minimal relief.  She did have some GI symptoms but have not resolved.  No chest pain, shortness of breath, wheezing.  No asthma.  No smoking of any kind including cigarettes, cigars, vaping, marijuana use.  Has type 2 diabetes, is treated without insulin.  Blood sugars are well-controlled.  Current Outpatient Medications  Medication Instructions   acetaminophen  (TYLENOL ) 650 mg, Every 6 hours PRN   atorvastatin  (LIPITOR) 20 mg, Oral, Daily   Blood Glucose Monitoring Suppl (TRUE METRIX METER) w/Device KIT 1 each, Does not apply, 2 times daily   Farxiga  5 mg, Oral, Daily   FeroSul 325 mg, Oral, Daily   fluticasone  (FLONASE ) 50 MCG/ACT nasal spray PLACE 2 SPRAYS INTO BOTH NOSTRILS DAILY.   glucose blood (TRUE METRIX BLOOD GLUCOSE TEST) test strip Use to check blood sugar daily as directed   ibuprofen  (ADVIL ) 800 mg, Oral, 2 times daily PRN   levocetirizine (XYZAL ) 5 mg, Oral, Every evening   metFORMIN  (GLUCOPHAGE ) 1,000 mg, Oral, 2 times daily with meals   Meth-Hyo-M Bl-Na Phos-Ph Sal (URO-MP) 118 MG CAPS TAKE 1 CAPSULE BY MOUTH EVERY 8 HOURS AS NEEDED   Vitamin D  (Ergocalciferol ) (DRISDOL ) 50,000 Units, Oral, Every 7 days    Allergies[1]  Past Medical History:  Diagnosis Date   Acute cystitis with hematuria 03/10/2021   Acute cystitis with hematuria 03/10/2021   Elevated LFTs 06/09/2020   GERD (gastroesophageal reflux disease)    Type 2 diabetes mellitus with hyperglycemia,  without long-term current use of insulin (HCC) 05/05/2020     Past Surgical History:  Procedure Laterality Date   KNEE SURGERY Right 10/2023   NO PAST SURGERIES      Family History  Problem Relation Age of Onset   Diabetes Father    Hypertension Father    Diabetes Brother    Hypertension Mother    Depression Daughter     Social History   Occupational History   Not on file  Tobacco Use   Smoking status: Never   Smokeless tobacco: Never  Vaping Use   Vaping status: Never Used  Substance and Sexual Activity   Alcohol use: Yes    Comment: rarely   Drug use: No   Sexual activity: Yes    Birth control/protection: None     ROS   Objective:   Vitals: BP 130/86 (BP Location: Left Arm)   Pulse 81   Resp 16   LMP 06/19/2024   SpO2 97%   Physical Exam Constitutional:      General: She is not in acute distress.    Appearance: Normal appearance. She is well-developed and normal weight. She is not ill-appearing, toxic-appearing or diaphoretic.  HENT:     Head: Normocephalic and atraumatic.     Right Ear: Tympanic membrane, ear canal and external ear normal. No drainage or tenderness. No middle ear effusion. There is no impacted cerumen. Tympanic membrane is not erythematous or bulging.     Left Ear: Tympanic membrane, ear canal and external ear normal. No drainage  or tenderness.  No middle ear effusion. There is no impacted cerumen. Tympanic membrane is not erythematous or bulging.     Nose: Nose normal. No congestion or rhinorrhea.     Mouth/Throat:     Mouth: Mucous membranes are moist. No oral lesions.     Pharynx: No pharyngeal swelling, oropharyngeal exudate, posterior oropharyngeal erythema or uvula swelling.     Tonsils: No tonsillar exudate or tonsillar abscesses. 0 on the right. 0 on the left.  Eyes:     General: No scleral icterus.       Right eye: No discharge.        Left eye: No discharge.     Extraocular Movements: Extraocular movements intact.      Right eye: Normal extraocular motion.     Left eye: Normal extraocular motion.     Conjunctiva/sclera: Conjunctivae normal.  Cardiovascular:     Rate and Rhythm: Normal rate and regular rhythm.     Heart sounds: Normal heart sounds. No murmur heard.    No friction rub. No gallop.  Pulmonary:     Effort: Pulmonary effort is normal. No respiratory distress.     Breath sounds: No stridor. No wheezing, rhonchi or rales.  Chest:     Chest wall: No tenderness.  Musculoskeletal:     Cervical back: Normal range of motion and neck supple.  Lymphadenopathy:     Cervical: No cervical adenopathy.  Skin:    General: Skin is warm and dry.  Neurological:     General: No focal deficit present.     Mental Status: She is alert and oriented to person, place, and time.  Psychiatric:        Mood and Affect: Mood normal.        Behavior: Behavior normal.     Assessment and Plan :   PDMP not reviewed this encounter.  1. Bacterial upper respiratory infection   2. Acute ear pain, bilateral      Will manage for bacterial upper respiratory infection with cefdinir , supportive care.  Deferred imaging given clear pulmonary exam.  Counseled patient on potential for adverse effects with medications prescribed/recommended today, ER and return-to-clinic precautions discussed, patient verbalized understanding.     [1]  Allergies Allergen Reactions   Trulicity  [Dulaglutide ] Nausea And Vomiting   Penicillins Rash and Other (See Comments)    Has patient had a PCN reaction causing immediate rash, facial/tongue/throat swelling, SOB or lightheadedness with hypotension: No Has patient had a PCN reaction causing severe rash involving mucus membranes or skin necrosis: No Has patient had a PCN reaction that required hospitalization No Has patient had a PCN reaction occurring within the last 10 years: Yes If all of the above answers are NO, then may proceed with Cephalosporin use.      Christopher Savannah,  PA-C 06/26/24 1500  "

## 2024-06-26 NOTE — ED Triage Notes (Signed)
 Pt presents for evaluation of bilateral ear pain, cough, sore throat, fever x 6 weeks. Highest temp 2 days ago was 102. Patient has been taking Tylenol  Cold and Flu and Ibuprofen . Pt reports diarrhea 1 week ago, liquid diarrhea several times that day. Stools are now formed. Denies any nausea or vomiting. Her husband was sick too, but his symptoms have improved. Pt feels like her symptoms are worse.

## 2024-06-26 NOTE — Discharge Instructions (Addendum)
 We will manage this bacterial upper respiratory infection with amoxicillin -clavulanate. For sore throat or cough try using a honey-based tea. Use 3 teaspoons of honey with juice squeezed from half lemon. Place shaved pieces of ginger into 1/2-1 cup of water and warm over stove top. Then mix the ingredients and repeat every 4 hours as needed. Please take ibuprofen  600mg  every 6 hours with food alternating with OR taken together with Tylenol  500mg -650mg  every 6 hours for throat pain, fevers, aches and pains. Hydrate very well with at least 2 liters of water. Eat light meals such as soups (chicken and noodles, vegetable, chicken and wild rice).  Do not eat foods that you are allergic to.  Taking an antihistamine like Zyrtec  (10mg  daily) can help against postnasal drainage, sinus congestion which can cause sinus pain, sinus headaches, throat pain, painful swallowing, coughing.  You can take this together with pseudoephedrine  (Sudafed) at a dose of 30 mg 3 times a day or twice daily as needed for the same kind of nasal drip, congestion.  Use cough syrup as needed.

## 2024-06-27 ENCOUNTER — Ambulatory Visit: Payer: Self-pay | Admitting: Nurse Practitioner

## 2024-07-07 ENCOUNTER — Ambulatory Visit: Payer: Self-pay

## 2024-07-07 NOTE — Telephone Encounter (Signed)
Noted patient has been scheduled .

## 2024-07-07 NOTE — Telephone Encounter (Signed)
" °  FYI Only or Action Required?: FYI only for provider: appointment scheduled on 1/22.  Patient was last seen in primary care on 04/04/2024 by Newlin, Enobong, MD.  Called Nurse Triage reporting Headache.  Symptoms began 2 weeks ago.  Interventions attempted: OTC medications: ibuprofen .  Symptoms are: stable.  Triage Disposition: See Physician Within 24 Hours  Patient/caregiver understands and will follow disposition?: Yes, but will wait  Reason for Triage: patient has severe head pain and hair falling out  Reason for Disposition  [1] MODERATE headache (e.g., interferes with normal activities) AND [2] present > 24 hours AND [3] unexplained  (Exceptions: Pain medicines not tried, typical migraine, or headache part of viral illness.)  Answer Assessment - Initial Assessment Questions 1. LOCATION: Where does it hurt?      All over 2. ONSET: When did the headache start? (e.g., minutes, hours, days)      2 wks ago 3. PATTERN: Does the pain come and go, or has it been constant since it started?     constant 4. SEVERITY: How bad is the pain? and What does it keep you from doing?  (e.g., Scale 1-10; mild, moderate, or severe)     6/10 5. RECURRENT SYMPTOM: Have you ever had headaches before? If Yes, ask: When was the last time? and What happened that time?      no 6. CAUSE: What do you think is causing the headache?     unknown 7. MIGRAINE: Have you been diagnosed with migraine headaches? If Yes, ask: Is this headache similar?      no 8. HEAD INJURY: Has there been any recent injury to your head?      no 9. OTHER SYMPTOMS: Do you have any other symptoms? (e.g., fever, stiff neck, eye pain, sore throat, cold symptoms)  Protocols used: Headache-A-AH  "

## 2024-07-09 ENCOUNTER — Telehealth: Payer: Self-pay | Admitting: Family Medicine

## 2024-07-09 NOTE — Telephone Encounter (Signed)
 07/09/24 Left Vm  regarding address and appointment time

## 2024-07-10 ENCOUNTER — Other Ambulatory Visit: Payer: Self-pay

## 2024-07-10 ENCOUNTER — Encounter: Payer: Self-pay | Admitting: Family Medicine

## 2024-07-10 ENCOUNTER — Ambulatory Visit: Payer: Self-pay | Admitting: Family Medicine

## 2024-07-10 VITALS — BP 118/80 | HR 90 | Temp 97.5°F | Resp 16 | Ht 65.0 in | Wt 213.0 lb

## 2024-07-10 DIAGNOSIS — L219 Seborrheic dermatitis, unspecified: Secondary | ICD-10-CM

## 2024-07-10 DIAGNOSIS — J328 Other chronic sinusitis: Secondary | ICD-10-CM

## 2024-07-10 DIAGNOSIS — R519 Headache, unspecified: Secondary | ICD-10-CM

## 2024-07-10 DIAGNOSIS — L659 Nonscarring hair loss, unspecified: Secondary | ICD-10-CM

## 2024-07-10 DIAGNOSIS — Z1211 Encounter for screening for malignant neoplasm of colon: Secondary | ICD-10-CM

## 2024-07-10 MED ORDER — KETOCONAZOLE 2 % EX SHAM
1.0000 | MEDICATED_SHAMPOO | CUTANEOUS | 0 refills | Status: AC
  Filled 2024-07-10: qty 120, 30d supply, fill #0

## 2024-07-10 MED ORDER — PREDNISONE 20 MG PO TABS
20.0000 mg | ORAL_TABLET | Freq: Every day | ORAL | 0 refills | Status: AC
  Filled 2024-07-10: qty 5, 5d supply, fill #0

## 2024-07-10 NOTE — Progress Notes (Unsigned)
 Noticed hair loss since April. Thought it was related to anesthesia however has noticed that a lot continues to come out.  Noticed scalp sensitivity and occasional head ache.

## 2024-07-10 NOTE — Patient Instructions (Signed)

## 2024-07-10 NOTE — Progress Notes (Unsigned)
 "  Subjective:  Patient ID: Victoria Schneider, female    DOB: March 09, 1981  Age: 44 y.o. MRN: [id number]  CC: Alopecia     Discussed the use of AI scribe software for clinical note transcription with the patient, who gave verbal consent to proceed.  History of Present Illness Victoria Schneider is a 44 year old female who presents with hair loss and scalp sensitivity.  She has had 1 month of increased hair loss with scalp pain and sensitivity, especially when showering, running her hands through her hair, or combing. She denies itching or burning. She had a similar episode about a year ago that resolved, and she is unsure if there are discrete patches of hair loss now.  She has head pain and is unsure if it is from the scalp or a headache. The scalp and head feel sore and sensitive with hair manipulation. She has no jaw pain or difficulty opening or closing her mouth.  Two weeks ago she was diagnosed with a sinus infection at urgent care and finished cefdinir  yesterday. She still has sinus pressure and morning nasal discharge with a sensation of mucus coming out but no facial pain. She has concerns about her tonsils. She is taking Zyrtec  and using Flonase .  She requests a refill of ibuprofen  800 mg, which she uses as needed.    Past Medical History:  Diagnosis Date   Acute cystitis with hematuria 03/10/2021   Acute cystitis with hematuria 03/10/2021   Elevated LFTs 06/09/2020   GERD (gastroesophageal reflux disease)    Type 2 diabetes mellitus with hyperglycemia, without long-term current use of insulin (HCC) 05/05/2020    Past Surgical History:  Procedure Laterality Date   KNEE SURGERY Right 10/2023   NO PAST SURGERIES      Family History  Problem Relation Age of Onset   Diabetes Father    Hypertension Father    Diabetes Brother    Hypertension Mother    Depression Daughter     Social History   Socioeconomic History   Marital status: Single    Spouse name: Not  on file   Number of children: Not on file   Years of education: Not on file   Highest education level: Not on file  Occupational History   Not on file  Tobacco Use   Smoking status: Never   Smokeless tobacco: Never  Vaping Use   Vaping status: Never Used  Substance and Sexual Activity   Alcohol use: Yes    Comment: rarely   Drug use: No   Sexual activity: Yes    Birth control/protection: None  Other Topics Concern   Not on file  Social History Narrative   Not on file   Social Drivers of Health   Tobacco Use: Low Risk (07/10/2024)   Patient History    Smoking Tobacco Use: Never    Smokeless Tobacco Use: Never    Passive Exposure: Not on file  Financial Resource Strain: Low Risk (10/04/2023)   Overall Financial Resource Strain (CARDIA)    Difficulty of Paying Living Expenses: Not hard at all  Food Insecurity: No Food Insecurity (02/29/2024)   Epic    Worried About Programme Researcher, Broadcasting/film/video in the Last Year: Never true    Ran Out of Food in the Last Year: Never true  Transportation Needs: No Transportation Needs (02/29/2024)   Epic    Lack of Transportation (Medical): No    Lack of Transportation (Non-Medical): No  Physical Activity: Inactive (10/04/2023)  Exercise Vital Sign    Days of Exercise per Week: 0 days    Minutes of Exercise per Session: 0 min  Stress: No Stress Concern Present (10/04/2023)   [employer] of Occupational Health - Occupational Stress Questionnaire    Feeling of Stress : Not at all  Social Connections: Moderately Isolated (10/04/2023)   Social Connection and Isolation Panel    Frequency of Communication with Friends and Family: Twice a week    Frequency of Social Gatherings with Friends and Family: Twice a week    Attends Religious Services: Never    Database Administrator or Organizations: No    Attends Banker Meetings: Never    Marital Status: Living with partner  Depression (PHQ2-9): Low Risk (07/10/2024)   Depression (PHQ2-9)     PHQ-2 Score: 3  Alcohol Screen: Low Risk (10/04/2023)   Alcohol Screen    Last Alcohol Screening Score (AUDIT): 3  Housing: Low Risk (02/29/2024)   Epic    Unable to Pay for Housing in the Last Year: No    Number of Times Moved in the Last Year: 0    Homeless in the Last Year: No  Utilities: Not At Risk (02/29/2024)   Epic    Threatened with loss of utilities: No  Health Literacy: Adequate Health Literacy (10/04/2023)   B1300 Health Literacy    Frequency of need for help with medical instructions: Never    Allergies[1]  Outpatient Medications Prior to Visit  Medication Sig Dispense Refill   acetaminophen  (TYLENOL ) 325 MG tablet Take 650 mg by mouth every 6 (six) hours as needed.     atorvastatin  (LIPITOR) 20 MG tablet Take 1 tablet (20 mg total) by mouth daily. 90 tablet 3   cetirizine  (ZYRTEC  ALLERGY) 10 MG tablet Take 1 tablet (10 mg total) by mouth daily. 30 tablet 0   FARXIGA  5 MG TABS tablet Take 1 tablet (5 mg total) by mouth daily. 90 tablet 1   ferrous sulfate  325 (65 FE) MG tablet Take 1 tablet (325 mg total) by mouth daily. 100 tablet 1   fluticasone  (FLONASE ) 50 MCG/ACT nasal spray PLACE 2 SPRAYS INTO BOTH NOSTRILS DAILY. 16 g 3   ibuprofen  (ADVIL ) 800 MG tablet Take 1 tablet (800 mg total) by mouth 2 (two) times daily as needed. 60 tablet 1   levocetirizine (XYZAL ) 5 MG tablet Take 1 tablet (5 mg total) by mouth every evening. 90 tablet 1   metFORMIN  (GLUCOPHAGE ) 1000 MG tablet Take 1 tablet (1,000 mg total) by mouth 2 (two) times daily with a meal. 180 tablet 1   Vitamin D , Ergocalciferol , (DRISDOL ) 1.25 MG (50000 UNIT) CAPS capsule Take 1 capsule (50,000 Units total) by mouth every 7 (seven) days. 4 capsule 3   Blood Glucose Monitoring Suppl (TRUE METRIX METER) w/Device KIT 1 each by Does not apply route in the morning and at bedtime. 1 kit 0   cefdinir  (OMNICEF ) 300 MG capsule Take 1 capsule (300 mg total) by mouth 2 (two) times daily. 20 capsule 0   glucose blood (TRUE  METRIX BLOOD GLUCOSE TEST) test strip Use to check blood sugar daily as directed 100 each 3   Meth-Hyo-M Bl-Na Phos-Ph Sal (URO-MP) 118 MG CAPS TAKE 1 CAPSULE BY MOUTH EVERY 8 HOURS AS NEEDED 30 capsule 0   promethazine -dextromethorphan (PROMETHAZINE -DM) 6.25-15 MG/5ML syrup Take 5 mLs by mouth 3 (three) times daily as needed for cough. 200 mL 0   pseudoephedrine  (SUDAFED) 30 MG tablet Take 1  tablet (30 mg total) by mouth every 8 (eight) hours as needed for congestion. 30 tablet 0   No facility-administered medications prior to visit.     ROS Review of Systems  Constitutional:  Negative for activity change and appetite change.  HENT:  Positive for congestion and sinus pain. Negative for sinus pressure and sore throat.   Respiratory:  Negative for chest tightness, shortness of breath and wheezing.   Cardiovascular:  Negative for chest pain and palpitations.  Gastrointestinal:  Negative for abdominal distention, abdominal pain and constipation.  Genitourinary: Negative.   Musculoskeletal: Negative.   Neurological:  Positive for headaches.  Psychiatric/Behavioral:  Negative for behavioral problems and dysphoric mood.     Objective:  BP 118/80   Pulse 90   Temp (!) 97.5 F (36.4 C) (Oral)   Resp 16   Ht 5' 5 (1.651 m)   Wt 213 lb (96.6 kg)   LMP 07/10/2024 (Exact Date)   SpO2 96%   BMI 35.45 kg/m      07/10/2024    1:41 PM 06/26/2024   11:30 AM 04/04/2024    3:02 PM  BP/Weight  Systolic BP 118 130 117  Diastolic BP 80 86 79  Wt. (Lbs) 213  212  BMI 35.45 kg/m2  35.28 kg/m2      Physical Exam Constitutional:      Appearance: She is well-developed.  HENT:     Mouth/Throat:     Pharynx: No posterior oropharyngeal erythema.     Comments: Enlarged tonsils bilaterally Cardiovascular:     Rate and Rhythm: Normal rate.     Heart sounds: Normal heart sounds. No murmur heard. Pulmonary:     Effort: Pulmonary effort is normal.     Breath sounds: Normal breath sounds. No  wheezing or rales.  Chest:     Chest wall: No tenderness.  Abdominal:     General: Bowel sounds are normal. There is no distension.     Palpations: Abdomen is soft. There is no mass.     Tenderness: There is no abdominal tenderness.  Musculoskeletal:        General: Normal range of motion.     Right lower leg: No edema.     Left lower leg: No edema.  Neurological:     Mental Status: She is alert and oriented to person, place, and time.  Psychiatric:        Mood and Affect: Mood normal.        Latest Ref Rng & Units 02/29/2024   11:14 AM 04/12/2023    9:59 AM 01/09/2023   11:39 AM  CMP  Glucose 70 - 99 mg/dL 862  888  897   BUN 6 - 24 mg/dL 4  8  6    Creatinine 0.57 - 1.00 mg/dL 9.43  9.31  9.36   Sodium 134 - 144 mmol/L 141  140  141   Potassium 3.5 - 5.2 mmol/L 4.2  4.7  5.0   Chloride 96 - 106 mmol/L 106  103  103   CO2 20 - 29 mmol/L 23  24  25    Calcium  8.7 - 10.2 mg/dL 8.9  9.2  9.7   Total Protein 6.0 - 8.5 g/dL  6.9    Total Bilirubin 0.0 - 1.2 mg/dL  0.7    Alkaline Phos 44 - 121 IU/L  129    AST 0 - 40 IU/L  39    ALT 0 - 32 IU/L  39  Lipid Panel     Component Value Date/Time   CHOL 153 04/12/2023 0959   TRIG 78 04/12/2023 0959   HDL 77 04/12/2023 0959   CHOLHDL 3.2 09/05/2022 1158   LDLCALC 61 04/12/2023 0959    CBC    Component Value Date/Time   WBC 10.1 04/04/2024 1559   WBC 9.7 07/22/2017 0243   RBC 5.12 04/04/2024 1559   RBC 4.79 07/22/2017 0243   HGB 11.1 04/04/2024 1559   HCT 38.0 04/04/2024 1559   PLT 352 04/04/2024 1559   MCV 74 (L) 04/04/2024 1559   MCH 21.7 (L) 04/04/2024 1559   MCH 25.9 (L) 07/22/2017 0243   MCHC 29.2 (L) 04/04/2024 1559   MCHC 31.5 07/22/2017 0243   RDW 25.4 (H) 04/04/2024 1559   LYMPHSABS 3.8 (H) 04/04/2024 1559   MONOABS 0.9 03/17/2012 2154   EOSABS 0.2 04/04/2024 1559   BASOSABS 0.1 04/04/2024 1559    Lab Results  Component Value Date   HGBA1C 6.2 04/04/2024    Lab Results  Component Value Date    TSH 2.100 11/02/2022      There are no diagnoses linked to this encounter.   Healthcare maintenance ***  Meds ordered this encounter  Medications   ketoconazole  (NIZORAL ) 2 % shampoo    Sig: Apply 1 Application topically 2 (two) times a week.    Dispense:  120 mL    Refill:  0   predniSONE  (DELTASONE ) 20 MG tablet    Sig: Take 1 tablet (20 mg total) by mouth daily with breakfast.    Dispense:  5 tablet    Refill:  0    Follow-up: Return for previously scheduled appointment.       Corrina Sabin, MD, FAAFP. [facility] [place], [place] [phone]   07/10/2024, 2:03 PM     [1]  Allergies Allergen Reactions   Trulicity  [Dulaglutide ] Nausea And Vomiting   Penicillins Rash and Other (See Comments)    Has patient had a PCN reaction causing immediate rash, facial/tongue/throat swelling, SOB or lightheadedness with hypotension: No Has patient had a PCN reaction causing severe rash involving mucus membranes or skin necrosis: No Has patient had a PCN reaction that required hospitalization No Has patient had a PCN reaction occurring within the last 10 years: Yes If all of the above answers are NO, then may proceed with Cephalosporin use.    "

## 2024-07-11 ENCOUNTER — Encounter: Payer: Self-pay | Admitting: Family Medicine

## 2024-07-11 ENCOUNTER — Ambulatory Visit: Payer: Self-pay | Admitting: Family Medicine

## 2024-07-11 LAB — T3: T3, Total: 139 ng/dL (ref 71–180)

## 2024-07-11 LAB — T4, FREE: Free T4: 1.08 ng/dL (ref 0.82–1.77)

## 2024-07-11 LAB — SEDIMENTATION RATE: Sed Rate: 23 mm/h (ref 0–32)

## 2024-07-11 LAB — TSH: TSH: 2.04 u[IU]/mL (ref 0.450–4.500)

## 2024-09-04 ENCOUNTER — Ambulatory Visit: Admitting: Rheumatology

## 2024-10-06 ENCOUNTER — Ambulatory Visit: Admitting: Family Medicine
